# Patient Record
Sex: Female | Born: 1960 | Race: Black or African American | Hispanic: No | Marital: Married | Smoking: Never smoker
Health system: Southern US, Community
[De-identification: ages and names within clinical notes are randomized; demographics above are authoritative.]

## PROBLEM LIST (undated history)

## (undated) DIAGNOSIS — F32A Depression, unspecified: Secondary | ICD-10-CM

## (undated) DIAGNOSIS — E079 Disorder of thyroid, unspecified: Secondary | ICD-10-CM

## (undated) DIAGNOSIS — M1990 Unspecified osteoarthritis, unspecified site: Secondary | ICD-10-CM

## (undated) DIAGNOSIS — R519 Headache, unspecified: Secondary | ICD-10-CM

## (undated) DIAGNOSIS — E785 Hyperlipidemia, unspecified: Secondary | ICD-10-CM

## (undated) DIAGNOSIS — N189 Chronic kidney disease, unspecified: Secondary | ICD-10-CM

## (undated) DIAGNOSIS — R0600 Dyspnea, unspecified: Secondary | ICD-10-CM

## (undated) DIAGNOSIS — E039 Hypothyroidism, unspecified: Secondary | ICD-10-CM

## (undated) DIAGNOSIS — I10 Essential (primary) hypertension: Secondary | ICD-10-CM

## (undated) DIAGNOSIS — I82409 Acute embolism and thrombosis of unspecified deep veins of unspecified lower extremity: Secondary | ICD-10-CM

## (undated) DIAGNOSIS — O24419 Gestational diabetes mellitus in pregnancy, unspecified control: Secondary | ICD-10-CM

## (undated) DIAGNOSIS — H35039 Hypertensive retinopathy, unspecified eye: Secondary | ICD-10-CM

## (undated) DIAGNOSIS — D649 Anemia, unspecified: Secondary | ICD-10-CM

## (undated) DIAGNOSIS — D219 Benign neoplasm of connective and other soft tissue, unspecified: Secondary | ICD-10-CM

## (undated) HISTORY — DX: Benign neoplasm of connective and other soft tissue, unspecified: D21.9

## (undated) HISTORY — DX: Essential (primary) hypertension: I10

## (undated) HISTORY — DX: Hypertensive retinopathy, unspecified eye: H35.039

## (undated) HISTORY — DX: Hyperlipidemia, unspecified: E78.5

## (undated) HISTORY — PX: TUBAL LIGATION: SHX77

## (undated) HISTORY — DX: Gestational diabetes mellitus in pregnancy, unspecified control: O24.419

## (undated) HISTORY — DX: Disorder of thyroid, unspecified: E07.9

## (undated) HISTORY — PX: MYOMECTOMY: SHX85

## (undated) HISTORY — DX: Acute embolism and thrombosis of unspecified deep veins of unspecified lower extremity: I82.409

## (undated) HISTORY — DX: Anemia, unspecified: D64.9

## (undated) HISTORY — DX: Unspecified osteoarthritis, unspecified site: M19.90

---

## 1998-07-31 ENCOUNTER — Other Ambulatory Visit: Admission: RE | Admit: 1998-07-31 | Discharge: 1998-07-31 | Payer: Self-pay | Admitting: Obstetrics & Gynecology

## 1999-08-20 ENCOUNTER — Other Ambulatory Visit: Admission: RE | Admit: 1999-08-20 | Discharge: 1999-08-20 | Payer: Self-pay | Admitting: Obstetrics & Gynecology

## 1999-11-16 ENCOUNTER — Emergency Department (HOSPITAL_COMMUNITY): Admission: EM | Admit: 1999-11-16 | Discharge: 1999-11-16 | Payer: Self-pay | Admitting: Emergency Medicine

## 1999-11-24 ENCOUNTER — Encounter: Payer: Self-pay | Admitting: Family Medicine

## 1999-11-24 ENCOUNTER — Ambulatory Visit (HOSPITAL_COMMUNITY): Admission: RE | Admit: 1999-11-24 | Discharge: 1999-11-24 | Payer: Self-pay | Admitting: Family Medicine

## 2000-11-15 ENCOUNTER — Other Ambulatory Visit: Admission: RE | Admit: 2000-11-15 | Discharge: 2000-11-15 | Payer: Self-pay | Admitting: Obstetrics & Gynecology

## 2001-11-22 ENCOUNTER — Other Ambulatory Visit: Admission: RE | Admit: 2001-11-22 | Discharge: 2001-11-22 | Payer: Self-pay | Admitting: Obstetrics & Gynecology

## 2003-07-31 ENCOUNTER — Other Ambulatory Visit: Admission: RE | Admit: 2003-07-31 | Discharge: 2003-07-31 | Payer: Self-pay | Admitting: Obstetrics & Gynecology

## 2004-01-22 ENCOUNTER — Other Ambulatory Visit: Admission: RE | Admit: 2004-01-22 | Discharge: 2004-01-22 | Payer: Self-pay | Admitting: Obstetrics & Gynecology

## 2004-09-05 ENCOUNTER — Other Ambulatory Visit: Admission: RE | Admit: 2004-09-05 | Discharge: 2004-09-05 | Payer: Self-pay | Admitting: Obstetrics & Gynecology

## 2005-05-09 ENCOUNTER — Inpatient Hospital Stay (HOSPITAL_COMMUNITY): Admission: EM | Admit: 2005-05-09 | Discharge: 2005-05-11 | Payer: Self-pay | Admitting: Emergency Medicine

## 2005-06-15 DIAGNOSIS — I82409 Acute embolism and thrombosis of unspecified deep veins of unspecified lower extremity: Secondary | ICD-10-CM

## 2005-06-15 HISTORY — DX: Acute embolism and thrombosis of unspecified deep veins of unspecified lower extremity: I82.409

## 2005-09-01 ENCOUNTER — Encounter: Admission: RE | Admit: 2005-09-01 | Discharge: 2005-09-01 | Payer: Self-pay | Admitting: Internal Medicine

## 2006-06-23 ENCOUNTER — Ambulatory Visit (HOSPITAL_COMMUNITY): Admission: RE | Admit: 2006-06-23 | Discharge: 2006-06-23 | Payer: Self-pay | Admitting: Obstetrics & Gynecology

## 2010-10-06 ENCOUNTER — Other Ambulatory Visit (HOSPITAL_COMMUNITY)
Admission: RE | Admit: 2010-10-06 | Discharge: 2010-10-06 | Disposition: A | Discharge status: Home / Self Care | Payer: Self-pay | Source: Ambulatory Visit | Attending: Family Medicine | Admitting: Family Medicine

## 2010-10-06 ENCOUNTER — Encounter: Payer: Self-pay | Admitting: Family Medicine

## 2010-10-06 ENCOUNTER — Other Ambulatory Visit: Payer: Self-pay | Admitting: Family Medicine

## 2010-10-06 ENCOUNTER — Encounter (INDEPENDENT_AMBULATORY_CARE_PROVIDER_SITE_OTHER): Payer: Self-pay | Admitting: Physician Assistant

## 2010-10-06 ENCOUNTER — Other Ambulatory Visit: Payer: Self-pay | Admitting: Physician Assistant

## 2010-10-06 DIAGNOSIS — R87619 Unspecified abnormal cytological findings in specimens from cervix uteri: Secondary | ICD-10-CM | POA: Insufficient documentation

## 2010-10-06 DIAGNOSIS — N879 Dysplasia of cervix uteri, unspecified: Secondary | ICD-10-CM

## 2010-10-06 LAB — POCT PREGNANCY, URINE: Preg Test, Ur: NEGATIVE

## 2010-10-07 NOTE — Group Therapy Note (Signed)
NAMEWILENE, Heather Bailey                   ACCOUNT NO.:  [id number]  MEDICAL RECORD NO.:  [id number]           PATIENT TYPE:  A  LOCATION:  [facility]                   FACILITY:  [facility]  PHYSICIAN:  Tinnie Gens, MD        DATE OF BIRTH:  08-01-1960  DATE OF SERVICE:                                 CLINIC NOTE  CHIEF COMPLAINT:  Cervical mass.  HISTORY OF PRESENT ILLNESS:  The patient is 50 year old gravida 2, para 2-0-0-2 who was [facility].  At that time, she was found to have a cervical mass that was felt to need a further followup and biopsy.  The patient has a remote history of abnormal Pap smear that may or may not have included removal of some part of her cervix.  Otherwise, she has been without complaints.  She is perimenopausal.  She is having hot flashes and it has been 8 months since her last period.  PAST MEDICAL HISTORY: 1. Arthritis. 2. Anemia. 3. Hypertension. 4. History of DVT/PE. 5. Hypothyroidism.  PAST SURGICAL HISTORY:  The patient has undergone C-section, BTL, Novasure ablation, and another procedure to remove fibroids.  I am unclear if this was done through abdominal incision or if she had a hysteroscopic removal.  MEDICATIONS: 1. Levothyroxine 0.75 mg p.o. daily. 2. Bisoprolol 5 mg 1 p.o. daily. 3. Centrum multivitamin 1 p.o. daily. 4. 5-HTP 100 mg p.o. daily.  ALLERGIES: 1. HOT DOGS. 2. ONIONS. 3. CABBAGE. 4. BACON.  OBSTETRICAL HISTORY:  She is a G2, P2 with one C section with tubal ligation in 1997.  GYNECOLOGICAL HISTORY:  Menarche at age 57.  Cycles are regular, are irregular now, it has been over 8 months since her last period.  She has had severe pain and heavy cycles most of her life that tapered off after her Novasure in 2009.  She does have a history of ovarian cyst and fibroids and some procedure to treat abnormal Pap smear.  SOCIAL HISTORY:  She does not smoke, she does not do drugs, or any other alcohol.  She  lives with her husband and two sons.  She has recently been unemployed.  FAMILY HISTORY:  Significant for heart disease in her father who died of heart attack.  Hypertension in her mother and sister.  Her mother died of ovarian cancer.  She and her sister both have a history of blood clots.  REVIEW OF SYSTEMS:  Fourteen point review of systems was reviewed. Please see GYN history in the chart.  Positive for weight gain, menopausal symptoms, and hot flashes which has been going on for approximately 4-5 years, depression, anxiety, muscle and joint pain, leg cramps, and painful intercourse.  PHYSICAL EXAMINATION:  VITAL SIGNS:  Her vitals are as noted in the chart.  Her weight is 280 pounds, blood pressure 124/93. GENERAL:  She is a well-developed, well-nourished female in no acute distress. HEENT:  Normocephalic and atraumatic.  Sclerae anicteric. NECK:  Supple.  Normal thyroid. LUNGS:  Respirations appear normal. HEART:  Rate is regular. ABDOMEN:  Soft.  PROCEDURE:  The patient was placed in  dorsal lithotomy and speculum was placed inside the vagina.  Cervix was visualized.  There was a very large ectropion noted.  There was a nabothian cyst at approximately 10 o'clock and 8 o'clock.  There are finger-like projections, erythematous, polypoid looking, and structure of this looks different than everything else.  A colposcopy was performed by applying acetic acid to the cervix. There were multiple places of acetowhite changes and some abnormal blood vessels.  Biopsy was done in all 4 quadrants to include the mass.  There was very little bleeding with good tissue sampling done as the cervix was very hard, like it is potentially scarred.  ECC was performed. Monsel was used to obtain hemostasis.  Please note, also that the patient's Pap smear at [facility] was normal.  IMPRESSION:  Cervical mass of unclear etiology.  PLAN:  Biopsies today with cold blade.  She will  follow up in 2 weeks for a planned followup in 2 weeks for path.          ______________________________ Tinnie Gens, MD    TP/MEDQ  D:  10/06/2010  T:  10/07/2010  Job:  [id number]

## 2010-10-20 ENCOUNTER — Other Ambulatory Visit (HOSPITAL_COMMUNITY): Payer: Self-pay | Admitting: Internal Medicine

## 2010-10-20 DIAGNOSIS — Z1231 Encounter for screening mammogram for malignant neoplasm of breast: Secondary | ICD-10-CM

## 2010-10-24 ENCOUNTER — Ambulatory Visit: Payer: Self-pay | Admitting: Family Medicine

## 2010-10-24 ENCOUNTER — Ambulatory Visit (HOSPITAL_COMMUNITY)
Admission: RE | Admit: 2010-10-24 | Discharge: 2010-10-24 | Disposition: A | Discharge status: Home / Self Care | Payer: Self-pay | Source: Ambulatory Visit | Attending: Internal Medicine | Admitting: Internal Medicine

## 2010-10-24 DIAGNOSIS — Z1231 Encounter for screening mammogram for malignant neoplasm of breast: Secondary | ICD-10-CM

## 2011-06-28 ENCOUNTER — Ambulatory Visit: Payer: Self-pay

## 2011-06-28 DIAGNOSIS — M545 Low back pain, unspecified: Secondary | ICD-10-CM

## 2011-08-18 ENCOUNTER — Ambulatory Visit (INDEPENDENT_AMBULATORY_CARE_PROVIDER_SITE_OTHER): Payer: Self-pay | Admitting: *Deleted

## 2011-08-18 VITALS — BP 123/84 | HR 70 | Temp 97.9°F | Ht 68.0 in | Wt 206.1 lb

## 2011-08-18 DIAGNOSIS — Z01419 Encounter for gynecological examination (general) (routine) without abnormal findings: Secondary | ICD-10-CM

## 2011-08-18 NOTE — Patient Instructions (Signed)
Taught patient how to perform BSE and gave educational materials to take home. Patient did not need a mammogram today for has not been 1 year since last mammogram. Informed patient to call me to schedule her yearly mammogram. Told her that her next mammogram is due 10/25/11. Let patient know will follow up with her within the next couple weeks with results by letter or phone. Patient verbalized understanding.

## 2011-08-18 NOTE — Progress Notes (Signed)
No complaints today.  Pap Smear:    Completed Pap smear today. Last Pap smear was 1 year at one of the free Pap smear screenings at the [facility]. Last Pap smear was abnormal requiring a colposcopy and biopsy for follow up. Per patient has had another abnormal Pap smear years ago. No Pap smear results in EPIC.  Physical exam: Breasts Breasts symmetrical. No skin abnormalities bilateral breasts. No nipple retraction bilateral breasts. No nipple discharge bilateral breasts. No lymphadenopathy. No lumps palpated bilateral breasts. No complaints of pain or tenderness on palpation.          Pelvic/Bimanual   Ext Genitalia No lesions, no swelling and no discharge observed on external genitalia.         Vagina Vagina pink and normal texture. No lesions or discharge observed in vagina.          Cervix Cervix is present. Cervix is rough and yellowish appearing. No discharge observed at cervical os.          Uterus Uterus is present and palpable. Uterus in normal position and normal size.       Adnexae Bilateral ovaries present and palpable. No tenderness on palpation.        Rectovaginal No rectal exam completed today since patient had no rectal complaints. No skin abnormalities observed at rectal area.

## 2011-08-26 ENCOUNTER — Encounter: Payer: Self-pay | Admitting: Obstetrics and Gynecology

## 2011-10-20 ENCOUNTER — Telehealth: Payer: Self-pay

## 2011-10-20 NOTE — Telephone Encounter (Signed)
Called pt and left message to return our call to Christine Brannock @ [phone].  

## 2011-10-23 ENCOUNTER — Telehealth: Payer: Self-pay | Admitting: *Deleted

## 2011-10-23 NOTE — Telephone Encounter (Signed)
Telephoned patient's home # and left message.

## 2011-10-28 ENCOUNTER — Telehealth: Payer: Self-pay | Admitting: *Deleted

## 2011-10-28 ENCOUNTER — Encounter: Payer: Self-pay | Admitting: Obstetrics and Gynecology

## 2011-10-28 NOTE — Telephone Encounter (Signed)
Patient returned call and left voicemail. Attempted to call patient back to remind her to schedule her mammogram. No one answered phone. This is third attempt to call patient. Will send certified letter.

## 2011-10-30 ENCOUNTER — Telehealth: Payer: Self-pay | Admitting: *Deleted

## 2011-10-30 ENCOUNTER — Other Ambulatory Visit: Payer: Self-pay | Admitting: Obstetrics and Gynecology

## 2011-10-30 DIAGNOSIS — Z1231 Encounter for screening mammogram for malignant neoplasm of breast: Secondary | ICD-10-CM

## 2011-10-30 NOTE — Telephone Encounter (Signed)
Patient returned phone call and left me voicemail. Called patient back and let her know she needs to schedule a screening mammogram. [facility] mammography and scheduled mammogram for patient per request. Appointment is Thursday, November 26, 2011 at 1600. Called back back and gave her appointment. Let patient know to show her pink [facility] card when comes to appointment and that [facility] covers the mammogram. Patient verbalized understanding.

## 2011-11-26 ENCOUNTER — Ambulatory Visit (HOSPITAL_COMMUNITY)
Admission: RE | Admit: 2011-11-26 | Discharge: 2011-11-26 | Disposition: A | Discharge status: Home / Self Care | Payer: Self-pay | Source: Ambulatory Visit | Attending: Obstetrics and Gynecology | Admitting: Obstetrics and Gynecology

## 2011-11-26 DIAGNOSIS — Z1231 Encounter for screening mammogram for malignant neoplasm of breast: Secondary | ICD-10-CM

## 2012-03-09 ENCOUNTER — Telehealth (HOSPITAL_COMMUNITY): Payer: Self-pay | Admitting: *Deleted

## 2012-03-09 NOTE — Telephone Encounter (Signed)
Patient called me and left voicemail late yesterday afternoon in regards to a [facility] qeustion. Called patient back and no one answered phone. Left voicemail for patient to call me back.

## 2012-11-14 ENCOUNTER — Other Ambulatory Visit: Payer: Self-pay | Admitting: Obstetrics and Gynecology

## 2012-11-14 DIAGNOSIS — Z1231 Encounter for screening mammogram for malignant neoplasm of breast: Secondary | ICD-10-CM

## 2012-11-29 ENCOUNTER — Ambulatory Visit (HOSPITAL_COMMUNITY)
Admission: RE | Admit: 2012-11-29 | Discharge: 2012-11-29 | Disposition: A | Discharge status: Home / Self Care | Payer: Self-pay | Source: Ambulatory Visit | Attending: Obstetrics and Gynecology | Admitting: Obstetrics and Gynecology

## 2012-11-29 ENCOUNTER — Encounter (HOSPITAL_COMMUNITY): Payer: Self-pay

## 2012-11-29 VITALS — BP 110/78 | Temp 98.1°F | Ht 67.0 in | Wt 195.2 lb

## 2012-11-29 DIAGNOSIS — Z01419 Encounter for gynecological examination (general) (routine) without abnormal findings: Secondary | ICD-10-CM

## 2012-11-29 DIAGNOSIS — Z1231 Encounter for screening mammogram for malignant neoplasm of breast: Secondary | ICD-10-CM

## 2012-11-29 NOTE — Patient Instructions (Signed)
Taught Geoffrey Mankin how to perform BSE and gave educational materials to take home. Due to patients history of abnormal Pap smears depending on today's result will determine when next Pap smear is due.  Let patient know will follow up with her within the next couple weeks with results by letter or phone for mammogram and Pap smear. Heather Bailey verbalized understanding. Patient escorted to mammography for a screening mammogram.  Monzerat Handler, Kathaleen Maser, RN 12:14 PM

## 2012-11-29 NOTE — Addendum Note (Signed)
Encounter addended by: Saintclair Halsted, RN on: 11/29/2012 12:43 PM<BR>     Documentation filed: Visit Diagnoses

## 2012-11-29 NOTE — Progress Notes (Signed)
No complaints today.  Pap Smear:    Completed Pap smear today. Last Pap smear was 08/18/2011 at [facility] and normal. Patient had an abnormal Pap smear 10/06/2010 that required a colposcopy for follow up. Per patient has had around 3 other abnormal Pap smears prior to the abnormal Pap smear in 2012. Patient stated she has a history of cryo and coloposcopy prior to last abnormal Pap smear. Last Pap smear result is in EPIC.  Physical exam: Breasts Breasts symmetrical. No skin abnormalities bilateral breasts. No nipple retraction bilateral breasts. No nipple discharge bilateral breasts. No lymphadenopathy. No lumps palpated bilateral breasts. No complaints of pain or tenderness on exam. Patient escorted to mammography for a screening mammogram.          Pelvic/Bimanual   Ext Genitalia No lesions, no swelling and no discharge observed on external genitalia.         Vagina Vagina pink and normal texture. No lesions or discharge observed in vagina.          Cervix Cervix is present. Cervix tilted to the right. Cervix is red, bumpy and irregular around cervical os, ? HPV. No discharge observed on cervix.       Uterus Uterus is present and palpable. Uterus is enlarged and positioned to the right. Patient stated she has a history of uterine fibroids.    Adnexae Bilateral ovaries present and unable to palpate. No tenderness on palpation.        Rectovaginal No rectal exam completed today since patient had no rectal complaints. No skin abnormalities observed on exam.

## 2012-12-05 ENCOUNTER — Telehealth (HOSPITAL_COMMUNITY): Payer: Self-pay | Admitting: *Deleted

## 2012-12-05 NOTE — Telephone Encounter (Signed)
Telephoned patient at home # and discussed results of negative pap smear and next pap due in 3 years. Patient voiced understanding

## 2013-03-10 ENCOUNTER — Emergency Department (HOSPITAL_COMMUNITY)
Admission: EM | Admit: 2013-03-10 | Discharge: 2013-03-10 | Disposition: A | Discharge status: Home / Self Care | Payer: Self-pay | Attending: Emergency Medicine | Admitting: Emergency Medicine

## 2013-03-10 ENCOUNTER — Emergency Department (HOSPITAL_COMMUNITY): Payer: Self-pay

## 2013-03-10 ENCOUNTER — Encounter (HOSPITAL_COMMUNITY): Payer: Self-pay | Admitting: Emergency Medicine

## 2013-03-10 DIAGNOSIS — R079 Chest pain, unspecified: Secondary | ICD-10-CM

## 2013-03-10 DIAGNOSIS — R071 Chest pain on breathing: Secondary | ICD-10-CM | POA: Insufficient documentation

## 2013-03-10 DIAGNOSIS — R51 Headache: Secondary | ICD-10-CM | POA: Insufficient documentation

## 2013-03-10 DIAGNOSIS — Z8742 Personal history of other diseases of the female genital tract: Secondary | ICD-10-CM | POA: Insufficient documentation

## 2013-03-10 DIAGNOSIS — Z862 Personal history of diseases of the blood and blood-forming organs and certain disorders involving the immune mechanism: Secondary | ICD-10-CM | POA: Insufficient documentation

## 2013-03-10 DIAGNOSIS — E785 Hyperlipidemia, unspecified: Secondary | ICD-10-CM | POA: Insufficient documentation

## 2013-03-10 DIAGNOSIS — M436 Torticollis: Secondary | ICD-10-CM | POA: Insufficient documentation

## 2013-03-10 DIAGNOSIS — I10 Essential (primary) hypertension: Secondary | ICD-10-CM | POA: Insufficient documentation

## 2013-03-10 DIAGNOSIS — Z79899 Other long term (current) drug therapy: Secondary | ICD-10-CM | POA: Insufficient documentation

## 2013-03-10 DIAGNOSIS — Z86718 Personal history of other venous thrombosis and embolism: Secondary | ICD-10-CM | POA: Insufficient documentation

## 2013-03-10 DIAGNOSIS — E079 Disorder of thyroid, unspecified: Secondary | ICD-10-CM | POA: Insufficient documentation

## 2013-03-10 DIAGNOSIS — Z8739 Personal history of other diseases of the musculoskeletal system and connective tissue: Secondary | ICD-10-CM | POA: Insufficient documentation

## 2013-03-10 LAB — CBC
HCT: 35.2 % — ABNORMAL LOW (ref 36.0–46.0)
Hemoglobin: 11.7 g/dL — ABNORMAL LOW (ref 12.0–15.0)
MCH: 29.3 pg (ref 26.0–34.0)
MCHC: 33.2 g/dL (ref 30.0–36.0)
MCV: 88.2 fL (ref 78.0–100.0)
Platelets: 248 10*3/uL (ref 150–400)
RBC: 3.99 MIL/uL (ref 3.87–5.11)
RDW: 13.5 % (ref 11.5–15.5)
WBC: 6.6 10*3/uL (ref 4.0–10.5)

## 2013-03-10 LAB — BASIC METABOLIC PANEL
BUN: 14 mg/dL (ref 6–23)
CO2: 27 mEq/L (ref 19–32)
Calcium: 9.5 mg/dL (ref 8.4–10.5)
Chloride: 103 mEq/L (ref 96–112)
Creatinine, Ser: 1.16 mg/dL — ABNORMAL HIGH (ref 0.50–1.10)
GFR calc Af Amer: 62 mL/min — ABNORMAL LOW (ref >90)
GFR calc non Af Amer: 53 mL/min — ABNORMAL LOW (ref >90)
Glucose, Bld: 89 mg/dL (ref 70–99)
Potassium: 4.2 mEq/L (ref 3.5–5.1)
Sodium: 140 mEq/L (ref 135–145)

## 2013-03-10 LAB — POCT I-STAT TROPONIN I: Troponin i, poc: 0 ng/mL (ref 0.00–0.08)

## 2013-03-10 LAB — D-DIMER, QUANTITATIVE: D-Dimer, Quant: 0.44 ug/mL-FEU (ref 0.00–0.48)

## 2013-03-10 MED ORDER — NITROGLYCERIN 0.4 MG SL SUBL: 0.4000 mg | SUBLINGUAL_TABLET | SUBLINGUAL | Status: DC | PRN

## 2013-03-10 MED ORDER — ASPIRIN 325 MG PO TABS
325.0000 mg | ORAL_TABLET | ORAL | Status: AC
  Administered 2013-03-10: 325 mg via ORAL
  Filled 2013-03-10: qty 1

## 2013-03-10 MED ORDER — SODIUM CHLORIDE 0.9 % IV SOLN
Freq: Once | INTRAVENOUS | Status: AC
  Administered 2013-03-10: 21:00:00 via INTRAVENOUS

## 2013-03-10 MED ORDER — KETOROLAC TROMETHAMINE 30 MG/ML IJ SOLN
30.0000 mg | Freq: Once | INTRAMUSCULAR | Status: AC
  Administered 2013-03-10: 30 mg via INTRAVENOUS
  Filled 2013-03-10: qty 1

## 2013-03-10 MED ORDER — ONDANSETRON HCL 4 MG/2ML IJ SOLN
4.0000 mg | Freq: Once | INTRAMUSCULAR | Status: AC
  Administered 2013-03-10: 4 mg via INTRAVENOUS
  Filled 2013-03-10: qty 2

## 2013-03-10 MED ORDER — HYDROCODONE-ACETAMINOPHEN 7.5-325 MG/15ML PO SOLN: 15.0000 mL | Freq: Four times a day (QID) | ORAL | Status: AC | PRN

## 2013-03-10 NOTE — ED Notes (Signed)
X-Ray at bedside.

## 2013-03-10 NOTE — ED Notes (Signed)
MD at bedside. Dr. Lockwood at bedside.  

## 2013-03-10 NOTE — ED Provider Notes (Signed)
CSN: [id number]     Arrival date & time 03/10/13  1905 History   First MD Initiated Contact with Patient 03/10/13 2000     Chief Complaint  Patient presents with  . Chest Pain  . Torticollis  . Headache   (Consider location/radiation/quality/duration/timing/severity/associated sxs/prior Treatment) HPI Patient presents with concern chest pain and headache, nausea, sore throat. Symptoms began yesterday, without clear precipitant.  Initially there was left sided chest pressure.  Since onset the pressure has been persistent.  The aforementioned other symptoms have developed gradually.  There is subjective fever, generalized discomfort, but no vomiting, no diarrhea, no objective fever. Patient denies lower extremity changes, but endorses a history of DVT in the distant past.  She states that this is her primary concern. No relief with OTC medication. Past Medical History  Diagnosis Date  . Thyroid disease   . Hypertension   . Arthritis   . Anemia   . Hyperlipidemia   . DVT (deep venous thrombosis)   . Fibroids    Past Surgical History  Procedure Laterality Date  . Cesarean section    . Myomectomy    . Tubal ligation     Family History  Problem Relation Age of Onset  . Heart disease Father   . Hypertension Mother   . Cancer Mother     colon/cervical   History  Substance Use Topics  . Smoking status: Never Smoker   . Smokeless tobacco: Never Used  . Alcohol Use: No   OB History   Grav Para Term Preterm Abortions TAB SAB Ect Mult Living   2 2 2       2      Review of Systems  Constitutional:       Per HPI, otherwise negative  HENT:       Per HPI, otherwise negative  Respiratory:       Per HPI, otherwise negative  Cardiovascular:       Per HPI, otherwise negative  Gastrointestinal: Negative for vomiting.  Endocrine:       Negative aside from HPI  Genitourinary:       Neg aside from HPI   Musculoskeletal:       Per HPI, otherwise negative  Skin: Negative.    Neurological: Negative for syncope.    Allergies  Zithromax  Home Medications   Current Outpatient Rx  Name  Route  Sig  Dispense  Refill  . bisoprolol (ZEBETA) 5 MG tablet   Oral   Take 5 mg by mouth daily.         Marland Kitchen glucosamine-chondroitin 500-400 MG tablet   Oral   Take 3 tablets by mouth daily.         Marland Kitchen ibuprofen (ADVIL,MOTRIN) 200 MG tablet   Oral   Take 200 mg by mouth every 6 (six) hours as needed for pain.         Marland Kitchen levothyroxine (SYNTHROID, LEVOTHROID) 50 MCG tablet   Oral   Take 50 mcg by mouth daily.         . pseudoephedrine (SUDAFED) 30 MG tablet   Oral   Take 30 mg by mouth every 4 (four) hours as needed for congestion.         . sertraline (ZOLOFT) 50 MG tablet   Oral   Take 50 mg by mouth daily.          BP 132/79  Pulse 76  Temp(Src) 99.8 F (37.7 C) (Oral)  Resp 18  Ht 5\' 7"  (1.702  m)  Wt 185 lb (83.915 kg)  BMI 28.97 kg/m2  SpO2 99% Physical Exam  Nursing note and vitals reviewed. Constitutional: She is oriented to person, place, and time. She appears well-developed and well-nourished. No distress.  HENT:  Head: Normocephalic and atraumatic.  Mouth/Throat: Uvula is midline, oropharynx is clear and moist and mucous membranes are normal.  Eyes: Conjunctivae and EOM are normal.  Cardiovascular: Normal rate and regular rhythm.   Pulmonary/Chest: Effort normal and breath sounds normal. No stridor. No respiratory distress.  Abdominal: She exhibits no distension.  Musculoskeletal: She exhibits no edema.  Lymphadenopathy:       Head (right side): No submental, no submandibular, no preauricular, no posterior auricular and no occipital adenopathy present.       Head (left side): No submental, no submandibular, no preauricular, no posterior auricular and no occipital adenopathy present.       Right cervical: No superficial cervical adenopathy present.      Left cervical: No superficial cervical adenopathy present.  Neurological: She  is alert and oriented to person, place, and time. No cranial nerve deficit.  Skin: Skin is warm and dry.  Psychiatric: She has a normal mood and affect.    ED Course  Procedures (including critical care time) Labs Review Labs Reviewed  CBC - Abnormal; Notable for the following:    Hemoglobin 11.7 (*)    HCT 35.2 (*)    All other components within normal limits  BASIC METABOLIC PANEL  D-DIMER, QUANTITATIVE  POCT I-STAT TROPONIN I   Imaging Review Dg Chest Port 1 View  03/10/2013   CLINICAL DATA:  Patient having chest tightness since yesterday, worsening today, radiating to both arms and to the neck with associated nausea, diaphoresis and lightheadedness.  EXAM: PORTABLE CHEST - 1 VIEW  COMPARISON:  05/09/2005  FINDINGS: The heart size and mediastinal contours are within normal limits. Both lungs are clear. The visualized skeletal structures are unremarkable.  IMPRESSION: No active disease.   Electronically Signed   By: Amie Portland   On: 03/10/2013 19:58   Pulse oximetry 100% room air normal Cardiac 75 sinus rhythm normal EKG has a sinus rhythm, rate 80, unremarkable   10:54 PM On repeat exam the patient appears calm.  I discussed all results with her and her husband.  Given the duration of symptoms, her negative troponin likely reflects the absence of ongoing coronary ischemia. MDM  No diagnosis found. Patient presents with one day of chest discomfort, headache, neck discomfort, cough, subjective fever.  Patient's description of symptoms is consistent with viral etiology, though with her history, labs, x-ray, were performed.  Results were largely reassuring, including a negative d-dimer, unremarkable troponin.  Given these findings, the patient's absence of hypoxia, tachypnea, tachycardia, there is low suspicion for PE, ACS, occult pneumonia. I had a lengthy discussion with the patient and her husband about home care, followup instructions and return precautions.    Gerhard Munch, MD 03/10/13 2256

## 2013-03-10 NOTE — ED Notes (Signed)
Pt states pain to L chest is worse with mvmt and inspiration. Pt states she is not having chest pain now, but her neck hurts. Pt states she had shortness of breath earlier, but it is better now. Pt states nausea from earlier is also improved.

## 2013-03-10 NOTE — ED Notes (Addendum)
Pt states that she began having chest tightness yesterday and it has gotten worse today. Pt c/o left chest pain that radiates to both arms and the neck. Pt also c/o nausea, lightheadedness, diaphoresis, and headache accompanied by severe neck pain and stiffness. Pt states she has a hx of blood clots and had the same s/s the last time.

## 2013-11-02 ENCOUNTER — Other Ambulatory Visit (HOSPITAL_COMMUNITY): Payer: Self-pay | Admitting: Internal Medicine

## 2014-01-18 ENCOUNTER — Other Ambulatory Visit: Payer: Self-pay | Admitting: Obstetrics and Gynecology

## 2014-01-18 DIAGNOSIS — Z1231 Encounter for screening mammogram for malignant neoplasm of breast: Secondary | ICD-10-CM

## 2014-02-13 ENCOUNTER — Ambulatory Visit (HOSPITAL_COMMUNITY)
Admission: RE | Admit: 2014-02-13 | Discharge: 2014-02-13 | Disposition: A | Discharge status: Home / Self Care | Payer: Self-pay | Source: Ambulatory Visit | Attending: Obstetrics and Gynecology | Admitting: Obstetrics and Gynecology

## 2014-02-13 ENCOUNTER — Encounter (HOSPITAL_COMMUNITY): Payer: Self-pay

## 2014-02-13 VITALS — BP 118/84 | Temp 98.4°F | Ht 67.0 in | Wt 213.0 lb

## 2014-02-13 DIAGNOSIS — Z1231 Encounter for screening mammogram for malignant neoplasm of breast: Secondary | ICD-10-CM

## 2014-02-13 DIAGNOSIS — Z01419 Encounter for gynecological examination (general) (routine) without abnormal findings: Secondary | ICD-10-CM

## 2014-02-13 NOTE — Patient Instructions (Signed)
Explained to Heather Bailey how to perform BSE due to patients history of abnormal Pap smears depending on today's result will determine when next Pap smear is due. Let patient know will follow up with her within the next couple weeks with results by letter or phone for mammogram and Pap smear. Heather Bailey verbalized understanding. Patient escorted to mammography for a screening mammogram.   Arham Symmonds, Arvil Chaco, RN

## 2014-02-13 NOTE — Progress Notes (Signed)
No complaints today.   Pap Smear: Completed Pap smear today. Last Pap smear was 11/29/2012 at [facility] and normal. Patients prior Pap smear on 08/18/2011 at [facility] was normal. Patient had an abnormal Pap smear in 2012 that required a colposcopy for follow up 10/06/2010. Per patient has had around 3 other abnormal Pap smears prior to the abnormal Pap smear in 2012. Patient stated she has a history of cryo and coloposcopy prior for last abnormal Pap smear. Last Pap smear result is in EPIC.   Physical exam:  Breasts  Breasts symmetrical. No skin abnormalities bilateral breasts. No nipple retraction bilateral breasts. No nipple discharge bilateral breasts. No lymphadenopathy. No lumps palpated bilateral breasts. No complaints of pain or tenderness on exam. Patient escorted to mammography for a screening mammogram.   Pelvic/Bimanual  Ext Genitalia  No lesions, no swelling and no discharge observed on external genitalia.   Vagina  Vagina pink and normal texture. No lesions or discharge observed in vagina.   Cervix  Cervix is present. Cervix tilted to the right. Cervix is red, bumpy and irregular around cervical os, ? HPV. No discharge observed on cervix.   Uterus  Uterus is present and palpable. Uterus is enlarged and positioned to the right. Patient stated she has a history of uterine fibroids.   Adnexae  Bilateral ovaries present and unable to palpate. No tenderness on palpation.   Rectovaginal  No rectal exam completed today since patient had no rectal complaints. No skin abnormalities observed on exam.

## 2014-02-13 NOTE — Addendum Note (Signed)
Encounter addended by: Loletta Parish, RN on: 02/13/2014 10:49 AM<BR>     Documentation filed: Visit Diagnoses

## 2014-02-14 LAB — CYTOLOGY - PAP

## 2014-02-16 ENCOUNTER — Telehealth (HOSPITAL_COMMUNITY): Payer: Self-pay | Admitting: *Deleted

## 2014-02-16 NOTE — Telephone Encounter (Signed)
Telephoned patient at home # and discussed negative pap smear results. Next pap due in 3 years. Patient voiced understanding.

## 2014-03-02 ENCOUNTER — Ambulatory Visit: Payer: Self-pay

## 2014-03-09 ENCOUNTER — Ambulatory Visit (HOSPITAL_BASED_OUTPATIENT_CLINIC_OR_DEPARTMENT_OTHER): Payer: Self-pay

## 2014-03-09 ENCOUNTER — Other Ambulatory Visit: Payer: Self-pay

## 2014-03-09 VITALS — BP 119/74 | HR 66 | Temp 98.2°F | Resp 16 | Ht 66.0 in | Wt 215.7 lb

## 2014-03-09 DIAGNOSIS — Z0000 Encounter for general adult medical examination without abnormal findings: Secondary | ICD-10-CM

## 2014-03-09 LAB — HEMOGLOBIN A1C
HEMOGLOBIN A1C: 6.5 % — AB (ref <5.7)
MEAN PLASMA GLUCOSE: 140 mg/dL — AB (ref <117)

## 2014-03-09 LAB — LIPID PANEL
CHOL/HDL RATIO: 5.8 ratio
Cholesterol: 222 mg/dL — ABNORMAL HIGH (ref 0–200)
HDL: 38 mg/dL — ABNORMAL LOW (ref >39)
LDL CALC: 155 mg/dL — AB (ref 0–99)
Triglycerides: 146 mg/dL (ref <150)
VLDL: 29 mg/dL (ref 0–40)

## 2014-03-09 LAB — GLUCOSE (CC13): Glucose: 83 mg/dl (ref 70–140)

## 2014-03-09 NOTE — Progress Notes (Signed)
Patient is a new patient to the [facility] program and is currently a [facility] patient effective 02/13/2014.   Clinical Measurements: Patient is 5 ft. 6 inches, weight 215.7 lbs, waist circumference 39 inches, and hip circumference 44.5 inches.   Medical History: Patient has no history of high cholesterol or diabetes.Patient does have a history of hypertension. She is presently on  5 mg bisoprolol everyday.  Per patient no diagnosed history of coronary heart disease, heart attack, heart failure, stroke/TIA, vascular disease or congenital heart defects. Per patient has had chest pain secondary to multiple Pulmonary emboli.  Blood Pressure, Self-measurement: Patient states that does check blood pressure on occasion and does share results with doctor.  Nutrition Assessment: Patient stated that eats 1 fruit per month but states she loves fruit. Patient states she eats one servings of vegetables a day. Per patient eats 3 or more ounces of whole grains daily. Patient doesn't eat two or more servings of fish weekly. Patient states she does drink more than 36 ounces or 450 calories of beverages with added sugars weekly. Patient stated she does watch her salt intake.   Physical Activity Assessment: Patient states that walks up and down stairs and around house for approximately 420 minutes per week and does no vigorous exercise.  Smoking Status: Patient had never smoked and is not around smoke.  Quality of Life Assessment: In assessing patient's quality of life she stated that out of the past 30 days that she has felt her Physical health was not good all 30 days. Patient also stated that in the past 30 days that her mental health is not good including stress, depression and problems with emotions for 10 days. Patient did state that out of the past 30 days she felt her physical or mental health had not kept her from doing her usual activities including self-care, work or recreation.   Plan: Lab work will be done  today including a lipid panel, blood glucose, and Hgb A1C. Will call lab results when they are finished. Will discuss programs when lab results back and get appointment [id number] for Hypertension.

## 2014-03-09 NOTE — Patient Instructions (Signed)
Discussed health assessment with patient.. Will decide on programs she wants when call lab results. Let patient know that will call her on Wednesday with lab results and make appointment at [facility] for hypertension. Informed not to let Health and wellness do any labs or procedures until goes to eligibility and get orange card or patient assistance. Patient verbalized understanding.

## 2014-03-12 ENCOUNTER — Telehealth: Payer: Self-pay

## 2014-03-12 NOTE — Telephone Encounter (Signed)
Called to inform about lab work from 01/26/14. Informed patient: cholesterol- 222, HDL- 38, LDL- 146, triglycerides - 163, Bld Glucose -83 and HBG-A1C - 6.5.  Patient will call back for Health Coaching with [facility]. Informed patient of doctor's appointment at [facility] on Monday, October 5th at 4:15 PM. Address and directions given.

## 2014-03-19 ENCOUNTER — Encounter: Payer: Self-pay | Admitting: Family Medicine

## 2014-03-19 ENCOUNTER — Ambulatory Visit: Payer: Self-pay | Attending: Family Medicine | Admitting: Family Medicine

## 2014-03-19 VITALS — BP 109/75 | HR 71 | Temp 97.8°F | Resp 18 | Ht 66.0 in | Wt 213.0 lb

## 2014-03-19 DIAGNOSIS — I10 Essential (primary) hypertension: Secondary | ICD-10-CM | POA: Insufficient documentation

## 2014-03-19 DIAGNOSIS — R739 Hyperglycemia, unspecified: Secondary | ICD-10-CM

## 2014-03-19 DIAGNOSIS — F329 Major depressive disorder, single episode, unspecified: Secondary | ICD-10-CM

## 2014-03-19 DIAGNOSIS — Z23 Encounter for immunization: Secondary | ICD-10-CM

## 2014-03-19 DIAGNOSIS — F32A Depression, unspecified: Secondary | ICD-10-CM | POA: Insufficient documentation

## 2014-03-19 DIAGNOSIS — E119 Type 2 diabetes mellitus without complications: Secondary | ICD-10-CM | POA: Insufficient documentation

## 2014-03-19 DIAGNOSIS — E785 Hyperlipidemia, unspecified: Secondary | ICD-10-CM | POA: Insufficient documentation

## 2014-03-19 DIAGNOSIS — E039 Hypothyroidism, unspecified: Secondary | ICD-10-CM | POA: Insufficient documentation

## 2014-03-19 LAB — TSH: TSH: 1.798 u[IU]/mL (ref 0.350–4.500)

## 2014-03-19 LAB — COMPLETE METABOLIC PANEL WITH GFR
ALT: 16 U/L (ref 0–35)
AST: 15 U/L (ref 0–37)
Albumin: 4.7 g/dL (ref 3.5–5.2)
Alkaline Phosphatase: 78 U/L (ref 39–117)
BILIRUBIN TOTAL: 0.3 mg/dL (ref 0.2–1.2)
BUN: 16 mg/dL (ref 6–23)
CALCIUM: 10.2 mg/dL (ref 8.4–10.5)
CHLORIDE: 105 meq/L (ref 96–112)
CO2: 28 meq/L (ref 19–32)
CREATININE: 1.06 mg/dL (ref 0.50–1.10)
GFR, EST AFRICAN AMERICAN: 69 mL/min
GFR, EST NON AFRICAN AMERICAN: 60 mL/min
Glucose, Bld: 88 mg/dL (ref 70–99)
Potassium: 4.9 mEq/L (ref 3.5–5.3)
Sodium: 139 mEq/L (ref 135–145)
Total Protein: 7.7 g/dL (ref 6.0–8.3)

## 2014-03-19 LAB — GLUCOSE, POCT (MANUAL RESULT ENTRY): POC GLUCOSE: 139 mg/dL — AB (ref 70–99)

## 2014-03-19 MED ORDER — METFORMIN HCL ER 500 MG PO TB24: 1000.0000 mg | ORAL_TABLET | Freq: Every day | ORAL | Status: DC

## 2014-03-19 MED ORDER — ATORVASTATIN CALCIUM 20 MG PO TABS: 20.0000 mg | ORAL_TABLET | Freq: Every day | ORAL | Status: DC

## 2014-03-19 MED ORDER — LEVOTHYROXINE SODIUM 50 MCG PO TABS: 50.0000 ug | ORAL_TABLET | Freq: Every day | ORAL | Status: DC

## 2014-03-19 MED ORDER — SERTRALINE HCL 50 MG PO TABS: 50.0000 mg | ORAL_TABLET | Freq: Every day | ORAL | Status: DC

## 2014-03-19 MED ORDER — BISOPROLOL FUMARATE 5 MG PO TABS: 5.0000 mg | ORAL_TABLET | Freq: Every day | ORAL | Status: DC

## 2014-03-19 NOTE — Assessment & Plan Note (Signed)
A: BP at goal Meds complaint P: Continue current regimen

## 2014-03-19 NOTE — Assessment & Plan Note (Signed)
Check TSH. Refilled synthroid.

## 2014-03-19 NOTE — Assessment & Plan Note (Addendum)
A: newly dx P: Metformin Exercise  Low carb diet  Weight loss, goal 5 # in 3 months F//u in 3 months  Foot exam done today, urine ACR

## 2014-03-19 NOTE — Progress Notes (Signed)
   Subjective:    Patient ID: Heather Bailey, female    DOB: May 17, 1961, 53 y.o.   MRN: [id number]  HPI 53 yo F presents to establish care and discuss the following:  1. DM2: dx in 02/2014. Husband with DM2. Personal history of gestational DM2. Does not exercise. Skin darkening, especially under eyes.   2. HTN: taking BP. No CP, SOB, LE edema.   3. Hypothyroidism: taking synthroid. No palpitations. No edema.   Soc Hx: non smoker  Review of Systems As per HPI  GAD 7: core of 10, 1-2,5,6, 7. 2-1,3,4.     Objective:   Physical Exam BP 109/75  Pulse 71  Temp(Src) 97.8 F (36.6 C) (Oral)  Resp 18  Ht 5\' 6"  (1.676 m)  Wt 213 lb (96.616 kg)  BMI 34.40 kg/m2  SpO2 98% General appearance: alert, cooperative and no distress Lungs: normal WOB  Extremities: extremities normal, atraumatic, no cyanosis. Trace edema   Lab Results  Component Value Date   HGBA1C 6.5* 03/09/2014         Assessment & Plan:

## 2014-03-19 NOTE — Patient Instructions (Addendum)
Heather Bailey,  Thank you for coming in today. It was a pleasure meeting you. I look forward to being your primary doctor.   1. For hypertension: continue bisoprolol.   2. DM2: metformin 1000 mg with breakfast. Regular exercise. Low carb diet.   3. Depression: refill zoloft.   F/u in 3 months.   Dr. Adrian Blackwater

## 2014-03-19 NOTE — Progress Notes (Signed)
Establish Care F/u wise  Woman plan

## 2014-03-19 NOTE — Assessment & Plan Note (Signed)
Start statin lipitor 20

## 2014-03-20 ENCOUNTER — Telehealth: Payer: Self-pay | Admitting: *Deleted

## 2014-03-20 LAB — MICROALBUMIN / CREATININE URINE RATIO
Creatinine, Urine: 219 mg/dL
MICROALB UR: 0.9 mg/dL (ref <2.0)
Microalb Creat Ratio: 4.1 mg/g (ref 0.0–30.0)

## 2014-03-20 NOTE — Telephone Encounter (Signed)
Left message with lab results, if any question return call

## 2014-03-20 NOTE — Telephone Encounter (Signed)
Message copied by Betti Cruz on Tue Mar 20, 2014  3:32 PM ------      Message from: Boykin Nearing      Created: Tue Mar 20, 2014 12:06 PM       Normal TSH, continue current synthroid dose.      Normal CMP and urine albumin creatinine ratio ------

## 2014-04-12 ENCOUNTER — Ambulatory Visit: Payer: Self-pay

## 2014-04-12 DIAGNOSIS — Z789 Other specified health status: Secondary | ICD-10-CM

## 2014-04-12 NOTE — Progress Notes (Signed)
Patient returns today for Health Coaching regarding Nutrition for her diabetic range A1C, Lipid Panel  and activity.Marland Kitchen   NUTRITION: Patient had appointment [facility] and [facility] on October 5. [facility] started patient on Lipitor 20 mg every day and Metformin 500 mg every day. Patient stated that they did not go over anything,did not give Glucose meter and did give some information. Patient has not started cutting back on carbohydrates and sugary food products. Discussed watching carbohydrates, how to count them, serving sizes and number of carbs per day allowance. Patient received and reviewed the following handouts: A1C, Counting Carbohydrates, and Carb counting Menu, 1600 calorie meal plan and my plate example for diabetics. Gave patient measuring cup to measure serving sizes and demonstrated the serving sizes. We discussed all handouts.Patient would like to use [employer] Plan.Patient and I went over assessments and how to set goals in [employer]. Patient wants to weigh in monthly. Today's weight 208.7 pounds.Patient and I went over cookbook that patient received.  .ACTIVITY: Discussed walking and different exercises. Patient received and reviewed Act[employer] and  Exercise by [employer].  Miscellaneous: Discussed eligibility appointment that was changed to next week . Marland Kitchen  PLAN:  Pursue eligibility at [place] To monitor the amount of walking she does per day.Decrease carbohydrates and sugars in diet.

## 2014-04-13 ENCOUNTER — Ambulatory Visit: Payer: Self-pay

## 2014-04-13 NOTE — Patient Instructions (Signed)
Patient will follow 1600 calorie diet plan. Will review all handouts and exercise/activity book. Will increase fiber in diet. Will decrease Carbohydrates to less than 200 per day. Will increase exercise. Will measure portion sizes. Will call if has any questions. Will call patient in three weeks. Patient verbalized understanding.

## 2014-04-16 ENCOUNTER — Encounter: Payer: Self-pay | Admitting: Family Medicine

## 2014-05-02 ENCOUNTER — Ambulatory Visit: Payer: Self-pay

## 2014-05-07 ENCOUNTER — Telehealth (HOSPITAL_COMMUNITY): Payer: Self-pay | Admitting: *Deleted

## 2014-05-07 NOTE — Telephone Encounter (Signed)
Attempted to call patient to discuss the bills she left for me for her [place] visit. No one answered phone. Left voicemail for patient to call me back.

## 2014-05-08 ENCOUNTER — Telehealth: Payer: Self-pay | Admitting: *Deleted

## 2014-05-08 NOTE — Telephone Encounter (Signed)
Patient called me back. Explained to patient that [place] will cover the physician bill sent except it doesn't cover the flu vaccine. Encouraged patient to apply for the orange card. Patient verbalized understanding.

## 2014-05-22 ENCOUNTER — Telehealth: Payer: Self-pay

## 2014-05-22 NOTE — Telephone Encounter (Signed)
Called to see how doing with [employer] Program. Patient stated that had had some problems with Thanksgiving. We discussed some desserts that could make that were low to no sugar and lower carbohydrates for holidays. Discussed program for about 20 minutes. Will call if has any problems. Discussed medications and programs that were available at no cost. Discussed looking and doing chair exercises in [employer]. Patient stated was using treadmill at home and when son home for Christmas plan on going to track to walk.

## 2014-06-01 ENCOUNTER — Ambulatory Visit: Payer: Self-pay

## 2014-06-01 ENCOUNTER — Ambulatory Visit: Payer: Self-pay | Attending: Family Medicine

## 2014-06-11 ENCOUNTER — Other Ambulatory Visit: Payer: Self-pay | Admitting: *Deleted

## 2014-06-11 DIAGNOSIS — E039 Hypothyroidism, unspecified: Secondary | ICD-10-CM

## 2014-06-11 MED ORDER — LEVOTHYROXINE SODIUM 50 MCG PO TABS: 50.0000 ug | ORAL_TABLET | Freq: Every day | ORAL | Status: DC

## 2014-07-13 ENCOUNTER — Telehealth: Payer: Self-pay

## 2014-07-13 NOTE — Telephone Encounter (Signed)
Called patient to follow up for second [place]. Patient stated that had just started getting back on track by exercising and eating better. Patient stated that needed to come in and get weighed but would wait. Discussed that patient was still getting a bill for $198 from [facility] for Tx room cost. Asked patient to bring bill to [employer].  PLAN: Patient will return for weight check.

## 2014-07-17 ENCOUNTER — Telehealth: Payer: Self-pay

## 2014-07-17 NOTE — Telephone Encounter (Signed)
Patient stated that doing good with [employer]. Patient stated that was turned down for patient assistance. Is concerned about medication and her new diagnosed diabetes.  Her eyes are bothering her. Discussed that I would check in to medication assistance and would call back [facility] to see if they can give any guidance.

## 2014-08-28 ENCOUNTER — Ambulatory Visit: Payer: Self-pay | Attending: Family Medicine | Admitting: Family Medicine

## 2014-08-28 ENCOUNTER — Encounter: Payer: Self-pay | Admitting: Family Medicine

## 2014-08-28 VITALS — BP 99/67 | HR 89 | Temp 98.5°F | Resp 18 | Ht 66.0 in | Wt 208.0 lb

## 2014-08-28 DIAGNOSIS — Z114 Encounter for screening for human immunodeficiency virus [HIV]: Secondary | ICD-10-CM

## 2014-08-28 DIAGNOSIS — Z0000 Encounter for general adult medical examination without abnormal findings: Secondary | ICD-10-CM

## 2014-08-28 DIAGNOSIS — H04123 Dry eye syndrome of bilateral lacrimal glands: Secondary | ICD-10-CM | POA: Insufficient documentation

## 2014-08-28 DIAGNOSIS — E119 Type 2 diabetes mellitus without complications: Secondary | ICD-10-CM | POA: Insufficient documentation

## 2014-08-28 LAB — POCT GLYCOSYLATED HEMOGLOBIN (HGB A1C): HEMOGLOBIN A1C: 6.2

## 2014-08-28 LAB — GLUCOSE, POCT (MANUAL RESULT ENTRY): POC GLUCOSE: 179 mg/dL — AB (ref 70–99)

## 2014-08-28 MED ORDER — CYCLOSPORINE 0.05 % OP EMUL: 1.0000 [drp] | Freq: Two times a day (BID) | OPHTHALMIC | Status: DC

## 2014-08-28 MED ORDER — GLUCOSE BLOOD VI STRP: 1.0000 | ORAL_STRIP | Freq: Three times a day (TID) | Status: DC

## 2014-08-28 MED ORDER — TRUERESULT BLOOD GLUCOSE W/DEVICE KIT: 1.0000 | PACK | Freq: Three times a day (TID) | Status: DC

## 2014-08-28 MED ORDER — TRUEPLUS LANCETS 28G MISC: 1.0000 | Freq: Three times a day (TID) | Status: DC

## 2014-08-28 NOTE — Progress Notes (Signed)
   Subjective:    Patient ID: Heather Bailey, female    DOB: 1960/08/25, 54 y.o.   MRN: [id number] CC: DM2 f/u  HPI CHRONIC DIABETES  Disease Monitoring  Blood Sugar Ranges: not checking   Polyuria: no   Visual problems: yes, chronic dry eye and blurred vision at times   Medication Compliance: yes  Medication Side Effects  Hypoglycemia: no   Preventitive Health Care  Eye Exam: due, uninsured   Foot Exam: done   Diet pattern: regular   Exercise: moderate   Review of Systems     Objective:   Physical Exam BP 99/67 mmHg  Pulse 89  Temp(Src) 98.5 F (36.9 C)  Resp 18  Ht 5\' 6"  (1.676 m)  Wt 208 lb (94.348 kg)  BMI 33.59 kg/m2  SpO2 98% General appearance: alert, cooperative, no distress and morbidly obese Lungs: clear to auscultation bilaterally Heart: regular rate and rhythm, S1, S2 normal, no murmur, click, rub or gallop Extremities: extremities normal, atraumatic, no cyanosis or edema Lab Results  Component Value Date   HGBA1C 6.5* 03/09/2014  CBG 179     Assessment & Plan:

## 2014-08-28 NOTE — Patient Instructions (Addendum)
Heather Bailey,  Thank you for coming in today.  1. DM2:  Goal A1c < 7 Your A1c is the same as last time. Diabetes  Check blood sugar day fasting and 2 hrs after meals 2-3 times per day.  Goal fasting 100  Goal after eating < 160 Beware of hypoglycemia (low blood sugar) which is blood sugar < 70 with or without symptoms  Due for diabetic eye exam Low out cost optometrist (about $65.00 for office visit)  1. Dr. Thurston Hole Phone # [phone] [address]  [place], [place] [address]   2. [facility]  Phone # [phone] [address], [place] [address]    2. Chronic dry eyes: restasis paperwork will be faxed today.   F/u in 6 months for DM2 evaluation  Dr. Adrian Blackwater

## 2014-08-28 NOTE — Progress Notes (Signed)
F/U DM  Been out of medication x3 day

## 2014-08-28 NOTE — Assessment & Plan Note (Signed)
Screening HIV today  

## 2014-08-28 NOTE — Assessment & Plan Note (Addendum)
Due for screening colonoscopy. GI referral placed.

## 2014-08-28 NOTE — Assessment & Plan Note (Signed)
Chronic dry eyes: restasis paperwork will be faxed today.

## 2014-08-28 NOTE — Assessment & Plan Note (Signed)
1. DM2:  Goal A1c < 7 Your A1c is the same as last time. Diabetes  Check blood sugar day fasting and 2 hrs after meals 2-3 times per day.  Goal fasting 100  Goal after eating < 160 Beware of hypoglycemia (low blood sugar) which is blood sugar < 70 with or without symptoms  Due for diabetic eye exam Low out cost optometrist (about $65.00 for office visit)  1. Dr. Thurston Hole Phone # [phone] [address]  [place], [place] [address]   2. [facility]  Phone # [phone] [address] [place], [place] [address]

## 2014-08-29 LAB — HIV ANTIBODY (ROUTINE TESTING W REFLEX): HIV 1&2 Ab, 4th Generation: NONREACTIVE

## 2014-08-30 ENCOUNTER — Telehealth: Payer: Self-pay | Admitting: *Deleted

## 2014-08-30 NOTE — Telephone Encounter (Signed)
-----   Message from Boykin Nearing, MD sent at 08/29/2014  9:53 AM EDT ----- Screening HIV negative

## 2014-08-30 NOTE — Telephone Encounter (Signed)
Pt aware of lab results 

## 2014-09-10 ENCOUNTER — Telehealth: Payer: Self-pay | Admitting: Family Medicine

## 2014-09-10 DIAGNOSIS — H04123 Dry eye syndrome of bilateral lacrimal glands: Secondary | ICD-10-CM

## 2014-09-10 MED ORDER — CYCLOSPORINE 0.05 % OP EMUL: 1.0000 [drp] | Freq: Two times a day (BID) | OPHTHALMIC | Status: DC

## 2014-09-10 NOTE — Telephone Encounter (Signed)
Med assist rejected application for restasis. Sent in Rx for restasis. Patient will need to make an appt for PASS.

## 2014-09-12 ENCOUNTER — Encounter: Payer: Self-pay | Admitting: Family Medicine

## 2014-09-14 NOTE — Telephone Encounter (Signed)
Mrs. Bandel,  Unfortunately, I do not have the form. The form was faxed the day of your visit. After faxing, forms are usually securely shredded to protect patient information, and  not routinely saved and scanned unless specifically requested. I did not request it to be scanned. Scanning forms is most often done for medical records. I am sorry that it was not saved as you are in need of it now. I did receive a fax backed denial, so the form was faxed to the correct location. I did inform the pharmacy of the denial when I received it, and the the medication assistance (PASS) coordinator,  will work through the [employer] program to get restasis or a similar product for you.   Thank you,   Dr. Adrian Blackwater

## 2014-09-25 ENCOUNTER — Other Ambulatory Visit: Payer: Self-pay | Admitting: Family Medicine

## 2014-09-26 ENCOUNTER — Other Ambulatory Visit: Payer: Self-pay | Admitting: *Deleted

## 2014-09-26 DIAGNOSIS — E119 Type 2 diabetes mellitus without complications: Secondary | ICD-10-CM

## 2014-09-26 MED ORDER — ATORVASTATIN CALCIUM 20 MG PO TABS: 20.0000 mg | ORAL_TABLET | Freq: Every day | ORAL | Status: DC

## 2014-10-01 ENCOUNTER — Encounter: Payer: Self-pay | Admitting: Family Medicine

## 2014-10-01 ENCOUNTER — Other Ambulatory Visit: Payer: Self-pay | Admitting: Family Medicine

## 2014-10-02 ENCOUNTER — Other Ambulatory Visit: Payer: Self-pay | Admitting: Family Medicine

## 2014-10-02 DIAGNOSIS — E119 Type 2 diabetes mellitus without complications: Secondary | ICD-10-CM

## 2014-10-02 MED ORDER — METFORMIN HCL ER 500 MG PO TB24: 1000.0000 mg | ORAL_TABLET | Freq: Every day | ORAL | Status: DC

## 2014-10-15 ENCOUNTER — Encounter: Payer: Self-pay | Admitting: Internal Medicine

## 2014-10-28 IMAGING — CR DG CHEST 1V PORT
1 series · 1 of 1 positions shown · non-contrast
Comparison: 05/09/2005

CLINICAL DATA: Patient having chest tightness since yesterday,
worsening today, radiating to both arms and to the neck with
associated nausea, diaphoresis and lightheadedness.

EXAM:
PORTABLE CHEST - 1 VIEW

[AP]
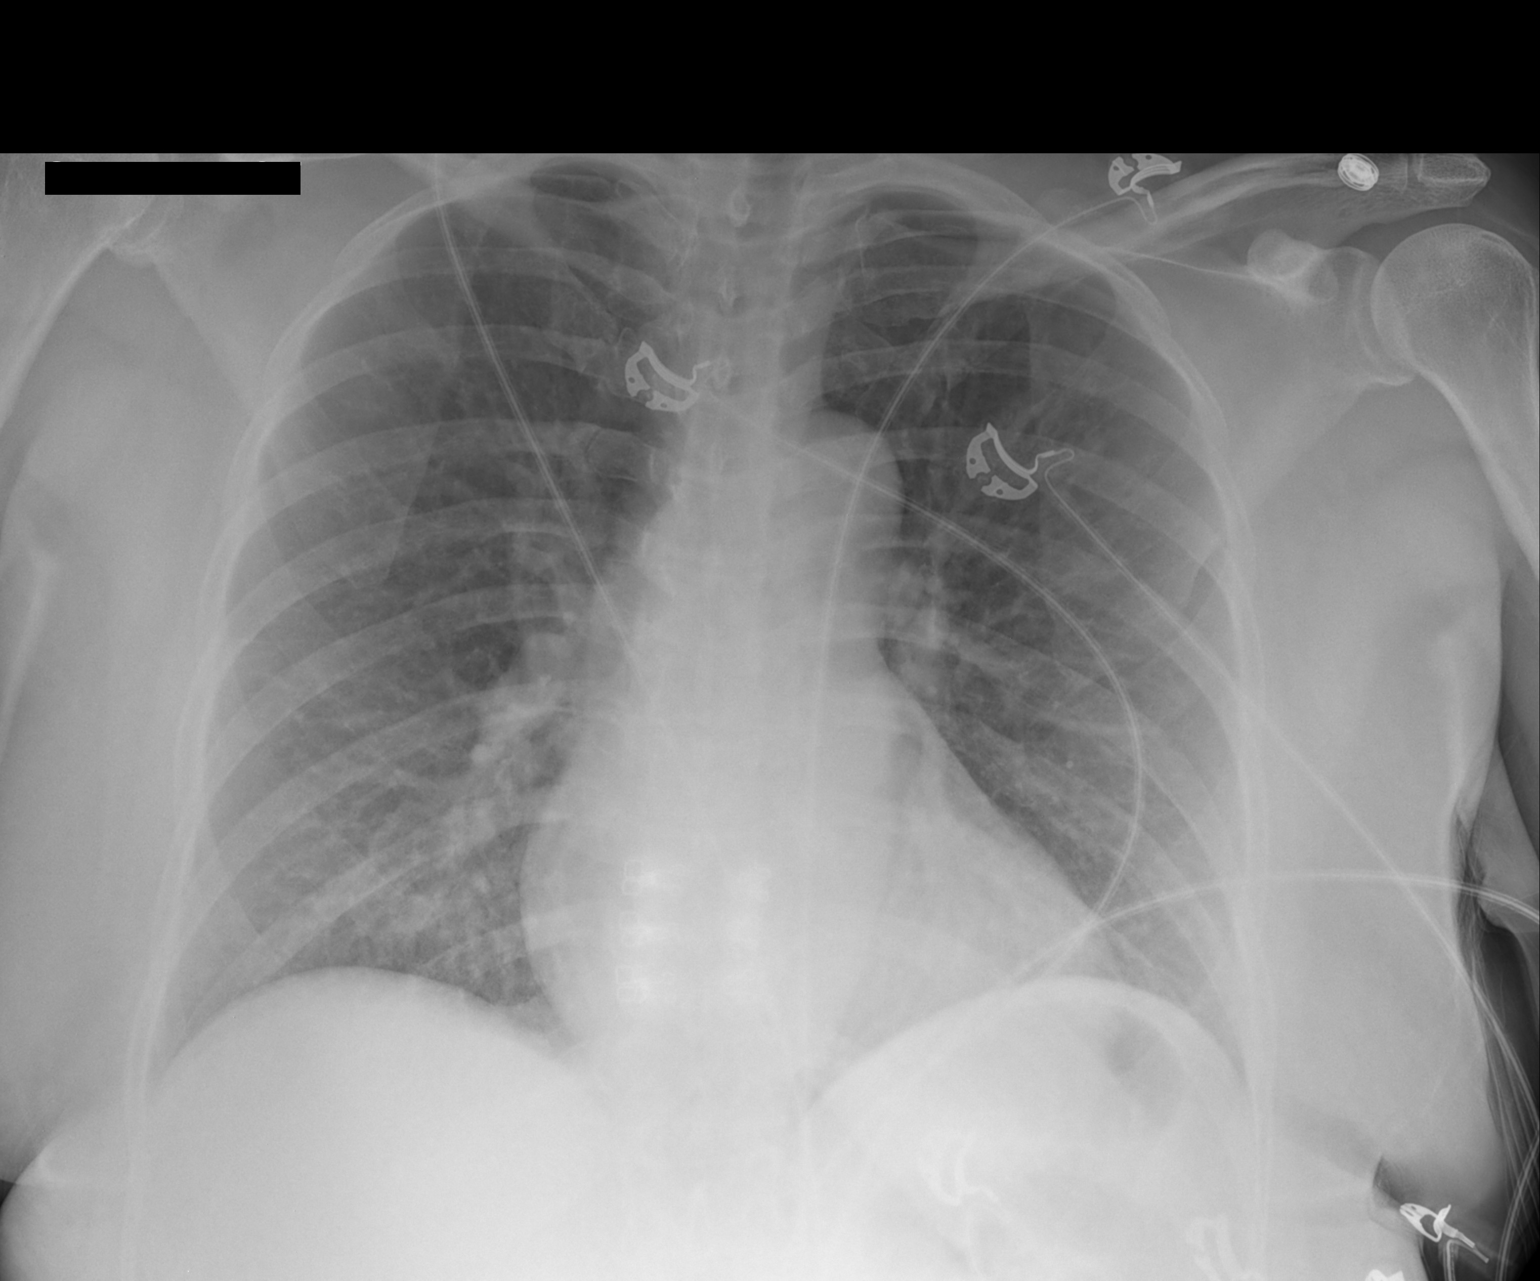

[1 of 1 positions shown; findings below may reference images not displayed]

FINDINGS: The heart size and mediastinal contours are within normal limits.
Both lungs are clear. The visualized skeletal structures are
unremarkable.
IMPRESSION: No active disease.

## 2015-03-06 ENCOUNTER — Encounter: Payer: Self-pay | Admitting: Family Medicine

## 2015-03-11 ENCOUNTER — Other Ambulatory Visit: Payer: Self-pay | Admitting: Family Medicine

## 2015-03-11 DIAGNOSIS — I10 Essential (primary) hypertension: Secondary | ICD-10-CM

## 2015-03-13 NOTE — Telephone Encounter (Signed)
Patient called to request a med refill for her blood pressure medication. Please f/u with pt. °

## 2015-03-14 NOTE — Telephone Encounter (Signed)
Nurse called patient, patient verified date of birth. Patient needs Zebeta filled. Patient has sent an email for prescription and explains pharmacy reports sending 6 or more requests for medication. Nurse sent prescription to pharmacy and apologized to patient for any inconveniences. Patient voices understanding and has no further questions at this time.

## 2015-03-14 NOTE — Telephone Encounter (Signed)
Patient called to request a med refill for her blood pressure medication, please f/u with pt.

## 2015-04-09 ENCOUNTER — Other Ambulatory Visit: Payer: Self-pay | Admitting: Family Medicine

## 2015-04-10 ENCOUNTER — Other Ambulatory Visit: Payer: Self-pay

## 2015-04-10 DIAGNOSIS — F32A Depression, unspecified: Secondary | ICD-10-CM

## 2015-04-10 DIAGNOSIS — E785 Hyperlipidemia, unspecified: Secondary | ICD-10-CM

## 2015-04-10 DIAGNOSIS — F329 Major depressive disorder, single episode, unspecified: Secondary | ICD-10-CM

## 2015-04-10 MED ORDER — ATORVASTATIN CALCIUM 20 MG PO TABS: 20.0000 mg | ORAL_TABLET | Freq: Every day | ORAL | Status: DC

## 2015-04-10 NOTE — Telephone Encounter (Signed)
Nurse called patient at home and mobile numbers, reached voicemail. Left message, on both voice mails,  for patient to call Nira Conn with [facility], at [phone]. Nurse called patient to make patient aware of 1 month supply of atorvastatin sent to pharmacy. Patient needs appointment before additional refills.  Refill request for sertraline sent to provider for approval.

## 2015-04-10 NOTE — Telephone Encounter (Signed)
Patient returned call to nurse.  Patient aware of atorvastatin, 1 month supply, to pharmacy. Patient aware of need for appointment before getting additional refills.  Nurse transferred patient to front office to schedule appointment tomorrow at 5pm. Patient aware of sertraline request sent to provider for approval.

## 2015-04-11 ENCOUNTER — Ambulatory Visit: Payer: Self-pay | Admitting: Family Medicine

## 2015-04-12 ENCOUNTER — Other Ambulatory Visit: Payer: Self-pay | Admitting: *Deleted

## 2015-04-12 ENCOUNTER — Ambulatory Visit: Payer: Self-pay | Attending: Family Medicine | Admitting: Family Medicine

## 2015-04-12 ENCOUNTER — Encounter: Payer: Self-pay | Admitting: Family Medicine

## 2015-04-12 VITALS — BP 111/75 | HR 67 | Temp 98.0°F | Resp 18 | Ht 67.0 in | Wt 204.0 lb

## 2015-04-12 DIAGNOSIS — M545 Low back pain, unspecified: Secondary | ICD-10-CM | POA: Insufficient documentation

## 2015-04-12 DIAGNOSIS — E039 Hypothyroidism, unspecified: Secondary | ICD-10-CM | POA: Insufficient documentation

## 2015-04-12 DIAGNOSIS — G8929 Other chronic pain: Secondary | ICD-10-CM | POA: Insufficient documentation

## 2015-04-12 DIAGNOSIS — E119 Type 2 diabetes mellitus without complications: Secondary | ICD-10-CM

## 2015-04-12 DIAGNOSIS — L853 Xerosis cutis: Secondary | ICD-10-CM | POA: Insufficient documentation

## 2015-04-12 DIAGNOSIS — F329 Major depressive disorder, single episode, unspecified: Secondary | ICD-10-CM | POA: Insufficient documentation

## 2015-04-12 DIAGNOSIS — F32A Depression, unspecified: Secondary | ICD-10-CM

## 2015-04-12 LAB — POCT GLYCOSYLATED HEMOGLOBIN (HGB A1C): HEMOGLOBIN A1C: 6

## 2015-04-12 LAB — TSH: TSH: 4.38 u[IU]/mL (ref 0.350–4.500)

## 2015-04-12 LAB — GLUCOSE, POCT (MANUAL RESULT ENTRY): POC GLUCOSE: 101 mg/dL — AB (ref 70–99)

## 2015-04-12 MED ORDER — SERTRALINE HCL 50 MG PO TABS: 50.0000 mg | ORAL_TABLET | Freq: Every day | ORAL | Status: DC

## 2015-04-12 MED ORDER — GABAPENTIN 300 MG PO CAPS: 300.0000 mg | ORAL_CAPSULE | Freq: Every day | ORAL | Status: DC

## 2015-04-12 NOTE — Progress Notes (Signed)
Patient here for 6 month F/U  Patient complains of back pain, present for a while due to an overdue surgery for buldging disk.

## 2015-04-12 NOTE — Patient Instructions (Addendum)
Mrs. Heather, Bailey job with weight loss and diabetes control.   Heather Bailey was seen today for diabetes and back pain.  Diagnoses and all orders for this visit:  Type 2 diabetes mellitus without complication, without long-term current use of insulin (HCC) -     Glucose (CBG) -     POCT A1C -     Microalbumin/Creatinine Ratio, Urine  Hypothyroidism, unspecified hypothyroidism type -     TSH  Chronic low back pain -     gabapentin (NEURONTIN) 300 MG capsule; Take 1 capsule (300 mg total) by mouth at bedtime.  Depression -     sertraline (ZOLOFT) 50 MG tablet; Take 1 tablet (50 mg total) by mouth daily.   Adding gabapentin for low back after one week increase to 1 pill twice a day if you notice improvement in pain and it is not making you too sleepy.   For dry skin try the aqua glycolic line of skin products I recommend the toner and face cream   Please apply for [place] discount and orange card, you can also inquire if any of your medications are on the PASS (medications assistance) list.   F/u in 2 months for chronic low back pain   Dr. Adrian Blackwater

## 2015-04-12 NOTE — Assessment & Plan Note (Signed)
For dry skin try the aqua glycolic line of skin products I recommend the toner and face cream

## 2015-04-12 NOTE — Progress Notes (Signed)
Patient ID: Heather Bailey, female   DOB: [birth year], 54 y.o.   MRN: [id number]   Subjective:  Patient ID: Heather Bailey, female    DOB: Sep 18, 1960  Age: 54 y.o. MRN: [id number]  CC: Diabetes and Back Pain   HPI Heather Bailey presents for   1. CHRONIC DIABETES  Disease Monitoring  Blood Sugar Ranges: normal   Polyuria: no   Visual problems: no   Medication Compliance: yes  Medication Side Effects  Hypoglycemia: no   Preventitive Health Care  Eye Exam: due   Foot Exam: done today   Diet pattern:   Exercise: minimal to moderate   2. Back pain: x 10 years. Always severe. Aleve makes it better. Acupuncture helped in the past. Bilateral low back. Gets down to hips. No weakness in legs. No fecal or urinary incontinence.   3. Depression: she is out of zoloft. Requesting refills. Denies SI. Is anxious at times about not being able to find work.   4. Dry skin: dry skin on face. Also with dark spots under eyes. She questions if dryness is from diabetes. She uses oil of olay face cream. She does not sleep well due to back and hip pain.   Social History  Substance Use Topics  . Smoking status: Never Smoker   . Smokeless tobacco: Never Used  . Alcohol Use: No   Outpatient Prescriptions Prior to Visit  Medication Sig Dispense Refill  . atorvastatin (LIPITOR) 20 MG tablet Take 1 tablet (20 mg total) by mouth daily. 30 tablet 0  . bisoprolol (ZEBETA) 5 MG tablet TAKE 1 TABLET BY MOUTH DAILY. 30 tablet 5  . Blood Glucose Monitoring Suppl (TRUERESULT BLOOD GLUCOSE) W/DEVICE KIT 1 each by Does not apply route 3 (three) times daily before meals. 1 each 0  . cycloSPORINE (RESTASIS) 0.05 % ophthalmic emulsion Place 1 drop into both eyes 2 (two) times daily. 0.4 mL 11  . glucose blood (TRUETEST TEST) test strip 1 each by Other route 3 (three) times daily. Use as instructed 100 each 12  . levothyroxine (SYNTHROID, LEVOTHROID) 50 MCG tablet Take 1 tablet (50 mcg total) by mouth daily. 90 tablet 3    . metFORMIN (GLUCOPHAGE XR) 500 MG 24 hr tablet Take 2 tablets (1,000 mg total) by mouth daily with breakfast. 60 tablet 11  . sertraline (ZOLOFT) 50 MG tablet Take 1 tablet (50 mg total) by mouth daily. 30 tablet 5  . TRUEPLUS LANCETS 28G MISC 1 each by Does not apply route 3 (three) times daily. 100 each 12   No facility-administered medications prior to visit.    ROS Review of Systems  Constitutional: Negative for fever and chills.  Eyes: Negative for visual disturbance.  Respiratory: Negative for shortness of breath.   Cardiovascular: Negative for chest pain.  Gastrointestinal: Negative for abdominal pain and blood in stool.  Musculoskeletal: Positive for back pain. Negative for arthralgias.  Skin: Negative for rash.  Allergic/Immunologic: Negative for immunocompromised state.  Hematological: Negative for adenopathy. Does not bruise/bleed easily.  Psychiatric/Behavioral: Negative for suicidal ideas and dysphoric mood. The patient is nervous/anxious.   GAD-7: 6. 1-2,3,4,6. 2-1.   Objective:  BP 111/75 mmHg  Pulse 67  Temp(Src) 98 F (36.7 C) (Oral)  Resp 18  Ht $R'5\' 7"'VI$  (1.702 m)  Wt 204 lb (92.534 kg)  BMI 31.94 kg/m2  SpO2 97%  BP/Weight 04/12/2015 08/28/2014 01/0/2725  Systolic BP 366 99 440  Diastolic BP 75 67 75  Wt. (Lbs) 204 208 213  BMI 31.94 33.59 34.4    Physical Exam  Constitutional: She is oriented to person, place, and time. She appears well-developed and well-nourished. No distress.  HENT:  Head: Normocephalic and atraumatic.  Cardiovascular: Normal rate, regular rhythm, normal heart sounds and intact distal pulses.   Pulmonary/Chest: Effort normal and breath sounds normal.  Musculoskeletal: She exhibits no edema.  Neurological: She is alert and oriented to person, place, and time.  Skin: Skin is warm and dry. No rash noted.  Psychiatric: She has a normal mood and affect.    Lab Results  Component Value Date   HGBA1C 6.0 04/12/2015   CBG 101   Assessment & Plan:   Problem List Items Addressed This Visit    Chronic low back pain   Relevant Medications   gabapentin (NEURONTIN) 300 MG capsule   Depression   Relevant Medications   sertraline (ZOLOFT) 50 MG tablet   DM2 (diabetes mellitus, type 2) (HCC) - Primary (Chronic)   Relevant Orders   Glucose (CBG) (Completed)   POCT A1C (Completed)   Microalbumin/Creatinine Ratio, Urine   Hypothyroidism (Chronic)   Relevant Orders   TSH      No orders of the defined types were placed in this encounter.    Follow-up: No Follow-up on file.   Boykin Nearing MD

## 2015-04-15 ENCOUNTER — Other Ambulatory Visit: Payer: Self-pay

## 2015-05-14 ENCOUNTER — Telehealth: Payer: Self-pay | Admitting: Family Medicine

## 2015-05-14 NOTE — Telephone Encounter (Signed)
Pt needs a refill on atorvastatin (LIPITOR) 20 MG tablet, pt states that she is currently out. Thank you, Fonda Kinder, ASA

## 2015-05-15 ENCOUNTER — Other Ambulatory Visit: Payer: Self-pay | Admitting: Family Medicine

## 2015-05-16 ENCOUNTER — Other Ambulatory Visit: Payer: Self-pay | Admitting: Family Medicine

## 2015-05-16 ENCOUNTER — Other Ambulatory Visit: Payer: Self-pay | Admitting: *Deleted

## 2015-05-16 DIAGNOSIS — E785 Hyperlipidemia, unspecified: Secondary | ICD-10-CM

## 2015-05-16 MED ORDER — ATORVASTATIN CALCIUM 20 MG PO TABS: 20.0000 mg | ORAL_TABLET | Freq: Every day | ORAL | Status: DC

## 2015-05-16 NOTE — Telephone Encounter (Signed)
Rx refill send to [facility]  Pt aware

## 2015-05-17 ENCOUNTER — Other Ambulatory Visit: Payer: Self-pay | Admitting: *Deleted

## 2015-05-17 NOTE — Telephone Encounter (Signed)
error 

## 2015-05-17 NOTE — Telephone Encounter (Signed)
Atorvastatin refill was completed.

## 2015-05-28 NOTE — Progress Notes (Signed)
Called patient because has not had mammogram in over a year,No [facility], to she if wanted to be re screened for [facility]. Left patient a message on both phone lines to return my call.  PLAN: If does not return call or go to [facility] in next two months, will place on inactive list or send certified letter.

## 2015-05-29 ENCOUNTER — Other Ambulatory Visit: Payer: Self-pay | Admitting: Family Medicine

## 2015-05-29 DIAGNOSIS — Z1231 Encounter for screening mammogram for malignant neoplasm of breast: Secondary | ICD-10-CM

## 2015-05-30 ENCOUNTER — Ambulatory Visit
Admission: RE | Admit: 2015-05-30 | Discharge: 2015-05-30 | Disposition: A | Discharge status: Home / Self Care | Payer: No Typology Code available for payment source | Source: Ambulatory Visit | Attending: Family Medicine | Admitting: Family Medicine

## 2015-05-30 DIAGNOSIS — Z1231 Encounter for screening mammogram for malignant neoplasm of breast: Secondary | ICD-10-CM

## 2015-06-11 ENCOUNTER — Other Ambulatory Visit: Payer: Self-pay | Admitting: Family Medicine

## 2015-06-12 ENCOUNTER — Other Ambulatory Visit: Payer: Self-pay | Admitting: *Deleted

## 2015-06-13 ENCOUNTER — Telehealth: Payer: Self-pay | Admitting: Family Medicine

## 2015-06-13 DIAGNOSIS — I10 Essential (primary) hypertension: Secondary | ICD-10-CM

## 2015-06-13 MED ORDER — METOPROLOL SUCCINATE ER 25 MG PO TB24: 25.0000 mg | ORAL_TABLET | Freq: Every day | ORAL | Status: DC

## 2015-06-13 NOTE — Telephone Encounter (Signed)
Please inform patient bisoprolol 5 mg changed to toprol 25 mg as bisoprolol is on national backorder and not available.

## 2015-07-11 MED FILL — ?LEVOTHYROXINE 50 MCG TABLE: 50 | 30 days supply | Qty: 30 | Fill #1

## 2015-07-11 MED FILL — METFORMIN HCL ER 500 MG TAB: 500 | 30 days supply | Qty: 60 | Fill #9

## 2015-07-11 MED FILL — METOPROLOL SUCC ER 25 MG TA: 25 | 30 days supply | Qty: 30 | Fill #1

## 2015-07-11 MED FILL — ?ATORVASTATIN 20 MG TABLET: 20 | 30 days supply | Qty: 30 | Fill #2

## 2015-07-11 MED FILL — ?SERTRALINE HCL 50 MG TABLE: 50 | 25 days supply | Qty: 25 | Fill #3

## 2015-08-15 MED FILL — METOPROLOL SUCC ER 25 MG TA: 25 | 30 days supply | Qty: 30 | Fill #2

## 2015-08-15 MED FILL — ?SERTRALINE HCL 50 MG TABLE: 50 | 25 days supply | Qty: 25 | Fill #4

## 2015-08-15 MED FILL — LEVOTHYROXINE 50 MCG TABLET: 50 | 30 days supply | Qty: 30 | Fill #2

## 2015-08-15 MED FILL — ?ATORVASTATIN 20 MG TABLET: 20 | 30 days supply | Qty: 30 | Fill #3

## 2015-08-15 MED FILL — METFORMIN HCL ER 500 MG TAB: 500 | 30 days supply | Qty: 60 | Fill #10

## 2015-08-16 MED FILL — $RESTASIS 0.05% EYE EMULSIO: 0.05% | 30 days supply | Qty: 60 | Fill #1

## 2015-09-13 ENCOUNTER — Other Ambulatory Visit: Payer: Self-pay | Admitting: Family Medicine

## 2015-09-13 MED FILL — METFORMIN HCL ER 500 MG TAB: 500 | 30 days supply | Qty: 60 | Fill #11

## 2015-09-13 MED FILL — METOPROLOL SUCC ER 25 MG TA: 25 | 30 days supply | Qty: 30 | Fill #3

## 2015-09-13 MED FILL — ?SERTRALINE HCL 50 MG TABLE: 50 | 25 days supply | Qty: 25 | Fill #5

## 2015-09-13 MED FILL — ATORVASTATIN 20 MG TABLET: 20 | 30 days supply | Qty: 30 | Fill #0

## 2015-09-13 MED FILL — LEVOTHYROXINE 50 MCG TABLET: 50 | 30 days supply | Qty: 30 | Fill #3

## 2015-10-15 ENCOUNTER — Other Ambulatory Visit: Payer: Self-pay | Admitting: Family Medicine

## 2015-10-15 MED FILL — ATORVASTATIN 20 MG TABLET: 20 | 30 days supply | Qty: 30 | Fill #1

## 2015-10-15 MED FILL — SERTRALINE HCL 50 MG TABLET: 50 | 15 days supply | Qty: 15 | Fill #6

## 2015-10-15 MED FILL — LEVOTHYROXINE 50 MCG TABLET: 50 | 30 days supply | Qty: 30 | Fill #0

## 2015-10-15 MED FILL — METOPROLOL SUCC ER 25 MG TA: 25 | 30 days supply | Qty: 30 | Fill #4

## 2015-10-15 MED FILL — METFORMIN HCL ER 500 MG TAB: 500 | 30 days supply | Qty: 60 | Fill #0

## 2015-10-30 ENCOUNTER — Other Ambulatory Visit: Payer: Self-pay | Admitting: Family Medicine

## 2015-10-30 MED FILL — ?SERTRALINE HCL 50 MG TABLE: 50 | 30 days supply | Qty: 30 | Fill #0

## 2015-11-20 ENCOUNTER — Telehealth: Payer: Self-pay | Admitting: Family Medicine

## 2015-11-20 MED FILL — ?LEVOTHYROXINE 50 MCG TABLE: 50 | 30 days supply | Qty: 30 | Fill #1

## 2015-11-20 MED FILL — ?ATORVASTATIN 20 MG TABLET: 20 | 30 days supply | Qty: 30 | Fill #2

## 2015-11-20 MED FILL — METOPROLOL SUCC ER 25 MG TA: 25 | 30 days supply | Qty: 30 | Fill #5

## 2015-11-20 NOTE — Telephone Encounter (Signed)
Medication Refill: metFORMIN (GLUCOPHAGE-XR) 500 MG 24 hr tablet  Pt is all out of medication.  Scheduled her an appointment on the 22nd for a medication refill  Pt would like to know if she could get one refill before her appointment  Please assist. Thank you

## 2015-11-21 MED ORDER — METFORMIN HCL ER 500 MG PO TB24: 1000.0000 mg | ORAL_TABLET | Freq: Every day | ORAL | Status: DC

## 2015-11-21 MED FILL — METFORMIN HCL ER 500 MG TAB: 500 | 30 days supply | Qty: 60 | Fill #0

## 2015-11-21 NOTE — Telephone Encounter (Signed)
LVM Rx refilled Refilled send to [facility]

## 2015-12-05 ENCOUNTER — Ambulatory Visit: Payer: No Typology Code available for payment source | Admitting: Family Medicine

## 2015-12-10 MED FILL — SERTRALINE HCL 50 MG TABLET: 50 | 30 days supply | Qty: 30 | Fill #1

## 2015-12-19 ENCOUNTER — Encounter: Payer: Self-pay | Admitting: Family Medicine

## 2015-12-19 ENCOUNTER — Ambulatory Visit: Payer: Self-pay | Attending: Family Medicine | Admitting: Family Medicine

## 2015-12-19 VITALS — BP 114/80 | HR 81 | Temp 99.1°F | Resp 14 | Ht 67.0 in | Wt 206.0 lb

## 2015-12-19 DIAGNOSIS — E785 Hyperlipidemia, unspecified: Secondary | ICD-10-CM | POA: Insufficient documentation

## 2015-12-19 DIAGNOSIS — I10 Essential (primary) hypertension: Secondary | ICD-10-CM | POA: Insufficient documentation

## 2015-12-19 DIAGNOSIS — F32A Depression, unspecified: Secondary | ICD-10-CM

## 2015-12-19 DIAGNOSIS — Z79899 Other long term (current) drug therapy: Secondary | ICD-10-CM | POA: Insufficient documentation

## 2015-12-19 DIAGNOSIS — G8929 Other chronic pain: Secondary | ICD-10-CM | POA: Insufficient documentation

## 2015-12-19 DIAGNOSIS — Z1159 Encounter for screening for other viral diseases: Secondary | ICD-10-CM

## 2015-12-19 DIAGNOSIS — E039 Hypothyroidism, unspecified: Secondary | ICD-10-CM | POA: Insufficient documentation

## 2015-12-19 DIAGNOSIS — F329 Major depressive disorder, single episode, unspecified: Secondary | ICD-10-CM | POA: Insufficient documentation

## 2015-12-19 DIAGNOSIS — K0530 Chronic periodontitis, unspecified: Secondary | ICD-10-CM | POA: Insufficient documentation

## 2015-12-19 DIAGNOSIS — M545 Low back pain, unspecified: Secondary | ICD-10-CM

## 2015-12-19 DIAGNOSIS — H04123 Dry eye syndrome of bilateral lacrimal glands: Secondary | ICD-10-CM

## 2015-12-19 DIAGNOSIS — K0889 Other specified disorders of teeth and supporting structures: Secondary | ICD-10-CM | POA: Insufficient documentation

## 2015-12-19 DIAGNOSIS — Z7984 Long term (current) use of oral hypoglycemic drugs: Secondary | ICD-10-CM | POA: Insufficient documentation

## 2015-12-19 DIAGNOSIS — E119 Type 2 diabetes mellitus without complications: Secondary | ICD-10-CM | POA: Insufficient documentation

## 2015-12-19 LAB — GLUCOSE, POCT (MANUAL RESULT ENTRY): POC GLUCOSE: 95 mg/dL (ref 70–99)

## 2015-12-19 LAB — POCT GLYCOSYLATED HEMOGLOBIN (HGB A1C): HEMOGLOBIN A1C: 6.1

## 2015-12-19 MED ORDER — SERTRALINE HCL 50 MG PO TABS: 50.0000 mg | ORAL_TABLET | Freq: Every day | ORAL | Status: DC

## 2015-12-19 MED ORDER — GLUCOSE BLOOD VI STRP: 1.0000 | ORAL_STRIP | Freq: Three times a day (TID) | Status: DC

## 2015-12-19 MED ORDER — TRUEPLUS LANCETS 28G MISC: 1.0000 | Freq: Three times a day (TID) | Status: DC

## 2015-12-19 MED ORDER — METFORMIN HCL ER 500 MG PO TB24
1000.0000 mg | ORAL_TABLET | Freq: Every day | ORAL | Status: DC
Start: 2015-12-19 — End: 2016-08-21

## 2015-12-19 MED ORDER — AMOXICILLIN 500 MG PO CAPS: 500.0000 mg | ORAL_CAPSULE | Freq: Three times a day (TID) | ORAL | Status: DC

## 2015-12-19 MED ORDER — LEVOTHYROXINE SODIUM 50 MCG PO TABS: 50.0000 ug | ORAL_TABLET | Freq: Every day | ORAL | Status: DC

## 2015-12-19 MED ORDER — ATORVASTATIN CALCIUM 20 MG PO TABS: 20.0000 mg | ORAL_TABLET | Freq: Every day | ORAL | Status: DC

## 2015-12-19 MED ORDER — CYCLOSPORINE 0.05 % OP EMUL: 1.0000 [drp] | Freq: Two times a day (BID) | OPHTHALMIC | Status: DC

## 2015-12-19 MED ORDER — METOPROLOL SUCCINATE ER 25 MG PO TB24: 25.0000 mg | ORAL_TABLET | Freq: Every day | ORAL | Status: DC

## 2015-12-19 MED ORDER — GABAPENTIN 300 MG PO CAPS: 300.0000 mg | ORAL_CAPSULE | Freq: Every day | ORAL | Status: DC

## 2015-12-19 MED ORDER — TRUERESULT BLOOD GLUCOSE W/DEVICE KIT: 1.0000 | PACK | Freq: Three times a day (TID) | Status: DC

## 2015-12-19 MED FILL — METFORMIN HCL ER 500 MG TAB: 500 | 30 days supply | Qty: 60 | Fill #0

## 2015-12-19 MED FILL — !TRUE METRIX BLOOD GLUCOSE: 1 days supply | Qty: 1 | Fill #0

## 2015-12-19 MED FILL — TRUEplus LANCETS 28G MISC: 30 days supply | Qty: 100 | Fill #0

## 2015-12-19 MED FILL — TRUE METRIX TEST STRIP: 30 days supply | Qty: 100 | Fill #0

## 2015-12-19 MED FILL — METOPROLOL SUCC ER 25 MG TA: 25 | 30 days supply | Qty: 30 | Fill #0

## 2015-12-19 MED FILL — ?ATORVASTATIN 20 MG TABLET: 20 | 30 days supply | Qty: 30 | Fill #0

## 2015-12-19 MED FILL — ?LEVOTHYROXINE 50 MCG TABLE: 50 | 30 days supply | Qty: 30 | Fill #2

## 2015-12-19 MED FILL — AMOXICILLIN 500 MG CAPSULE: 500 | 10 days supply | Qty: 30 | Fill #0

## 2015-12-19 MED FILL — GABAPENTIN 300 MG CAPSULE: 300 | 30 days supply | Qty: 30 | Fill #0

## 2015-12-19 MED FILL — !RESTASIS 0.05% EYE EMULSIO: 0.05 | 30 days supply | Qty: 60 | Fill #0

## 2015-12-19 NOTE — Patient Instructions (Addendum)
Caasi was seen today for follow-up.  Diagnoses and all orders for this visit:  Hypothyroidism, unspecified hypothyroidism type -     levothyroxine (SYNTHROID, LEVOTHROID) 50 MCG tablet; Take 1 tablet (50 mcg total) by mouth daily.  Type 2 diabetes mellitus without complication, without long-term current use of insulin (HCC) -     HgB A1c -     Glucose (CBG) -     atorvastatin (LIPITOR) 20 MG tablet; Take 1 tablet (20 mg total) by mouth daily. -     Blood Glucose Monitoring Suppl (TRUERESULT BLOOD GLUCOSE) w/Device KIT; 1 each by Does not apply route 3 (three) times daily before meals. -     glucose blood (TRUETEST TEST) test strip; 1 each by Other route 3 (three) times daily. Use as instructed -     metFORMIN (GLUCOPHAGE-XR) 500 MG 24 hr tablet; Take 2 tablets (1,000 mg total) by mouth daily with breakfast. -     TRUEPLUS LANCETS 28G MISC; 1 each by Does not apply route 3 (three) times daily.  Need for hepatitis C screening test -     Hepatitis C antibody, reflex  Chronic dryness of both eyes -     cycloSPORINE (RESTASIS) 0.05 % ophthalmic emulsion; Place 1 drop into both eyes 2 (two) times daily.  Chronic low back pain -     gabapentin (NEURONTIN) 300 MG capsule; Take 1 capsule (300 mg total) by mouth at bedtime.  Essential hypertension -     metoprolol succinate (TOPROL-XL) 25 MG 24 hr tablet; Take 1 tablet (25 mg total) by mouth daily.  Depression -     sertraline (ZOLOFT) 50 MG tablet; Take 1 tablet (50 mg total) by mouth daily.  Hyperlipidemia -     atorvastatin (LIPITOR) 20 MG tablet; Take 1 tablet (20 mg total) by mouth daily.   Be sure to apply for the [place] discount and orange card- ask about the applications at the front desk.   F/u in 3 months for diabetes and hypothyroidism    Dr. Adrian Blackwater

## 2015-12-19 NOTE — Progress Notes (Signed)
Pt here for F/U. Pt CBG is 95 and A1C is 6.1. Pt needs a refill on all medications except levothyroxine. Pt has taken her medications this morning.

## 2015-12-19 NOTE — Progress Notes (Signed)
Subjective:  Patient ID: Heather Bailey, female    DOB: Dec 19, 1960  Age: 55 y.o. MRN: [id number]  CC: Hypertension; Diabetes; and Dental Pain   HPI Jisel Fleet presents for    1. HTN: compliant with regimen. Has HA. No CP, SOB or lege swelling.   2. DM2: taking metformin. No GI upset. No vision changes, polyuria, polydipsia, or tingling or numbness in extremities.   3. Tooth pain: painful and loose upper incisor. No swelling or fever. She does not have a dental home.    Social History  Substance Use Topics  . Smoking status: Never Smoker   . Smokeless tobacco: Never Used  . Alcohol Use: No   Outpatient Prescriptions Prior to Visit  Medication Sig Dispense Refill  . atorvastatin (LIPITOR) 20 MG tablet TAKE 1 TABLET BY MOUTH DAILY 30 tablet 2  . Blood Glucose Monitoring Suppl (TRUERESULT BLOOD GLUCOSE) W/DEVICE KIT 1 each by Does not apply route 3 (three) times daily before meals. 1 each 0  . cycloSPORINE (RESTASIS) 0.05 % ophthalmic emulsion Place 1 drop into both eyes 2 (two) times daily. 0.4 mL 11  . gabapentin (NEURONTIN) 300 MG capsule Take 1 capsule (300 mg total) by mouth at bedtime. 30 capsule 3  . glucose blood (TRUETEST TEST) test strip 1 each by Other route 3 (three) times daily. Use as instructed 100 each 12  . levothyroxine (SYNTHROID, LEVOTHROID) 50 MCG tablet TAKE 1 TABLET BY MOUTH DAILY. 30 tablet 2  . metFORMIN (GLUCOPHAGE-XR) 500 MG 24 hr tablet Take 2 tablets (1,000 mg total) by mouth daily with breakfast. Must have office visit for refills 60 tablet 0  . metoprolol succinate (TOPROL-XL) 25 MG 24 hr tablet Take 1 tablet (25 mg total) by mouth daily. 30 tablet 5  . sertraline (ZOLOFT) 50 MG tablet Take 1 tablet (50 mg total) by mouth daily. 30 tablet 2  . TRUEPLUS LANCETS 28G MISC 1 each by Does not apply route 3 (three) times daily. 100 each 12   No facility-administered medications prior to visit.    ROS Review of Systems  Constitutional: Negative for  fever and chills.  HENT: Positive for dental problem.   Eyes: Negative for visual disturbance.  Respiratory: Negative for shortness of breath.   Cardiovascular: Negative for chest pain.  Gastrointestinal: Negative for abdominal pain and blood in stool.  Musculoskeletal: Negative for back pain and arthralgias.  Skin: Negative for rash.  Allergic/Immunologic: Negative for immunocompromised state.  Neurological: Positive for headaches.  Hematological: Negative for adenopathy. Does not bruise/bleed easily.  Psychiatric/Behavioral: Negative for suicidal ideas and dysphoric mood.    Objective:  BP 114/80 mmHg  Pulse 81  Temp(Src) 99.1 F (37.3 C) (Oral)  Resp 14  Ht '5\' 7"'$  (1.702 m)  Wt 206 lb (93.441 kg)  BMI 32.26 kg/m2  SpO2 96%  BP/Weight 12/19/2015 04/12/2015 9/56/2130  Systolic BP 865 784 99  Diastolic BP 80 75 67  Wt. (Lbs) 206 204 208  BMI 32.26 31.94 33.59    Physical Exam  Constitutional: She is oriented to person, place, and time. She appears well-developed and well-nourished. No distress.  HENT:  Head: Normocephalic and atraumatic.  Mouth/Throat: Abnormal dentition.    Neck: No thyromegaly present.  Cardiovascular: Normal rate, regular rhythm, normal heart sounds and intact distal pulses.   Pulmonary/Chest: Effort normal and breath sounds normal.  Musculoskeletal: She exhibits no edema.  Neurological: She is alert and oriented to person, place, and time.  Skin: Skin is warm  and dry. No rash noted.  Psychiatric: She has a normal mood and affect.    Lab Results  Component Value Date   HGBA1C 6.0 04/12/2015   A1c 6.1  CBG 95  Assessment & Plan:   There are no diagnoses linked to this encounter.  Coralee was seen today for follow-up.  Diagnoses and all orders for this visit:  Hypothyroidism, unspecified hypothyroidism type -     levothyroxine (SYNTHROID, LEVOTHROID) 50 MCG tablet; Take 1 tablet (50 mcg total) by mouth daily.  Type 2 diabetes mellitus  without complication, without long-term current use of insulin (HCC) -     HgB A1c -     Glucose (CBG) -     atorvastatin (LIPITOR) 20 MG tablet; Take 1 tablet (20 mg total) by mouth daily. -     Blood Glucose Monitoring Suppl (TRUERESULT BLOOD GLUCOSE) w/Device KIT; 1 each by Does not apply route 3 (three) times daily before meals. -     glucose blood (TRUETEST TEST) test strip; 1 each by Other route 3 (three) times daily. Use as instructed -     metFORMIN (GLUCOPHAGE-XR) 500 MG 24 hr tablet; Take 2 tablets (1,000 mg total) by mouth daily with breakfast. -     TRUEPLUS LANCETS 28G MISC; 1 each by Does not apply route 3 (three) times daily. -     COMPLETE METABOLIC PANEL WITH GFR  Need for hepatitis C screening test -     Hepatitis C antibody, reflex  Chronic dryness of both eyes -     cycloSPORINE (RESTASIS) 0.05 % ophthalmic emulsion; Place 1 drop into both eyes 2 (two) times daily.  Chronic low back pain -     gabapentin (NEURONTIN) 300 MG capsule; Take 1 capsule (300 mg total) by mouth at bedtime.  Essential hypertension -     metoprolol succinate (TOPROL-XL) 25 MG 24 hr tablet; Take 1 tablet (25 mg total) by mouth daily. -     COMPLETE METABOLIC PANEL WITH GFR  Depression -     sertraline (ZOLOFT) 50 MG tablet; Take 1 tablet (50 mg total) by mouth daily.  Hyperlipidemia -     atorvastatin (LIPITOR) 20 MG tablet; Take 1 tablet (20 mg total) by mouth daily. -     Lipid Panel  Periodontitis -     amoxicillin (AMOXIL) 500 MG capsule; Take 1 capsule (500 mg total) by mouth 3 (three) times daily. -     Ambulatory referral to Dentistry  Other orders -     Hepatitis C antibody   Meds ordered this encounter  Medications  . atorvastatin (LIPITOR) 20 MG tablet    Sig: Take 1 tablet (20 mg total) by mouth daily.    Dispense:  90 tablet    Refill:  2  . Blood Glucose Monitoring Suppl (TRUERESULT BLOOD GLUCOSE) w/Device KIT    Sig: 1 each by Does not apply route 3 (three) times  daily before meals.    Dispense:  1 each    Refill:  0  . cycloSPORINE (RESTASIS) 0.05 % ophthalmic emulsion    Sig: Place 1 drop into both eyes 2 (two) times daily.    Dispense:  0.4 mL    Refill:  11  . gabapentin (NEURONTIN) 300 MG capsule    Sig: Take 1 capsule (300 mg total) by mouth at bedtime.    Dispense:  90 capsule    Refill:  2  . glucose blood (TRUETEST TEST) test strip    Sig:  1 each by Other route 3 (three) times daily. Use as instructed    Dispense:  100 each    Refill:  12  . levothyroxine (SYNTHROID, LEVOTHROID) 50 MCG tablet    Sig: Take 1 tablet (50 mcg total) by mouth daily.    Dispense:  90 tablet    Refill:  2  . metFORMIN (GLUCOPHAGE-XR) 500 MG 24 hr tablet    Sig: Take 2 tablets (1,000 mg total) by mouth daily with breakfast.    Dispense:  180 tablet    Refill:  2  . metoprolol succinate (TOPROL-XL) 25 MG 24 hr tablet    Sig: Take 1 tablet (25 mg total) by mouth daily.    Dispense:  90 tablet    Refill:  2  . sertraline (ZOLOFT) 50 MG tablet    Sig: Take 1 tablet (50 mg total) by mouth daily.    Dispense:  90 tablet    Refill:  2  . TRUEPLUS LANCETS 28G MISC    Sig: 1 each by Does not apply route 3 (three) times daily.    Dispense:  100 each    Refill:  12  . amoxicillin (AMOXIL) 500 MG capsule    Sig: Take 1 capsule (500 mg total) by mouth 3 (three) times daily.    Dispense:  30 capsule    Refill:  0    Follow-up: Return in about 3 months (around 03/20/2016) for HTN and DM2.   Boykin Nearing MD

## 2015-12-20 LAB — COMPLETE METABOLIC PANEL WITH GFR
ALBUMIN: 4.4 g/dL (ref 3.6–5.1)
ALT: 13 U/L (ref 6–29)
AST: 11 U/L (ref 10–35)
Alkaline Phosphatase: 75 U/L (ref 33–130)
BILIRUBIN TOTAL: 0.3 mg/dL (ref 0.2–1.2)
BUN: 12 mg/dL (ref 7–25)
CO2: 26 mmol/L (ref 20–31)
CREATININE: 1.1 mg/dL — AB (ref 0.50–1.05)
Calcium: 10.1 mg/dL (ref 8.6–10.4)
Chloride: 105 mmol/L (ref 98–110)
GFR, EST AFRICAN AMERICAN: 65 mL/min (ref >=60)
GFR, Est Non African American: 57 mL/min — ABNORMAL LOW (ref >=60)
Glucose, Bld: 89 mg/dL (ref 65–99)
Potassium: 4.9 mmol/L (ref 3.5–5.3)
Sodium: 142 mmol/L (ref 135–146)
TOTAL PROTEIN: 7.6 g/dL (ref 6.1–8.1)

## 2015-12-20 LAB — HEPATITIS C ANTIBODY: HCV Ab: NEGATIVE

## 2015-12-20 LAB — LIPID PANEL
CHOLESTEROL: 170 mg/dL (ref 125–200)
HDL: 40 mg/dL — ABNORMAL LOW (ref >=46)
LDL CALC: 110 mg/dL (ref <130)
TRIGLYCERIDES: 101 mg/dL (ref <150)
Total CHOL/HDL Ratio: 4.3 Ratio (ref <=5.0)
VLDL: 20 mg/dL (ref <30)

## 2015-12-22 DIAGNOSIS — K0530 Chronic periodontitis, unspecified: Secondary | ICD-10-CM | POA: Insufficient documentation

## 2015-12-22 NOTE — Assessment & Plan Note (Signed)
Loose tooth with pain periodontitis  Plabn: amox Dental referral

## 2015-12-22 NOTE — Assessment & Plan Note (Signed)
Well controlled. Continue metformin.

## 2015-12-22 NOTE — Assessment & Plan Note (Signed)
BP well controlled Med: compliant Continue current regimen

## 2016-01-09 MED FILL — SERTRALINE HCL 50 MG TABLET: 50 | 30 days supply | Qty: 30 | Fill #2

## 2016-01-23 MED FILL — ?LEVOTHYROXINE 50 MCG TABLE: 50 | 30 days supply | Qty: 30 | Fill #3

## 2016-01-23 MED FILL — METOPROLOL SUCC ER 25 MG TA: 25 | 30 days supply | Qty: 30 | Fill #1

## 2016-01-23 MED FILL — ?ATORVASTATIN 20 MG TABLET: 20 | 30 days supply | Qty: 30 | Fill #1

## 2016-01-23 MED FILL — METFORMIN HCL ER 500 MG TAB: 500 | 30 days supply | Qty: 60 | Fill #1

## 2016-02-18 MED FILL — SERTRALINE HCL 50 MG TABLET: 50 | 30 days supply | Qty: 30 | Fill #0

## 2016-02-25 ENCOUNTER — Other Ambulatory Visit: Payer: Self-pay | Admitting: Family Medicine

## 2016-02-25 MED FILL — METOPROLOL SUCC ER 25 MG TA: 25 | 30 days supply | Qty: 30 | Fill #2

## 2016-02-25 MED FILL — ATORVASTATIN 20 MG TABLET: 20 | 30 days supply | Qty: 30 | Fill #2

## 2016-02-25 MED FILL — METFORMIN HCL ER 500 MG TAB: 500 | 30 days supply | Qty: 60 | Fill #2

## 2016-02-28 MED FILL — LEVOTHYROXINE 50 MCG TABLET: 50 | 30 days supply | Qty: 30 | Fill #0

## 2016-03-04 ENCOUNTER — Encounter: Payer: Self-pay | Admitting: Family Medicine

## 2016-03-19 MED FILL — ?SERTRALINE HCL 50 MG TABLE: 50 | 30 days supply | Qty: 30 | Fill #1

## 2016-03-30 MED FILL — METFORMIN HCL ER 500 MG TAB: 500 | 30 days supply | Qty: 60 | Fill #3

## 2016-03-30 MED FILL — ATORVASTATIN 20 MG TABLET: 20 | 30 days supply | Qty: 30 | Fill #3

## 2016-03-30 MED FILL — METOPROLOL SUCC ER 25 MG TA: 25 | 30 days supply | Qty: 30 | Fill #3

## 2016-03-30 MED FILL — ?LEVOTHYROXINE 50 MCG TABLE: 50 | 30 days supply | Qty: 30 | Fill #1

## 2016-04-23 MED FILL — ?SERTRALINE HCL 50 MG TABLE: 50 | 30 days supply | Qty: 30 | Fill #2

## 2016-05-01 ENCOUNTER — Other Ambulatory Visit: Payer: Self-pay | Admitting: Obstetrics and Gynecology

## 2016-05-01 DIAGNOSIS — Z1231 Encounter for screening mammogram for malignant neoplasm of breast: Secondary | ICD-10-CM

## 2016-05-01 MED FILL — ATORVASTATIN 20 MG TABLET: 20 | 30 days supply | Qty: 30 | Fill #4

## 2016-05-01 MED FILL — METOPROLOL SUCC ER 25 MG TA: 25 | 30 days supply | Qty: 30 | Fill #4

## 2016-05-01 MED FILL — METFORMIN HCL ER 500 MG TAB: 500 | 30 days supply | Qty: 60 | Fill #4

## 2016-05-01 MED FILL — LEVOTHYROXINE 50 MCG TABLET: 50 | 30 days supply | Qty: 30 | Fill #2

## 2016-05-26 MED FILL — ?SERTRALINE HCL 50 MG TABLE: 50 | 30 days supply | Qty: 30 | Fill #3

## 2016-06-04 ENCOUNTER — Ambulatory Visit (HOSPITAL_COMMUNITY)
Admission: RE | Admit: 2016-06-04 | Discharge: 2016-06-04 | Disposition: A | Discharge status: Home / Self Care | Payer: Self-pay | Source: Ambulatory Visit | Attending: Obstetrics and Gynecology | Admitting: Obstetrics and Gynecology

## 2016-06-04 ENCOUNTER — Encounter (HOSPITAL_COMMUNITY): Payer: Self-pay

## 2016-06-04 ENCOUNTER — Ambulatory Visit
Admission: RE | Admit: 2016-06-04 | Discharge: 2016-06-04 | Disposition: A | Discharge status: Home / Self Care | Payer: No Typology Code available for payment source | Source: Ambulatory Visit | Attending: Obstetrics and Gynecology | Admitting: Obstetrics and Gynecology

## 2016-06-04 VITALS — BP 130/82 | Temp 98.2°F | Ht 67.0 in | Wt 205.4 lb

## 2016-06-04 DIAGNOSIS — Z1231 Encounter for screening mammogram for malignant neoplasm of breast: Secondary | ICD-10-CM

## 2016-06-04 DIAGNOSIS — Z1239 Encounter for other screening for malignant neoplasm of breast: Secondary | ICD-10-CM

## 2016-06-04 MED FILL — METOPROLOL SUCC ER 25 MG TA: 25 | 30 days supply | Qty: 30 | Fill #5

## 2016-06-04 MED FILL — LEVOTHYROXINE 50 MCG TABLET: 50 | 30 days supply | Qty: 30 | Fill #3

## 2016-06-04 MED FILL — METFORMIN HCL ER 500 MG TAB: 500 | 30 days supply | Qty: 60 | Fill #5

## 2016-06-04 MED FILL — ?ATORVASTATIN 20 MG TABLET: 20 | 30 days supply | Qty: 30 | Fill #5

## 2016-06-04 NOTE — Progress Notes (Signed)
No complaints today.   Pap Smear:  Pap smear not completed today. Last Pap smear was 02/13/2014 at [facility] and normal with negative HPV. Patients prior Pap smears on 11/19/2012 and 08/18/2011 at [facility] clinic were normal. Per patient had an abnormal Pap smear in 2012 that required a colposcopy for follow up 10/06/2010. Per patients previous  has had around 3 other abnormal Pap smears prior to the abnormal Pap smear in 2012. Patient stated she has a history of cryo and coloposcopy prior for last abnormal Pap smear. Last Pap smear result is in EPIC.   Physical exam: Breasts Breasts symmetrical. No skin abnormalities bilateral breasts. No nipple retraction bilateral breasts. No nipple discharge bilateral breasts. No lymphadenopathy. No lumps palpated bilateral breasts. No complaints of pain or tenderness on exam. Referred patient to the [facility] for a screening mammogram. Appointment scheduled for Thursday, June 04, 2016 at 0850.        Pelvic/Bimanual No Pap smear completed today since last Pap smear and HPV typing was 02/13/2014. Pap smear not indicated per BCCCP guidelines.   Smoking History: Patient has never smoked.  Patient Navigation: Patient education provided. Access to services provided for patient through [facility] program.   Colorectal Cancer Screening: Per patient has never had a colonoscopy completed. No complaints today.

## 2016-06-04 NOTE — Patient Instructions (Signed)
Explained breast self awareness to Heather Bailey. Patient did not need a Pap smear today due to last Pap smear and HPV typing was 02/13/2014. Let her know BCCCP will cover Pap smears and HPV typing every 5 years unless has a history of abnormal Pap smears. Referred patient to the [facility] for a screening mammogram. Appointment scheduled for Thursday, June 04, 2016 at 0850. Let [facility] know [facility] will follow up with her within the next couple weeks with results of mammogram by letter or phone. Heather Bailey verbalized understanding.  Adele Milson, Arvil Chaco, RN 8:49 AM

## 2016-06-10 ENCOUNTER — Encounter (HOSPITAL_COMMUNITY): Payer: Self-pay | Admitting: *Deleted

## 2016-06-30 MED FILL — ?SERTRALINE HCL 50 MG TABLE: 50 | 30 days supply | Qty: 30 | Fill #4

## 2016-07-07 MED FILL — METFORMIN HCL ER 500 MG TAB: 500 | 30 days supply | Qty: 60 | Fill #6

## 2016-07-07 MED FILL — ATORVASTATIN 20 MG TABLET: 20 | 30 days supply | Qty: 30 | Fill #6

## 2016-07-07 MED FILL — METOPROLOL SUCC ER 25 MG TA: 25 | 30 days supply | Qty: 30 | Fill #6

## 2016-07-07 MED FILL — LEVOTHYROXINE 50 MCG TABLET: 50 | 30 days supply | Qty: 30 | Fill #4

## 2016-08-03 MED FILL — ?SERTRALINE HCL 50 MG TABLE: 50 | 30 days supply | Qty: 30 | Fill #5

## 2016-08-10 MED FILL — ATORVASTATIN 20 MG TABLET: 20 | 30 days supply | Qty: 30 | Fill #7

## 2016-08-10 MED FILL — METFORMIN HCL ER 500 MG TAB: 500 | 30 days supply | Qty: 60 | Fill #7

## 2016-08-10 MED FILL — LEVOTHYROXINE 50 MCG TABLET: 50 | 30 days supply | Qty: 30 | Fill #5

## 2016-08-10 MED FILL — METOPROLOL SUCC ER 25 MG TA: 25 | 30 days supply | Qty: 30 | Fill #7

## 2016-08-21 ENCOUNTER — Encounter: Payer: Self-pay | Admitting: Family Medicine

## 2016-08-21 ENCOUNTER — Ambulatory Visit: Payer: Self-pay | Attending: Family Medicine | Admitting: Family Medicine

## 2016-08-21 VITALS — BP 126/86 | HR 78 | Temp 98.2°F | Ht 67.0 in | Wt 209.0 lb

## 2016-08-21 DIAGNOSIS — F329 Major depressive disorder, single episode, unspecified: Secondary | ICD-10-CM | POA: Insufficient documentation

## 2016-08-21 DIAGNOSIS — F32A Depression, unspecified: Secondary | ICD-10-CM

## 2016-08-21 DIAGNOSIS — H04123 Dry eye syndrome of bilateral lacrimal glands: Secondary | ICD-10-CM

## 2016-08-21 DIAGNOSIS — M545 Low back pain: Secondary | ICD-10-CM | POA: Insufficient documentation

## 2016-08-21 DIAGNOSIS — I10 Essential (primary) hypertension: Secondary | ICD-10-CM | POA: Insufficient documentation

## 2016-08-21 DIAGNOSIS — Z7984 Long term (current) use of oral hypoglycemic drugs: Secondary | ICD-10-CM | POA: Insufficient documentation

## 2016-08-21 DIAGNOSIS — E119 Type 2 diabetes mellitus without complications: Secondary | ICD-10-CM | POA: Insufficient documentation

## 2016-08-21 DIAGNOSIS — R51 Headache: Secondary | ICD-10-CM | POA: Insufficient documentation

## 2016-08-21 DIAGNOSIS — G8929 Other chronic pain: Secondary | ICD-10-CM | POA: Insufficient documentation

## 2016-08-21 DIAGNOSIS — E039 Hypothyroidism, unspecified: Secondary | ICD-10-CM | POA: Insufficient documentation

## 2016-08-21 DIAGNOSIS — E7800 Pure hypercholesterolemia, unspecified: Secondary | ICD-10-CM | POA: Insufficient documentation

## 2016-08-21 DIAGNOSIS — Z0000 Encounter for general adult medical examination without abnormal findings: Secondary | ICD-10-CM

## 2016-08-21 LAB — POCT GLYCOSYLATED HEMOGLOBIN (HGB A1C): HEMOGLOBIN A1C: 6.1

## 2016-08-21 LAB — GLUCOSE, POCT (MANUAL RESULT ENTRY): POC Glucose: 91 mg/dl (ref 70–99)

## 2016-08-21 LAB — TSH: TSH: 2.14 m[IU]/L

## 2016-08-21 MED ORDER — SERTRALINE HCL 50 MG PO TABS: 50.0000 mg | ORAL_TABLET | Freq: Every day | ORAL | 2 refills | Status: DC

## 2016-08-21 MED ORDER — CYCLOSPORINE 0.05 % OP EMUL: 1.0000 [drp] | Freq: Two times a day (BID) | OPHTHALMIC | 4 refills | Status: DC

## 2016-08-21 MED ORDER — CYCLOSPORINE 0.05 % OP EMUL: 1.0000 [drp] | Freq: Two times a day (BID) | OPHTHALMIC | 11 refills | Status: DC

## 2016-08-21 MED ORDER — ATORVASTATIN CALCIUM 20 MG PO TABS: 20.0000 mg | ORAL_TABLET | Freq: Every day | ORAL | 2 refills | Status: DC

## 2016-08-21 MED ORDER — GABAPENTIN 300 MG PO CAPS: 300.0000 mg | ORAL_CAPSULE | Freq: Every day | ORAL | 2 refills | Status: DC

## 2016-08-21 MED ORDER — METOPROLOL SUCCINATE ER 25 MG PO TB24: 25.0000 mg | ORAL_TABLET | Freq: Every day | ORAL | 2 refills | Status: DC

## 2016-08-21 MED ORDER — METFORMIN HCL ER 500 MG PO TB24: 1000.0000 mg | ORAL_TABLET | Freq: Every day | ORAL | 2 refills | Status: DC

## 2016-08-21 NOTE — Patient Instructions (Addendum)
Heather Bailey was seen today for diabetes and hypertension.  Diagnoses and all orders for this visit:  Type 2 diabetes mellitus without complication, without long-term current use of insulin (HCC) -     POCT glucose (manual entry) -     POCT glycosylated hemoglobin (Hb A1C) -     Microalbumin/Creatinine Ratio, Urine -     atorvastatin (LIPITOR) 20 MG tablet; Take 1 tablet (20 mg total) by mouth daily. -     metFORMIN (GLUCOPHAGE-XR) 500 MG 24 hr tablet; Take 2 tablets (1,000 mg total) by mouth daily with breakfast.  Hypothyroidism, unspecified type -     TSH  Chronic dryness of both eyes -     Discontinue: cycloSPORINE (RESTASIS) 0.05 % ophthalmic emulsion; Place 1 drop into both eyes 2 (two) times daily. For PASS -     cycloSPORINE (RESTASIS) 0.05 % ophthalmic emulsion; Place 1 drop into both eyes 2 (two) times daily. For PASS -     cycloSPORINE (RESTASIS) 0.05 % ophthalmic emulsion; Place 1 drop into both eyes 2 (two) times daily.  Periodontitis  Pure hypercholesterolemia -     atorvastatin (LIPITOR) 20 MG tablet; Take 1 tablet (20 mg total) by mouth daily.  Chronic low back pain, unspecified back pain laterality, with sciatica presence unspecified -     gabapentin (NEURONTIN) 300 MG capsule; Take 1 capsule (300 mg total) by mouth at bedtime.  Essential hypertension -     metoprolol succinate (TOPROL-XL) 25 MG 24 hr tablet; Take 1 tablet (25 mg total) by mouth daily.  Depression, unspecified depression type -     sertraline (ZOLOFT) 50 MG tablet; Take 1 tablet (50 mg total) by mouth daily.  Healthcare maintenance -     Ambulatory referral to Gastroenterology   Take aleve for next 2-3 days for headache  Decrease caffeine intake Re-apply for the orange card  F/u in 3 months for HTN and diabetes   Dr. Adrian Blackwater

## 2016-08-21 NOTE — Progress Notes (Signed)
Subjective:  Patient ID: Heather Bailey, female    DOB: Dec 26, 1960  Age: 56 y.o. MRN: [id number]  CC: Diabetes and Hypertension   HPI Heather Bailey presents for    1. HTN: compliant with regimen. Has HA. Reports headaches were terrible. Worsened about 2 weeks ago. She takes nightly benadryl which does help.  Had elevated BP at home 140/97-130/95. No CP, SOB or lege swelling.   2. DM2: taking metformin. No GI upset. No vision changes, polyuria, polydipsia, or tingling or numbness in extremities.   Social History  Substance Use Topics  . Smoking status: Never Smoker  . Smokeless tobacco: Never Used  . Alcohol use No   Outpatient Medications Prior to Visit  Medication Sig Dispense Refill  . atorvastatin (LIPITOR) 20 MG tablet Take 1 tablet (20 mg total) by mouth daily. 90 tablet 2  . Blood Glucose Monitoring Suppl (TRUERESULT BLOOD GLUCOSE) w/Device KIT 1 each by Does not apply route 3 (three) times daily before meals. 1 each 0  . cycloSPORINE (RESTASIS) 0.05 % ophthalmic emulsion Place 1 drop into both eyes 2 (two) times daily. 0.4 mL 11  . gabapentin (NEURONTIN) 300 MG capsule Take 1 capsule (300 mg total) by mouth at bedtime. 90 capsule 2  . glucose blood (TRUETEST TEST) test strip 1 each by Other route 3 (three) times daily. Use as instructed 100 each 12  . levothyroxine (SYNTHROID, LEVOTHROID) 50 MCG tablet Take 1 tablet (50 mcg total) by mouth daily. 90 tablet 2  . metFORMIN (GLUCOPHAGE-XR) 500 MG 24 hr tablet Take 2 tablets (1,000 mg total) by mouth daily with breakfast. 180 tablet 2  . metoprolol succinate (TOPROL-XL) 25 MG 24 hr tablet Take 1 tablet (25 mg total) by mouth daily. 90 tablet 2  . sertraline (ZOLOFT) 50 MG tablet Take 1 tablet (50 mg total) by mouth daily. 90 tablet 2  . TRUEPLUS LANCETS 28G MISC 1 each by Does not apply route 3 (three) times daily. 100 each 12  . amoxicillin (AMOXIL) 500 MG capsule Take 1 capsule (500 mg total) by mouth 3 (three) times daily.  (Patient not taking: Reported on 06/04/2016) 30 capsule 0   No facility-administered medications prior to visit.     ROS Review of Systems  Constitutional: Negative for chills and fever.  Eyes: Negative for visual disturbance.  Respiratory: Negative for shortness of breath.   Cardiovascular: Negative for chest pain.  Gastrointestinal: Negative for abdominal pain and blood in stool.  Musculoskeletal: Negative for arthralgias and back pain.  Skin: Negative for rash.  Allergic/Immunologic: Negative for immunocompromised state.  Neurological: Positive for headaches.  Hematological: Negative for adenopathy. Does not bruise/bleed easily.  Psychiatric/Behavioral: Negative for dysphoric mood and suicidal ideas.    Objective:  BP 126/86 (BP Location: Left Arm, Patient Position: Sitting, Cuff Size: Small)   Pulse 78   Temp 98.2 F (36.8 C) (Oral)   Ht _0  (1.702 m)   Wt 209 lb (94.8 kg)   SpO2 99%   BMI 32.73 kg/m   BP/Weight 08/21/2016 70/35/0093 01/13/8298  Systolic BP 371 696 789  Diastolic BP 86 82 80  Wt. (Lbs) 209 205.4 206  BMI 32.73 32.17 32.26    Physical Exam  Constitutional: She is oriented to person, place, and time. She appears well-developed and well-nourished. No distress.  HENT:  Head: Normocephalic and atraumatic.  Neck: No thyromegaly present.  Cardiovascular: Normal rate, regular rhythm, normal heart sounds and intact distal pulses.   Pulmonary/Chest: Effort normal  and breath sounds normal.  Musculoskeletal: She exhibits no edema.  Neurological: She is alert and oriented to person, place, and time. No cranial nerve deficit. She exhibits normal muscle tone. Coordination normal.  Skin: Skin is warm and dry. No rash noted.  Psychiatric: She has a normal mood and affect.    Lab Results  Component Value Date   HGBA1C 6.1 08/21/2016   CBG 91  Assessment & Plan:   There are no diagnoses linked to this encounter.  Heather Bailey was seen today for diabetes and  hypertension.  Diagnoses and all orders for this visit:  Type 2 diabetes mellitus without complication, without long-term current use of insulin (HCC) -     POCT glucose (manual entry) -     POCT glycosylated hemoglobin (Hb A1C) -     Microalbumin/Creatinine Ratio, Urine -     atorvastatin (LIPITOR) 20 MG tablet; Take 1 tablet (20 mg total) by mouth daily. -     metFORMIN (GLUCOPHAGE-XR) 500 MG 24 hr tablet; Take 2 tablets (1,000 mg total) by mouth daily with breakfast.  Hypothyroidism, unspecified type -     TSH  Chronic dryness of both eyes -     Discontinue: cycloSPORINE (RESTASIS) 0.05 % ophthalmic emulsion; Place 1 drop into both eyes 2 (two) times daily. For PASS -     cycloSPORINE (RESTASIS) 0.05 % ophthalmic emulsion; Place 1 drop into both eyes 2 (two) times daily. For PASS -     cycloSPORINE (RESTASIS) 0.05 % ophthalmic emulsion; Place 1 drop into both eyes 2 (two) times daily.  Pure hypercholesterolemia -     atorvastatin (LIPITOR) 20 MG tablet; Take 1 tablet (20 mg total) by mouth daily.  Chronic low back pain, unspecified back pain laterality, with sciatica presence unspecified -     gabapentin (NEURONTIN) 300 MG capsule; Take 1 capsule (300 mg total) by mouth at bedtime.  Essential hypertension -     metoprolol succinate (TOPROL-XL) 25 MG 24 hr tablet; Take 1 tablet (25 mg total) by mouth daily.  Depression, unspecified depression type -     sertraline (ZOLOFT) 50 MG tablet; Take 1 tablet (50 mg total) by mouth daily.  Healthcare maintenance -     Ambulatory referral to Gastroenterology  Other orders -     levothyroxine (SYNTHROID, LEVOTHROID) 50 MCG tablet; Take 1 tablet (50 mcg total) by mouth daily.   No orders of the defined types were placed in this encounter.   Follow-up: Return in about 3 months (around 11/21/2016) for HTN and diabetes .   Boykin Nearing MD

## 2016-08-22 LAB — MICROALBUMIN / CREATININE URINE RATIO
Creatinine, Urine: 101 mg/dL (ref 20–320)
MICROALB UR: 0.2 mg/dL
Microalb Creat Ratio: 2 mcg/mg creat (ref <30)

## 2016-08-25 MED ORDER — LEVOTHYROXINE SODIUM 50 MCG PO TABS: 50.0000 ug | ORAL_TABLET | Freq: Every day | ORAL | 2 refills | Status: DC

## 2016-08-25 NOTE — Assessment & Plan Note (Signed)
Normal TSH Refilled synthroid

## 2016-08-25 NOTE — Assessment & Plan Note (Signed)
Normal BP in office Continue toprol XL 25 mg daily

## 2016-08-25 NOTE — Assessment & Plan Note (Signed)
Well controlled Continue metformin and Lipitor

## 2016-09-03 MED FILL — ?SERTRALINE HCL 50 MG TABLE: 50 | 30 days supply | Qty: 30 | Fill #6

## 2016-09-03 MED FILL — LEVOTHYROXINE 50 MCG TABLET: 50 | 30 days supply | Qty: 30 | Fill #6

## 2016-09-03 MED FILL — METFORMIN HCL ER 500 MG TAB: 500 | 30 days supply | Qty: 60 | Fill #8

## 2016-09-03 MED FILL — ATORVASTATIN 20 MG TABLET: 20 | 30 days supply | Qty: 30 | Fill #8

## 2016-09-03 MED FILL — METOPROLOL SUCC ER 25 MG TA: 25 | 30 days supply | Qty: 30 | Fill #8

## 2016-10-07 ENCOUNTER — Other Ambulatory Visit: Payer: Self-pay | Admitting: Family Medicine

## 2016-10-07 DIAGNOSIS — E119 Type 2 diabetes mellitus without complications: Secondary | ICD-10-CM

## 2016-10-07 DIAGNOSIS — E785 Hyperlipidemia, unspecified: Secondary | ICD-10-CM

## 2016-10-07 DIAGNOSIS — I10 Essential (primary) hypertension: Secondary | ICD-10-CM

## 2016-10-07 MED FILL — LEVOTHYROXINE 50 MCG TABLET: 50 | 30 days supply | Qty: 30 | Fill #7

## 2016-10-07 MED FILL — ?SERTRALINE HCL 50 MG TABLE: 50 | 30 days supply | Qty: 30 | Fill #7

## 2016-10-09 MED FILL — ATORVASTATIN 20 MG TABLET: 20 | 30 days supply | Qty: 30 | Fill #0

## 2016-10-14 MED FILL — METOPROLOL SUCC ER 25 MG TA: 25 | 30 days supply | Qty: 30 | Fill #0

## 2016-10-14 MED FILL — METFORMIN HCL ER 500 MG TAB: 500 | 30 days supply | Qty: 60 | Fill #0

## 2016-10-28 ENCOUNTER — Encounter: Payer: Self-pay | Admitting: Family Medicine

## 2016-11-12 ENCOUNTER — Other Ambulatory Visit: Payer: Self-pay | Admitting: Family Medicine

## 2016-11-12 DIAGNOSIS — F329 Major depressive disorder, single episode, unspecified: Secondary | ICD-10-CM

## 2016-11-12 DIAGNOSIS — E039 Hypothyroidism, unspecified: Secondary | ICD-10-CM

## 2016-11-12 DIAGNOSIS — F32A Depression, unspecified: Secondary | ICD-10-CM

## 2016-11-12 MED FILL — METFORMIN HCL ER 500 MG TAB: 500 | 30 days supply | Qty: 60 | Fill #1

## 2016-11-12 MED FILL — LEVOTHYROXINE 50 MCG TABLET: 50 | 30 days supply | Qty: 30 | Fill #8

## 2016-11-12 MED FILL — ?ATORVASTATIN 20 MG TABLET: 20 | 30 days supply | Qty: 30 | Fill #1

## 2016-11-12 MED FILL — METOPROLOL SUCC ER 25 MG TA: 25 | 30 days supply | Qty: 30 | Fill #1

## 2016-11-12 MED FILL — ?SERTRALINE HCL 50 MG TABLE: 50 | 30 days supply | Qty: 30 | Fill #8

## 2016-11-20 ENCOUNTER — Ambulatory Visit: Payer: No Typology Code available for payment source | Admitting: Family Medicine

## 2016-11-23 ENCOUNTER — Encounter: Payer: Self-pay | Admitting: Family Medicine

## 2016-11-23 ENCOUNTER — Ambulatory Visit: Payer: Self-pay | Attending: Family Medicine | Admitting: Family Medicine

## 2016-11-23 VITALS — BP 122/80 | HR 76 | Temp 98.3°F | Wt 211.6 lb

## 2016-11-23 DIAGNOSIS — I10 Essential (primary) hypertension: Secondary | ICD-10-CM | POA: Insufficient documentation

## 2016-11-23 DIAGNOSIS — Z7984 Long term (current) use of oral hypoglycemic drugs: Secondary | ICD-10-CM | POA: Insufficient documentation

## 2016-11-23 DIAGNOSIS — E785 Hyperlipidemia, unspecified: Secondary | ICD-10-CM | POA: Insufficient documentation

## 2016-11-23 DIAGNOSIS — E119 Type 2 diabetes mellitus without complications: Secondary | ICD-10-CM | POA: Insufficient documentation

## 2016-11-23 DIAGNOSIS — Z23 Encounter for immunization: Secondary | ICD-10-CM | POA: Insufficient documentation

## 2016-11-23 LAB — GLUCOSE, POCT (MANUAL RESULT ENTRY): POC GLUCOSE: 89 mg/dL (ref 70–99)

## 2016-11-23 LAB — POCT GLYCOSYLATED HEMOGLOBIN (HGB A1C): HEMOGLOBIN A1C: 6.3

## 2016-11-23 NOTE — Patient Instructions (Addendum)
Heather Bailey was seen today for hypertension and diabetes.  Diagnoses and all orders for this visit:  Type 2 diabetes mellitus without complication, without long-term current use of insulin (HCC) -     POCT glucose (manual entry) -     CMP14+EGFR -     HgB A1c  Hyperlipidemia, unspecified hyperlipidemia type -     Lipid Panel   Check out this website for more information on keto diet [email or web address]  Please apply for [place] discount and orange card, you can also inquire if any of your medications are on the PASS (medications assistance) list.   F/u in 3 months for HTN and diabetes  Dr. Adrian Blackwater

## 2016-11-23 NOTE — Assessment & Plan Note (Signed)
Well controlled BP Med: compliant  Continue metoprolol 25 mg daily

## 2016-11-23 NOTE — Progress Notes (Signed)
Subjective:  Patient ID: Heather Bailey, female    DOB: 1961-02-05  Age: 56 y.o. MRN: [id number]  CC: Hypertension and Diabetes   HPI Heather Bailey presents for    1. HTN: compliant with regimen. Denies pounding headache, HA, CP, SOB.     2. DM2: taking metformin. No GI upset. No vision changes, polyuria, polydipsia, or tingling or numbness in extremities.   Social History  Substance Use Topics  . Smoking status: Never Smoker  . Smokeless tobacco: Never Used  . Alcohol use No   Outpatient Medications Prior to Visit  Medication Sig Dispense Refill  . atorvastatin (LIPITOR) 20 MG tablet Take 1 tablet (20 mg total) by mouth daily. 90 tablet 2  . Blood Glucose Monitoring Suppl (TRUERESULT BLOOD GLUCOSE) w/Device KIT 1 each by Does not apply route 3 (three) times daily before meals. 1 each 0  . cycloSPORINE (RESTASIS) 0.05 % ophthalmic emulsion Place 1 drop into both eyes 2 (two) times daily. For PASS 3 each 4  . cycloSPORINE (RESTASIS) 0.05 % ophthalmic emulsion Place 1 drop into both eyes 2 (two) times daily. 0.4 mL 11  . gabapentin (NEURONTIN) 300 MG capsule Take 1 capsule (300 mg total) by mouth at bedtime. 90 capsule 2  . glucose blood (TRUETEST TEST) test strip 1 each by Other route 3 (three) times daily. Use as instructed 100 each 12  . levothyroxine (SYNTHROID, LEVOTHROID) 50 MCG tablet Take 1 tablet (50 mcg total) by mouth daily. 90 tablet 2  . metFORMIN (GLUCOPHAGE-XR) 500 MG 24 hr tablet Take 2 tablets (1,000 mg total) by mouth daily with breakfast. 180 tablet 2  . metoprolol succinate (TOPROL-XL) 25 MG 24 hr tablet Take 1 tablet (25 mg total) by mouth daily. 90 tablet 2  . sertraline (ZOLOFT) 50 MG tablet Take 1 tablet (50 mg total) by mouth daily. 90 tablet 2  . TRUEPLUS LANCETS 28G MISC 1 each by Does not apply route 3 (three) times daily. 100 each 12   No facility-administered medications prior to visit.     ROS Review of Systems  Constitutional: Negative for chills  and fever.  Eyes: Negative for visual disturbance.  Respiratory: Negative for shortness of breath.   Cardiovascular: Negative for chest pain.  Gastrointestinal: Positive for abdominal pain. Negative for blood in stool.  Musculoskeletal: Positive for back pain. Negative for arthralgias.  Skin: Negative for rash.  Allergic/Immunologic: Negative for immunocompromised state.  Neurological: Negative for headaches.  Hematological: Negative for adenopathy. Does not bruise/bleed easily.  Psychiatric/Behavioral: Negative for dysphoric mood and suicidal ideas.    Objective:  BP 122/80   Pulse 76   Temp 98.3 F (36.8 C) (Oral)   Wt 211 lb 9.6 oz (96 kg)   SpO2 97%   BMI 33.14 kg/m   BP/Weight 11/23/2016 08/21/2016 16/94/5038  Systolic BP 882 800 349  Diastolic BP 80 86 82  Wt. (Lbs) 211.6 209 205.4  BMI 33.14 32.73 32.17    Physical Exam  Constitutional: She is oriented to person, place, and time. She appears well-developed and well-nourished. No distress.  HENT:  Head: Normocephalic and atraumatic.  Neck: No thyromegaly present.  Cardiovascular: Normal rate, regular rhythm, normal heart sounds and intact distal pulses.   Pulmonary/Chest: Effort normal and breath sounds normal.  Musculoskeletal: She exhibits no edema.  Neurological: She is alert and oriented to person, place, and time. No cranial nerve deficit. She exhibits normal muscle tone. Coordination normal.  Skin: Skin is warm and dry. No  rash noted.  Psychiatric: She has a normal mood and affect.   Lab Results  Component Value Date   TSH 2.14 08/21/2016    Lab Results  Component Value Date   HGBA1C 6.1 08/21/2016   Lab Results  Component Value Date   HGBA1C 6.3 11/23/2016    CBG 89 Assessment & Plan:  Heather Bailey was seen today for hypertension and diabetes.  Diagnoses and all orders for this visit:  Type 2 diabetes mellitus without complication, without long-term current use of insulin (HCC) -     POCT glucose  (manual entry) -     CMP14+EGFR -     HgB A1c  Hyperlipidemia, unspecified hyperlipidemia type -     Lipid Panel  Essential hypertension  Other orders -     Pneumococcal polysaccharide vaccine 23-valent greater than or equal to 2yo subcutaneous/IM -     Tdap vaccine greater than or equal to 7yo IM   There are no diagnoses linked to this encounter.  Heather Bailey was seen today for hypertension and diabetes.  Diagnoses and all orders for this visit:  Type 2 diabetes mellitus without complication, without long-term current use of insulin (HCC) -     POCT glucose (manual entry)   No orders of the defined types were placed in this encounter.   Follow-up: Return in about 3 months (around 02/23/2017) for HTN and diabetes.   Boykin Nearing MD

## 2016-11-23 NOTE — Assessment & Plan Note (Signed)
A: well controlled Med: compliant P: Continue metformin

## 2016-11-23 NOTE — Assessment & Plan Note (Signed)
associated with well controlled diabetes Continue aspirin and statin

## 2016-11-24 LAB — CMP14+EGFR
A/G RATIO: 1.8 (ref 1.2–2.2)
ALT: 17 IU/L (ref 0–32)
AST: 17 IU/L (ref 0–40)
Albumin: 4.8 g/dL (ref 3.5–5.5)
Alkaline Phosphatase: 92 IU/L (ref 39–117)
BUN/Creatinine Ratio: 15 (ref 9–23)
BUN: 15 mg/dL (ref 6–24)
Bilirubin Total: <0.2 mg/dL (ref 0.0–1.2)
CALCIUM: 10 mg/dL (ref 8.7–10.2)
CO2: 22 mmol/L (ref 20–29)
CREATININE: 0.98 mg/dL (ref 0.57–1.00)
Chloride: 104 mmol/L (ref 96–106)
GFR calc Af Amer: 75 mL/min/{1.73_m2} (ref >59)
GFR, EST NON AFRICAN AMERICAN: 65 mL/min/{1.73_m2} (ref >59)
Globulin, Total: 2.6 g/dL (ref 1.5–4.5)
Glucose: 87 mg/dL (ref 65–99)
POTASSIUM: 4.4 mmol/L (ref 3.5–5.2)
Sodium: 142 mmol/L (ref 134–144)
Total Protein: 7.4 g/dL (ref 6.0–8.5)

## 2016-11-24 LAB — LIPID PANEL
CHOL/HDL RATIO: 4 ratio (ref 0.0–4.4)
Cholesterol, Total: 158 mg/dL (ref 100–199)
HDL: 40 mg/dL (ref >39)
LDL Calculated: 91 mg/dL (ref 0–99)
Triglycerides: 135 mg/dL (ref 0–149)
VLDL Cholesterol Cal: 27 mg/dL (ref 5–40)

## 2016-12-03 ENCOUNTER — Telehealth: Payer: Self-pay

## 2016-12-03 NOTE — Telephone Encounter (Signed)
Pt was called and informed of lab results. 

## 2016-12-21 ENCOUNTER — Other Ambulatory Visit: Payer: Self-pay | Admitting: Family Medicine

## 2016-12-21 DIAGNOSIS — F329 Major depressive disorder, single episode, unspecified: Secondary | ICD-10-CM

## 2016-12-21 DIAGNOSIS — F32A Depression, unspecified: Secondary | ICD-10-CM

## 2016-12-21 DIAGNOSIS — E039 Hypothyroidism, unspecified: Secondary | ICD-10-CM

## 2016-12-21 MED FILL — ?ATORVASTATIN 20 MG TABLET: 20 | 30 days supply | Qty: 30 | Fill #2

## 2016-12-21 MED FILL — METFORMIN HCL ER 500 MG TAB: 500 | 30 days supply | Qty: 60 | Fill #2

## 2016-12-21 MED FILL — METOPROLOL SUCC ER 25 MG TA: 25 | 30 days supply | Qty: 30 | Fill #2

## 2016-12-22 ENCOUNTER — Other Ambulatory Visit: Payer: Self-pay | Admitting: Family Medicine

## 2016-12-22 DIAGNOSIS — E039 Hypothyroidism, unspecified: Secondary | ICD-10-CM

## 2016-12-22 DIAGNOSIS — F32A Depression, unspecified: Secondary | ICD-10-CM

## 2016-12-22 DIAGNOSIS — F329 Major depressive disorder, single episode, unspecified: Secondary | ICD-10-CM

## 2016-12-23 ENCOUNTER — Other Ambulatory Visit: Payer: Self-pay | Admitting: Family Medicine

## 2016-12-23 DIAGNOSIS — F32A Depression, unspecified: Secondary | ICD-10-CM

## 2016-12-23 DIAGNOSIS — E039 Hypothyroidism, unspecified: Secondary | ICD-10-CM

## 2016-12-23 DIAGNOSIS — F329 Major depressive disorder, single episode, unspecified: Secondary | ICD-10-CM

## 2016-12-24 MED FILL — ?SERTRALINE HCL 50 MG TAB: 50 | 30 days supply | Qty: 30 | Fill #0

## 2016-12-24 MED FILL — LEVOTHYROXINE 50 MCG TABLET: 50 | 30 days supply | Qty: 30 | Fill #0

## 2017-01-20 MED FILL — ATORVASTATIN 20 MG TABLET: 20 | 30 days supply | Qty: 30 | Fill #3

## 2017-01-20 MED FILL — LEVOTHYROXINE 50 MCG TABLET: 50 | 30 days supply | Qty: 30 | Fill #1

## 2017-01-20 MED FILL — METOPROLOL SUCC ER 25 MG TA: 25 | 30 days supply | Qty: 30 | Fill #3

## 2017-01-20 MED FILL — METFORMIN HCL ER 500 MG TAB: 500 | 30 days supply | Qty: 60 | Fill #3

## 2017-01-20 MED FILL — ?SERTRALINE HCL 50 MG TAB: 50 | 30 days supply | Qty: 30 | Fill #1

## 2017-02-23 ENCOUNTER — Ambulatory Visit: Payer: Self-pay | Attending: Internal Medicine | Admitting: Internal Medicine

## 2017-02-23 ENCOUNTER — Encounter: Payer: Self-pay | Admitting: Internal Medicine

## 2017-02-23 VITALS — BP 124/83 | HR 66 | Temp 98.3°F | Resp 18 | Ht 66.0 in | Wt 204.2 lb

## 2017-02-23 DIAGNOSIS — Z2821 Immunization not carried out because of patient refusal: Secondary | ICD-10-CM | POA: Insufficient documentation

## 2017-02-23 DIAGNOSIS — Z881 Allergy status to other antibiotic agents status: Secondary | ICD-10-CM | POA: Insufficient documentation

## 2017-02-23 DIAGNOSIS — E119 Type 2 diabetes mellitus without complications: Secondary | ICD-10-CM | POA: Insufficient documentation

## 2017-02-23 DIAGNOSIS — F329 Major depressive disorder, single episode, unspecified: Secondary | ICD-10-CM | POA: Insufficient documentation

## 2017-02-23 DIAGNOSIS — N951 Menopausal and female climacteric states: Secondary | ICD-10-CM | POA: Insufficient documentation

## 2017-02-23 DIAGNOSIS — F32A Depression, unspecified: Secondary | ICD-10-CM

## 2017-02-23 DIAGNOSIS — Z9851 Tubal ligation status: Secondary | ICD-10-CM | POA: Insufficient documentation

## 2017-02-23 DIAGNOSIS — Z9889 Other specified postprocedural states: Secondary | ICD-10-CM | POA: Insufficient documentation

## 2017-02-23 DIAGNOSIS — G8929 Other chronic pain: Secondary | ICD-10-CM | POA: Insufficient documentation

## 2017-02-23 DIAGNOSIS — I10 Essential (primary) hypertension: Secondary | ICD-10-CM | POA: Insufficient documentation

## 2017-02-23 DIAGNOSIS — M545 Low back pain: Secondary | ICD-10-CM | POA: Insufficient documentation

## 2017-02-23 DIAGNOSIS — R232 Flushing: Secondary | ICD-10-CM | POA: Insufficient documentation

## 2017-02-23 DIAGNOSIS — Z7984 Long term (current) use of oral hypoglycemic drugs: Secondary | ICD-10-CM | POA: Insufficient documentation

## 2017-02-23 DIAGNOSIS — Z1211 Encounter for screening for malignant neoplasm of colon: Secondary | ICD-10-CM | POA: Insufficient documentation

## 2017-02-23 DIAGNOSIS — E039 Hypothyroidism, unspecified: Secondary | ICD-10-CM | POA: Insufficient documentation

## 2017-02-23 DIAGNOSIS — Z800 Family history of malignant neoplasm of digestive organs: Secondary | ICD-10-CM | POA: Insufficient documentation

## 2017-02-23 DIAGNOSIS — Z8249 Family history of ischemic heart disease and other diseases of the circulatory system: Secondary | ICD-10-CM | POA: Insufficient documentation

## 2017-02-23 DIAGNOSIS — E785 Hyperlipidemia, unspecified: Secondary | ICD-10-CM | POA: Insufficient documentation

## 2017-02-23 LAB — GLUCOSE, POCT (MANUAL RESULT ENTRY): POC GLUCOSE: 96 mg/dL (ref 70–99)

## 2017-02-23 MED ORDER — METFORMIN HCL ER 500 MG PO TB24: 1000.0000 mg | ORAL_TABLET | Freq: Every day | ORAL | 2 refills | Status: DC

## 2017-02-23 MED ORDER — SERTRALINE HCL 50 MG PO TABS: 50.0000 mg | ORAL_TABLET | Freq: Every day | ORAL | 5 refills | Status: DC

## 2017-02-23 MED ORDER — LISINOPRIL 5 MG PO TABS: 5.0000 mg | ORAL_TABLET | Freq: Every day | ORAL | 3 refills | Status: DC

## 2017-02-23 MED ORDER — LEVOTHYROXINE SODIUM 50 MCG PO TABS: 50.0000 ug | ORAL_TABLET | Freq: Every day | ORAL | 5 refills | Status: DC

## 2017-02-23 MED ORDER — GABAPENTIN 300 MG PO CAPS: 300.0000 mg | ORAL_CAPSULE | Freq: Every day | ORAL | 2 refills | Status: DC

## 2017-02-23 MED ORDER — ATORVASTATIN CALCIUM 20 MG PO TABS: 20.0000 mg | ORAL_TABLET | Freq: Every day | ORAL | 2 refills | Status: DC

## 2017-02-23 MED FILL — ?SERTRALINE HCL 50 MG TAB: 50 | 30 days supply | Qty: 30 | Fill #0

## 2017-02-23 MED FILL — METFORMIN HCL ER 500 MG TAB: 500 | 30 days supply | Qty: 60 | Fill #0

## 2017-02-23 MED FILL — LISINOPRIL 5 MG TAB: 5 | 30 days supply | Qty: 30 | Fill #0

## 2017-02-23 MED FILL — LEVOTHYROXINE 50 MCG TABLET: 50 | 30 days supply | Qty: 30 | Fill #0

## 2017-02-23 MED FILL — ATORVASTATIN 20 MG TABLET: 20 | 30 days supply | Qty: 30 | Fill #0

## 2017-02-23 MED FILL — GABAPENTIN 300 MG CAPSULE: 300 | 30 days supply | Qty: 30 | Fill #0

## 2017-02-23 NOTE — Progress Notes (Signed)
Patient ID: Heather Bailey, female    DOB: 1961/03/23  MRN: [id number]  CC: No chief complaint on file.   Subjective: Heather Bailey is a 56 y.o. female who presents for chronic ds management. Last saw Dr. Adrian Blackwater 11/2016 Her concerns today include:  Pt with hx of HTN, DM, hypothyroidism, HL, depression, chronic LBP (on Gabapentin), dry skin.  1. C/o hot flushes getting worse "Use to hit me and go up my body but now it is  like my back is against a furance."  On Zoloft for depression and hot flushes -Zoloft helped but hot flashes started again within the past 6 mths. Worse in evenings and all throughout the night  2. DM: -compliant with Metformin -not checking BS regularly -"I'm eating too much carbs. I love potatoes and pasta." -Exercise: not as much as she would like.  Started doing planks with her son.  Walks up and down the stairs in her house several times a day for exercise. Has a stationary bike that she rides occasionally. Loss 7 lbs since last visit. She is presently surprised about this  3. HTN: -compliant with Metoprolol.  -limits salt in foods.  -no CP/SOB/HA/diziness  4. HL: tolerating Lipitor  HM: gets pap through [facility] through [facility]. she is due Due for colonoscopy but did not qualify for OC in past. Wants flu shot only if it is free  Patient Active Problem List   Diagnosis Date Noted  . Chronic low back pain 04/12/2015  . Dry skin 04/12/2015  . Chronic dryness of both eyes 08/28/2014  . DM2 (diabetes mellitus, type 2) ([place]) 03/19/2014  . Hypothyroidism 03/19/2014  . Hypertension 03/19/2014  . Hyperlipidemia 03/19/2014  . Depression 03/19/2014     Current Outpatient Prescriptions on File Prior to Visit  Medication Sig Dispense Refill  . atorvastatin (LIPITOR) 20 MG tablet Take 1 tablet (20 mg total) by mouth daily. 90 tablet 2  . Blood Glucose Monitoring Suppl (TRUERESULT BLOOD GLUCOSE) w/Device KIT 1 each by Does not apply route 3 (three) times  daily before meals. 1 each 0  . cycloSPORINE (RESTASIS) 0.05 % ophthalmic emulsion Place 1 drop into both eyes 2 (two) times daily. For PASS 3 each 4  . cycloSPORINE (RESTASIS) 0.05 % ophthalmic emulsion Place 1 drop into both eyes 2 (two) times daily. 0.4 mL 11  . gabapentin (NEURONTIN) 300 MG capsule Take 1 capsule (300 mg total) by mouth at bedtime. 90 capsule 2  . glucose blood (TRUETEST TEST) test strip 1 each by Other route 3 (three) times daily. Use as instructed 100 each 12  . levothyroxine (SYNTHROID, LEVOTHROID) 50 MCG tablet TAKE 1 TABLET BY MOUTH DAILY. 30 tablet 2  . metFORMIN (GLUCOPHAGE-XR) 500 MG 24 hr tablet Take 2 tablets (1,000 mg total) by mouth daily with breakfast. 180 tablet 2  . metoprolol succinate (TOPROL-XL) 25 MG 24 hr tablet Take 1 tablet (25 mg total) by mouth daily. 90 tablet 2  . sertraline (ZOLOFT) 50 MG tablet TAKE 1 TABLET BY MOUTH DAILY. 30 tablet 2  . TRUEPLUS LANCETS 28G MISC 1 each by Does not apply route 3 (three) times daily. 100 each 12   No current facility-administered medications on file prior to visit.     Allergies  Allergen Reactions  . Zithromax [Azithromycin Dihydrate]     Social History   Social History  . Marital status: Married    Spouse name: N/A  . Number of children: N/A  . Years  of education: N/A   Occupational History  . Not on file.   Social History Main Topics  . Smoking status: Never Smoker  . Smokeless tobacco: Never Used  . Alcohol use No  . Drug use: No  . Sexual activity: Yes    Birth control/ protection: Surgical   Other Topics Concern  . Not on file   Social History Narrative   Lives with husband who has diabetes.     Family History  Problem Relation Age of Onset  . Heart disease Father   . Hypertension Mother   . Cancer Mother        colon/cervical    Past Surgical History:  Procedure Laterality Date  . CESAREAN SECTION    . MYOMECTOMY    . TUBAL LIGATION      ROS: Review of  Systems Negative except as stated above PHYSICAL EXAM: BP 124/83 (BP Location: Left Arm, Patient Position: Sitting, Cuff Size: Large)   Pulse 66   Temp 98.3 F (36.8 C) (Oral)   Resp 18   Ht _0  (1.676 m)   Wt 204 lb 3.2 oz (92.6 kg)   SpO2 96%   BMI 32.96 kg/m   Wt Readings from Last 3 Encounters:  02/23/17 204 lb 3.2 oz (92.6 kg)  11/23/16 211 lb 9.6 oz (96 kg)  08/21/16 209 lb (94.8 kg)    Physical Exam  General appearance - alert, well appearing, middle-age African-American female and in no distress Mental status - alert, oriented to person, place, and time, normal mood, behavior, speech, dress, motor activity, and thought processes Neck - supple, no significant adenopathy Chest - clear to auscultation, no wheezes, rales or rhonchi, symmetric air entry Heart - normal rate, regular rhythm, normal S1, S2, no murmurs, rubs, clicks or gallops Extremities - peripheral pulses normal, no pedal edema, no clubbing or cyanosis   Depression screen [facility] 2/9 02/23/2017 11/23/2016 08/21/2016 12/19/2015 04/12/2015  Decreased Interest 0 1 0 0 1  Down, Depressed, Hopeless 0 0 0 0 1  PHQ - 2 Score 0 1 0 0 2  Altered sleeping 3 1 0 - 1  Tired, decreased energy _1 - 1  Change in appetite _2 - 2  Feeling bad or failure about yourself  0 0 0 - 1  Trouble concentrating 0 0 0 - 1  Moving slowly or fidgety/restless 0 0 0 - 1  Suicidal thoughts 0 0 0 - 0  PHQ-9 Score _3 - 9   Results for orders placed or performed in visit on 02/23/17  POCT glucose (manual entry)  Result Value Ref Range   POC Glucose 96 70 - 99 mg/dl   Lab Results  Component Value Date   HGBA1C 6.3 11/23/2016     ASSESSMENT AND PLAN: 1. Type 2 diabetes mellitus without complication, without long-term current use of insulin (HCC) Discussed the importance of healthy eating habits, regular aerobic exercise (at least 150 minutes a week as tolerated) and medication compliance to achieve or maintain control of  diabetes. -cont Metformin - POCT glucose (manual entry) - Hemoglobin A1c - metFORMIN (GLUCOPHAGE-XR) 500 MG 24 hr tablet; Take 2 tablets (1,000 mg total) by mouth daily with breakfast.  Dispense: 180 tablet; Refill: 2 - atorvastatin (LIPITOR) 20 MG tablet; Take 1 tablet (20 mg total) by mouth daily.  Dispense: 90 tablet; Refill: 2  2. Essential hypertension At goal. She is agreeable to stopping the metoprolol and changing to lisinopril -Advised patient  to check blood pressure daily over the next 2 weeks. Goal is to keep blood pressure 140/90 or lower - lisinopril (PRINIVIL,ZESTRIL) 5 MG tablet; Take 1 tablet (5 mg total) by mouth daily.  Dispense: 90 tablet; Refill: 3  3. Hyperlipidemia, unspecified hyperlipidemia type Continue Lipitor.  4. Hot flashes Check TSH to make sure thyroid level is still within targeted range. Estill Bamberg is okay we discussed increasing Zoloft from 50 mg daily to 75 mg daily. I will let her know once I get the results of the TSH - TSH  5. Colon cancer screening - Fecal occult blood, imunochemical  6. Influenza vaccination declined -I found out that she would be charged for the flu vaccine is given today. Patient declined.  7. Hypothyroidism, unspecified type - TSH - levothyroxine (SYNTHROID, LEVOTHROID) 50 MCG tablet; Take 1 tablet (50 mcg total) by mouth daily.  Dispense: 30 tablet; Refill: 5  8. Depression, unspecified depression type - sertraline (ZOLOFT) 50 MG tablet; Take 1 tablet (50 mg total) by mouth daily.  Dispense: 30 tablet; Refill: 5  9. Chronic low back pain, unspecified back pain laterality, with sciatica presence unspecified - gabapentin (NEURONTIN) 300 MG capsule; Take 1 capsule (300 mg total) by mouth at bedtime.  Dispense: 90 capsule; Refill: 2  Patient with limited finances. She requested that appointments be spread out beyound 4 mths.  F/u in 5 mths.  Patient was given the opportunity to ask questions.  Patient verbalized understanding  of the plan and was able to repeat key elements of the plan.   Orders Placed This Encounter  Procedures  . POCT glucose (manual entry)     Requested Prescriptions    No prescriptions requested or ordered in this encounter    No Follow-up on file.  Karle Plumber, MD, FACP

## 2017-02-23 NOTE — Patient Instructions (Signed)
Blood pressure:  Stop Metoprolol.  Start Lisinopril 5 mg daily instead.  Check blood pressure daily for next two weeks.  Goal is blood pressure of 140/90 or lower.   We will check your thyroid level. If it is okay, then we will increase Zoloft from 50 mg to 75 mg to help with hot flashes.    Follow a Healthy Eating Plan - You can do it! Limit sugary drinks.  Avoid sodas, sweet tea, sport or energy drinks, or fruit drinks.  Drink water, lo-fat milk, or diet drinks. Limit snack foods.   Cut back on candy, cake, cookies, chips, ice cream.  These are a special treat, only in small amounts. Eat plenty of vegetables.  Especially dark green, red, and orange vegetables. Aim for at least 3 servings a day. More is better! Include fruit in your daily diet.  Whole fruit is much healthier than fruit juice! Limit "white" bread, "white" pasta, "white" rice.   Choose "100% whole grain" products, brown or wild rice. Avoid fatty meats. Try "Meatless Monday" and choose eggs or beans one day a week.  When eating meat, choose lean meats like chicken, [place], and fish.  Grill, broil, or bake meats instead of frying, and eat poultry without the skin. Eat less salt.  Avoid frozen pizzas, frozen dinners and salty foods.  Use seasonings other than salt in cooking.  This can help blood pressure and keep you from swelling Beer, wine and liquor have calories.  If you can safely drink alcohol, limit to 1 drink per day for women, 2 drinks for men

## 2017-02-24 ENCOUNTER — Other Ambulatory Visit: Payer: Self-pay | Admitting: Internal Medicine

## 2017-02-24 DIAGNOSIS — F32A Depression, unspecified: Secondary | ICD-10-CM

## 2017-02-24 DIAGNOSIS — F329 Major depressive disorder, single episode, unspecified: Secondary | ICD-10-CM

## 2017-02-24 LAB — HEMOGLOBIN A1C
Est. average glucose Bld gHb Est-mCnc: 131 mg/dL
HEMOGLOBIN A1C: 6.2 % — AB (ref 4.8–5.6)

## 2017-02-24 LAB — TSH: TSH: 4.44 u[IU]/mL (ref 0.450–4.500)

## 2017-02-24 MED ORDER — SERTRALINE HCL 50 MG PO TABS: 75.0000 mg | ORAL_TABLET | Freq: Every day | ORAL | 5 refills | Status: DC

## 2017-03-03 ENCOUNTER — Telehealth: Payer: Self-pay

## 2017-03-03 NOTE — Telephone Encounter (Signed)
Pt contacted the office and stated the new bp medicine is making her bp drop very low and she is having a lot of headaches. I informed pt to stop taking the medication. Pt is requesting her old bp medication. Patient would like rx sent to our pharmacy. Please f/u

## 2017-03-04 NOTE — Telephone Encounter (Signed)
Spoke to patient:  Pt states she started medication on 9/14. She states she tried to take it consistently to build up a tolerance but would experience headache and chest discomfort.  Last time she took medication was yesterday morning. And her blood pressure  was 118/78. The two blood pressure reading she provided while on medication was: 99/69  99/74     She took her blood pressure while on the phone with writer: BP:114/78  P: 90

## 2017-03-05 MED ORDER — METOPROLOL SUCCINATE ER 25 MG PO TB24: 25.0000 mg | ORAL_TABLET | Freq: Every day | ORAL | 3 refills | Status: DC

## 2017-03-08 MED ORDER — METOPROLOL SUCCINATE ER 25 MG PO TB24: 25.0000 mg | ORAL_TABLET | Freq: Every day | ORAL | 3 refills | Status: DC

## 2017-03-08 MED FILL — METOPROLOL SUCC ER 25 MG TA: 25 | 30 days supply | Qty: 30 | Fill #0

## 2017-03-08 NOTE — Addendum Note (Signed)
Addended by: Carilyn Goodpasture on: 03/08/2017 09:57 AM   Modules accepted: Orders

## 2017-03-08 NOTE — Telephone Encounter (Signed)
Pt aware of medication change, prefer that medication is sent to [facility] pharmacy: Metoprolol.

## 2017-03-26 MED FILL — METFORMIN HCL ER 500 MG TAB: 500 | 30 days supply | Qty: 60 | Fill #1

## 2017-03-26 MED FILL — ?ATORVASTATIN 20 MG TABLET: 20 | 30 days supply | Qty: 30 | Fill #1

## 2017-03-26 MED FILL — SERTRALINE HCL 50 MG TABLET: 50 | 30 days supply | Qty: 30 | Fill #1

## 2017-03-26 MED FILL — LEVOTHYROXINE 50 MCG TABLET: 50 | 30 days supply | Qty: 30 | Fill #1

## 2017-04-07 MED FILL — METOPROLOL SUCC ER 25 MG TA: 25 | 30 days supply | Qty: 30 | Fill #1

## 2017-04-23 ENCOUNTER — Encounter (HOSPITAL_COMMUNITY): Payer: Self-pay

## 2017-04-30 MED FILL — LEVOTHYROXINE 50 MCG TABLET: 50 | 30 days supply | Qty: 30 | Fill #2

## 2017-04-30 MED FILL — SERTRALINE HCL 50 MG TABLET: 50 | 30 days supply | Qty: 30 | Fill #2

## 2017-04-30 MED FILL — ?ATORVASTATIN 20 MG TABLET: 20 | 30 days supply | Qty: 30 | Fill #2

## 2017-04-30 MED FILL — METFORMIN HCL ER 500 MG TAB: 500 | 30 days supply | Qty: 60 | Fill #2

## 2017-05-11 MED FILL — METOPROLOL SUCC ER 25 MG TA: 25 | 30 days supply | Qty: 30 | Fill #2

## 2017-06-01 MED FILL — LEVOTHYROXINE 50 MCG TABLET: 50 | 30 days supply | Qty: 30 | Fill #3

## 2017-06-01 MED FILL — ?ATORVASTATIN 20MG TABLET: 20 | 30 days supply | Qty: 30 | Fill #3

## 2017-06-01 MED FILL — METFORMIN HCL ER 500 MG TAB: 500 | 30 days supply | Qty: 60 | Fill #3

## 2017-06-01 MED FILL — SERTRALINE HCL 50 MG TABLET: 50 | 30 days supply | Qty: 30 | Fill #3

## 2017-06-21 MED FILL — METOPROLOL SUCC ER 25 MG TA: 25 | 30 days supply | Qty: 30 | Fill #3

## 2017-06-29 ENCOUNTER — Encounter (HOSPITAL_COMMUNITY): Payer: Self-pay

## 2017-07-05 MED FILL — LEVOTHYROXINE 50 MCG TABLET: 50 | 30 days supply | Qty: 30 | Fill #4

## 2017-07-05 MED FILL — SERTRALINE HCL 50 MG TABLET: 50 | 30 days supply | Qty: 30 | Fill #4

## 2017-07-05 MED FILL — METFORMIN HCL ER 500 MG TAB: 500 | 30 days supply | Qty: 60 | Fill #4

## 2017-07-05 MED FILL — ?ATORVASTATIN 20MG TABLET: 20 | 30 days supply | Qty: 30 | Fill #4

## 2017-07-07 ENCOUNTER — Other Ambulatory Visit: Payer: Self-pay | Admitting: Obstetrics and Gynecology

## 2017-07-07 DIAGNOSIS — Z1231 Encounter for screening mammogram for malignant neoplasm of breast: Secondary | ICD-10-CM

## 2017-07-26 MED FILL — METOPROLOL SUCCINATE ER 25: 25 | 30 days supply | Qty: 30 | Fill #4

## 2017-07-27 ENCOUNTER — Encounter: Payer: Self-pay | Admitting: Internal Medicine

## 2017-07-27 ENCOUNTER — Ambulatory Visit: Payer: Self-pay | Attending: Internal Medicine | Admitting: Internal Medicine

## 2017-07-27 VITALS — BP 128/86 | HR 72 | Temp 98.3°F | Resp 16 | Wt 201.0 lb

## 2017-07-27 DIAGNOSIS — N951 Menopausal and female climacteric states: Secondary | ICD-10-CM | POA: Insufficient documentation

## 2017-07-27 DIAGNOSIS — H04129 Dry eye syndrome of unspecified lacrimal gland: Secondary | ICD-10-CM | POA: Insufficient documentation

## 2017-07-27 DIAGNOSIS — F32A Depression, unspecified: Secondary | ICD-10-CM

## 2017-07-27 DIAGNOSIS — I10 Essential (primary) hypertension: Secondary | ICD-10-CM | POA: Insufficient documentation

## 2017-07-27 DIAGNOSIS — Z86718 Personal history of other venous thrombosis and embolism: Secondary | ICD-10-CM | POA: Insufficient documentation

## 2017-07-27 DIAGNOSIS — F329 Major depressive disorder, single episode, unspecified: Secondary | ICD-10-CM | POA: Insufficient documentation

## 2017-07-27 DIAGNOSIS — Z7984 Long term (current) use of oral hypoglycemic drugs: Secondary | ICD-10-CM | POA: Insufficient documentation

## 2017-07-27 DIAGNOSIS — G8929 Other chronic pain: Secondary | ICD-10-CM | POA: Insufficient documentation

## 2017-07-27 DIAGNOSIS — Z79899 Other long term (current) drug therapy: Secondary | ICD-10-CM | POA: Insufficient documentation

## 2017-07-27 DIAGNOSIS — E785 Hyperlipidemia, unspecified: Secondary | ICD-10-CM | POA: Insufficient documentation

## 2017-07-27 DIAGNOSIS — Z881 Allergy status to other antibiotic agents status: Secondary | ICD-10-CM | POA: Insufficient documentation

## 2017-07-27 DIAGNOSIS — E119 Type 2 diabetes mellitus without complications: Secondary | ICD-10-CM | POA: Insufficient documentation

## 2017-07-27 DIAGNOSIS — M545 Low back pain: Secondary | ICD-10-CM | POA: Insufficient documentation

## 2017-07-27 DIAGNOSIS — E039 Hypothyroidism, unspecified: Secondary | ICD-10-CM | POA: Insufficient documentation

## 2017-07-27 DIAGNOSIS — R232 Flushing: Secondary | ICD-10-CM

## 2017-07-27 LAB — GLUCOSE, POCT (MANUAL RESULT ENTRY): POC Glucose: 78 mg/dl (ref 70–99)

## 2017-07-27 MED ORDER — LEVOTHYROXINE SODIUM 50 MCG PO TABS: 50.0000 ug | ORAL_TABLET | Freq: Every day | ORAL | 5 refills | Status: DC

## 2017-07-27 MED ORDER — METFORMIN HCL ER 500 MG PO TB24: 1000.0000 mg | ORAL_TABLET | Freq: Every day | ORAL | 2 refills | Status: DC

## 2017-07-27 MED ORDER — METOPROLOL SUCCINATE ER 25 MG PO TB24: 25.0000 mg | ORAL_TABLET | Freq: Every day | ORAL | 3 refills | Status: DC

## 2017-07-27 MED ORDER — GABAPENTIN 300 MG PO CAPS: 300.0000 mg | ORAL_CAPSULE | Freq: Every day | ORAL | 2 refills | Status: DC

## 2017-07-27 MED ORDER — SERTRALINE HCL 50 MG PO TABS: 75.0000 mg | ORAL_TABLET | Freq: Every day | ORAL | 5 refills | Status: DC

## 2017-07-27 MED ORDER — ATORVASTATIN CALCIUM 20 MG PO TABS: 20.0000 mg | ORAL_TABLET | Freq: Every day | ORAL | 2 refills | Status: DC

## 2017-07-27 NOTE — Patient Instructions (Signed)
Check blood sugars several times a week.  Let us know if blood sugars run low.

## 2017-07-27 NOTE — Progress Notes (Signed)
Subjective:  Patient ID: Heather Bailey, female    DOB: 1960/11/28  Age: 57 y.o. MRN: [id number]  CC: Diabetes and Hypertension   HPI Meriah Shands is a 57 y/o female with medical hx of HTN, HLD, type 2 DM, Hypothyroidism, Depression, Dry eye, and chronic back pain. She presents today for f/u on chronic conditions and hot flashes.  Vasomotor Instability: Patient reports continued uncontrolled hot flashes. Denies palpations, dizziness, or near syncope. She was recently seen 02/23/17 for the same. Instructed to increase her Zoloft to 75 mg per day. She has not increased her amount.   HTN: Patient compliant with medication regimen and low salt diet. Reports daily physical activity with walking and taking the stairs while at work. Denies chest pain, sob, dyspnea on exertion, peripheral edema, or recurrent headaches. Reports changes in vision with increased blurred and spotty vision; with corrective lenses. Last eye exam was last year with no reports abnormalities, per patient.   DM: Patient compliant with medication regimen. She does not check her glucose level regularly. Denies symptoms of hypoglycemia or hyperglycemia. Reports intermittently skipping meals when she is too busy at work or tired upon arrival home. Denies paresthesia or reduced sensation in extremities. She is not followed by podiatry.   Hypothyroidism: Patient compliant with medication regimen. Denies generalized fatigue or weakness. Denies change in skin or hair characteristics. Last TSH was 4.44 on 02/23/17.   Depression: Patient compliant with medication regimen. She reports having depression due to family stressor with her son. She has noted improvement since she started taking Zoloft. Denies lack of energy, loss of interest, and seclusion from family/friends. Denies SI or HI.   Past Medical History:  Diagnosis Date  . Anemia Dx 2003  . Arthritis Dx 1999  . DVT (deep venous thrombosis) ([place])   . Fibroids   . Gestational  diabetes   . Hyperlipidemia Dx 2000  . Hypertension Dx 2000  . Thyroid disease Dx 2005   Past Surgical History:  Procedure Laterality Date  . CESAREAN SECTION    . MYOMECTOMY    . TUBAL LIGATION     Patient Active Problem List   Diagnosis Date Noted  . Hot flashes 02/23/2017  . Chronic low back pain 04/12/2015  . Dry skin 04/12/2015  . Chronic dryness of both eyes 08/28/2014  . DM2 (diabetes mellitus, type 2) ([place]) 03/19/2014  . Hypothyroidism 03/19/2014  . Hypertension 03/19/2014  . Hyperlipidemia 03/19/2014  . Depression 03/19/2014   Social History   Socioeconomic History  . Marital status: Married    Spouse name: Not on file  . Number of children: Not on file  . Years of education: Not on file  . Highest education level: Not on file  Social Needs  . Financial resource strain: Not on file  . Food insecurity - worry: Not on file  . Food insecurity - inability: Not on file  . Transportation needs - medical: Not on file  . Transportation needs - non-medical: Not on file  Occupational History  . Not on file  Tobacco Use  . Smoking status: Never Smoker  . Smokeless tobacco: Never Used  Substance and Sexual Activity  . Alcohol use: No  . Drug use: No  . Sexual activity: Yes    Birth control/protection: Surgical  Other Topics Concern  . Not on file  Social History Narrative   Lives with husband who has diabetes.    Outpatient Medications Prior to Visit  Medication Sig Dispense Refill  .  Blood Glucose Monitoring Suppl (TRUERESULT BLOOD GLUCOSE) w/Device KIT 1 each by Does not apply route 3 (three) times daily before meals. 1 each 0  . cycloSPORINE (RESTASIS) 0.05 % ophthalmic emulsion Place 1 drop into both eyes 2 (two) times daily. For PASS 3 each 4  . cycloSPORINE (RESTASIS) 0.05 % ophthalmic emulsion Place 1 drop into both eyes 2 (two) times daily. 0.4 mL 11  . glucose blood (TRUETEST TEST) test strip 1 each by Other route 3 (three) times daily. Use as instructed  100 each 12  . TRUEPLUS LANCETS 28G MISC 1 each by Does not apply route 3 (three) times daily. 100 each 12  . atorvastatin (LIPITOR) 20 MG tablet Take 1 tablet (20 mg total) by mouth daily. 90 tablet 2  . gabapentin (NEURONTIN) 300 MG capsule Take 1 capsule (300 mg total) by mouth at bedtime. 90 capsule 2  . levothyroxine (SYNTHROID, LEVOTHROID) 50 MCG tablet Take 1 tablet (50 mcg total) by mouth daily. 30 tablet 5  . metFORMIN (GLUCOPHAGE-XR) 500 MG 24 hr tablet Take 2 tablets (1,000 mg total) by mouth daily with breakfast. 180 tablet 2  . metoprolol succinate (TOPROL-XL) 25 MG 24 hr tablet Take 1 tablet (25 mg total) by mouth daily. 90 tablet 3  . sertraline (ZOLOFT) 50 MG tablet Take 1.5 tablets (75 mg total) by mouth daily. 45 tablet 5   No facility-administered medications prior to visit.    Allergies  Allergen Reactions  . Zithromax [Azithromycin Dihydrate]    ROS Review of Systems  Constitutional: Negative for activity change, appetite change, chills, fatigue, fever and unexpected weight change.  Eyes: Positive for visual disturbance.  Respiratory: Negative for cough, chest tightness and shortness of breath.   Cardiovascular: Negative for chest pain, palpitations and leg swelling.  Gastrointestinal: Negative for abdominal pain, constipation, diarrhea, nausea and vomiting.  Endocrine: Positive for heat intolerance (consistent with hot flashes). Negative for cold intolerance, polydipsia, polyphagia and polyuria.  Genitourinary: Negative for difficulty urinating, dysuria, frequency and urgency.  Musculoskeletal: Positive for back pain (chronic back pain, controlled with Gabapentin).  Skin: Negative for color change, rash and wound.  Neurological: Negative for dizziness, tremors, syncope, weakness, light-headedness, numbness and headaches.  Psychiatric/Behavioral: Negative for behavioral problems, decreased concentration, dysphoric mood, sleep disturbance and suicidal ideas.    Objective:  BP 128/86   Pulse 72   Temp 98.3 F (36.8 C) (Oral)   Resp 16   Wt 201 lb (91.2 kg)   SpO2 97%   BMI 32.44 kg/m   BP/Weight 07/27/2017 02/23/2017 3/64/6803  Systolic BP 212 248 250  Diastolic BP 86 83 80  Wt. (Lbs) 201 204.2 211.6  BMI 32.44 32.96 33.14   Lab Results  Component Value Date   HGBA1C 6.2 (H) 02/23/2017   Lab Results  Component Value Date   POCGLU 78 07/27/2017   POCGLU 96 02/23/2017   POCGLU 89 11/23/2016   Physical Exam  Constitutional: She is oriented to person, place, and time. She appears well-developed and well-nourished. No distress.  HENT:  Head: Normocephalic and atraumatic.  Eyes: Conjunctivae and EOM are normal.  Neck: Normal range of motion. Carotid bruit is not present. No thyromegaly present.  Cardiovascular: Normal heart sounds. Exam reveals no gallop and no friction rub.  No murmur heard. Pulses:      Radial pulses are 2+ on the right side, and 2+ on the left side.       Dorsalis pedis pulses are 2+ on the right side, and  2+ on the left side.  Negative for peripheral edema  Pulmonary/Chest: Effort normal and breath sounds normal. No respiratory distress. She exhibits no tenderness.  Abdominal: Soft. Bowel sounds are normal. She exhibits no distension. There is no tenderness.  Musculoskeletal: Normal range of motion. She exhibits no tenderness.  Neurological: She is alert and oriented to person, place, and time. She has normal strength. No sensory deficit.  Skin: Skin is warm and dry. No rash noted. No erythema. No pallor.  Psychiatric: She has a normal mood and affect. Her behavior is normal. Judgment and thought content normal.  Nursing note and vitals reviewed.  Assessment & Plan:   1. Type 2 diabetes mellitus without complication, without long-term current use of insulin ([place]), uncontrolled - Patient is 78 mg/dL in clinic today; asymptomatic. She was provided juice and glucose rechecked prior to leaving clinic.  -  Instructed to maintain a consistent eating regimen (advised to not skip meals) to keep her glucose level steady.  - Instructed to keep a daily log of glucose levels at home at least BID checks.  - Instructed to maintain a low carb, low sugar, low salt diet.  - Instructed to maintain 150 min/week exercise regimen.  - F/u in 5 months.  - POCT glucose (manual entry) - Microalbumin / creatinine urine ratio - Hemoglobin A1c - atorvastatin (LIPITOR) 20 MG tablet; Take 1 tablet (20 mg total) by mouth daily.  Dispense: 90 tablet; Refill: 2 - metFORMIN (GLUCOPHAGE-XR) 500 MG 24 hr tablet; Take 2 tablets (1,000 mg total) by mouth daily with breakfast.  Dispense: 180 tablet; Refill: 2  2. Essential hypertension, controlled - In clinic BP 128/86. Patient is well controlled.  - Continue medication regimen.  - Instructed to maintain a low carb, low sugar, low salt diet.  - Instructed to maintain 150 min/week exercise regimen.  - F/u in 5 months.  - metoprolol succinate (TOPROL-XL) 25 MG 24 hr tablet; Take 1 tablet (25 mg total) by mouth daily.  Dispense: 90 tablet; Refill: 3  3. Hypothyroidism, unspecified type, controlled - Last TSH 4.44. Patient is well controlled.  - Continue medication regimen. - F/u in 5 months.  - levothyroxine (SYNTHROID, LEVOTHROID) 50 MCG tablet; Take 1 tablet (50 mcg total) by mouth daily.  Dispense: 90 tablet; Refill: 5  4. Depression, unspecified depression type, controlled - Continue medication regimen.  - sertraline (ZOLOFT) 50 MG tablet; Take 1.5 tablets (75 mg total) by mouth daily.  Dispense: 135 tablet; Refill: 5  5. Hot flashes, uncontrolled - Patient is symptomatic. Instructed to increase her Zoloft to 75 mg daily (1.5 tablet/day). - F/u if symptoms do not improve.  - sertraline (ZOLOFT) 50 MG tablet; Take 1.5 tablets (75 mg total) by mouth daily.  Dispense: 135 tablet; Refill: 5  6. Chronic low back pain, unspecified back pain laterality, with sciatica  presence unspecified, controlled - gabapentin (NEURONTIN) 300 MG capsule; Take 1 capsule (300 mg total) by mouth at bedtime.  Dispense: 90 capsule; Refill: 2  Meds ordered this encounter  Medications  . sertraline (ZOLOFT) 50 MG tablet    Sig: Take 1.5 tablets (75 mg total) by mouth daily.    Dispense:  135 tablet    Refill:  5  . levothyroxine (SYNTHROID, LEVOTHROID) 50 MCG tablet    Sig: Take 1 tablet (50 mcg total) by mouth daily.    Dispense:  90 tablet    Refill:  5  . gabapentin (NEURONTIN) 300 MG capsule    Sig: Take  1 capsule (300 mg total) by mouth at bedtime.    Dispense:  90 capsule    Refill:  2  . metoprolol succinate (TOPROL-XL) 25 MG 24 hr tablet    Sig: Take 1 tablet (25 mg total) by mouth daily.    Dispense:  90 tablet    Refill:  3  . atorvastatin (LIPITOR) 20 MG tablet    Sig: Take 1 tablet (20 mg total) by mouth daily.    Dispense:  90 tablet    Refill:  2  . metFORMIN (GLUCOPHAGE-XR) 500 MG 24 hr tablet    Sig: Take 2 tablets (1,000 mg total) by mouth daily with breakfast.    Dispense:  180 tablet    Refill:  2    Follow-up: Return in about 5 months (around 12/24/2017).   Coreon Simkins H. Hulan Fray, AGDNP-student

## 2017-07-28 LAB — MICROALBUMIN / CREATININE URINE RATIO
CREATININE, UR: 238.3 mg/dL
MICROALB/CREAT RATIO: 12.6 mg/g{creat} (ref 0.0–30.0)
MICROALBUM., U, RANDOM: 30.1 ug/mL

## 2017-07-28 LAB — HEMOGLOBIN A1C
ESTIMATED AVERAGE GLUCOSE: 126 mg/dL
HEMOGLOBIN A1C: 6 % — AB (ref 4.8–5.6)

## 2017-07-28 MED FILL — SERTRALINE HCL 50 MG TABLET: 50 | 30 days supply | Qty: 45 | Fill #0

## 2017-08-02 MED FILL — ATORVASTATIN 20 MG TABLET: 20 | 30 days supply | Qty: 30 | Fill #5

## 2017-08-02 MED FILL — METFORMIN HCL ER 500 MG TAB: 500 | 30 days supply | Qty: 60 | Fill #5

## 2017-08-02 MED FILL — LEVOTHYROXINE 50 MCG TABLET: 50 | 30 days supply | Qty: 30 | Fill #5

## 2017-08-03 ENCOUNTER — Ambulatory Visit (HOSPITAL_COMMUNITY)
Admission: RE | Admit: 2017-08-03 | Discharge: 2017-08-03 | Disposition: A | Discharge status: Home / Self Care | Payer: Self-pay | Source: Ambulatory Visit | Attending: Obstetrics and Gynecology | Admitting: Obstetrics and Gynecology

## 2017-08-03 ENCOUNTER — Encounter (HOSPITAL_COMMUNITY): Payer: Self-pay

## 2017-08-03 ENCOUNTER — Ambulatory Visit
Admission: RE | Admit: 2017-08-03 | Discharge: 2017-08-03 | Disposition: A | Discharge status: Home / Self Care | Payer: No Typology Code available for payment source | Source: Ambulatory Visit | Attending: Obstetrics and Gynecology | Admitting: Obstetrics and Gynecology

## 2017-08-03 VITALS — BP 122/80 | Ht 66.0 in | Wt 197.0 lb

## 2017-08-03 DIAGNOSIS — Z1239 Encounter for other screening for malignant neoplasm of breast: Secondary | ICD-10-CM

## 2017-08-03 DIAGNOSIS — Z1231 Encounter for screening mammogram for malignant neoplasm of breast: Secondary | ICD-10-CM

## 2017-08-03 NOTE — Patient Instructions (Addendum)
Explained breast self awareness to Heather Bailey. Patient did not need a Pap smear today due to last Pap smear and HPV typing was 02/13/2014. Let her know [facility] will cover Pap smears and HPV typing every 5 years unless has a history of abnormal Pap smears. Referred patient to the [facility] for a screening mammogram. Appointment scheduled for Tuesday, August 03, 2017 at 1430. Patient aware of appointment and will be there. Let patient know the [facility] will follow up with her within the next couple weeks with results of mammogram by letter or phone. Heather Bailey verbalized understanding.  Jenniger Figiel, Arvil Chaco, RN 2:52 PM

## 2017-08-03 NOTE — Progress Notes (Signed)
No complaints today.   Pap Smear: Pap smear not completed today. Last Pap smear was 02/13/2014 at [facility] and normal with negative HPV. Patients prior Pap smears on 11/19/2012 and 08/18/2011 at [facility] were normal. Per patient had an abnormal Pap smear in 2012 that required a colposcopy for follow up 10/06/2010. Per patient has a history of around 3 other abnormal Pap smears prior to the abnormal Pap smear in 2012. Patient stated she has a history of cryo and coloposcopy prior to last abnormal Pap smear. Last Pap smear result is in EPIC.   Physical exam: Breasts Breasts symmetrical. No skin abnormalities bilateral breasts. No nipple retraction bilateral breasts. No nipple discharge bilateral breasts. No lymphadenopathy. No lumps palpated bilateral breasts. No complaints of pain or tenderness on exam. Referred patient to the [facility] for a screening mammogram. Appointment scheduled for Tuesday, August 03, 2017 at 1430.        Pelvic/Bimanual No Pap smear completed today since last Pap smear and HPV typing was 02/13/2014. Pap smear not indicated per [facility] guidelines.   Smoking History: Patient has never smoked.  Patient Navigation: Patient education provided. Access to services provided for patient through [place] program.   Colorectal Cancer Screening: Per patient has never had a colonoscopy completed. No complaints today. FIT Test given to patient to complete and return to [facility].  Breast and Cervical Cancer Risk Assessment: Patient has no family history of breast cancer, known genetic mutations, or radiation treatment to the chest before age 51. Patient has a history of cervical dysplasia. Patient has no history of being immunocompromised, or DES exposure in-utero.

## 2017-08-23 MED FILL — METOPROLOL SUCCINATE ER 25: 25 | 30 days supply | Qty: 30 | Fill #5

## 2017-09-06 MED FILL — ATORVASTATIN 20 MG TABLET: 20 | 30 days supply | Qty: 30 | Fill #6

## 2017-09-06 MED FILL — LEVOTHYROXINE 50 MCG TABLET: 50 | 30 days supply | Qty: 30 | Fill #0

## 2017-09-06 MED FILL — SERTRALINE HCL 50 MG TABLET: 50 | 30 days supply | Qty: 45 | Fill #1

## 2017-09-06 MED FILL — METFORMIN HCL ER 500 MG TAB: 500 | 30 days supply | Qty: 60 | Fill #6

## 2017-10-05 MED FILL — METOPROLOL SUCCINATE ER 25: 25 | 30 days supply | Qty: 30 | Fill #6

## 2017-10-05 MED FILL — METFORMIN HCL ER 500 MG TAB: 500 | 30 days supply | Qty: 60 | Fill #7

## 2017-10-05 MED FILL — LEVOTHYROXINE 50 MCG TABLET: 50 | 30 days supply | Qty: 30 | Fill #1

## 2017-10-05 MED FILL — ATORVASTATIN 20 MG TABLET: 20 | 30 days supply | Qty: 30 | Fill #7

## 2017-10-05 MED FILL — SERTRALINE HCL 50 MG TABLET: 50 | 30 days supply | Qty: 45 | Fill #2

## 2017-11-10 MED FILL — ATORVASTATIN 20 MG TABLET: 20 | 30 days supply | Qty: 30 | Fill #8

## 2017-11-10 MED FILL — METOPROLOL SUCCINATE ER 25: 25 | 30 days supply | Qty: 30 | Fill #7

## 2017-11-10 MED FILL — SERTRALINE HCL 50 MG TABS: 50 | 30 days supply | Qty: 45 | Fill #3

## 2017-11-10 MED FILL — LEVOTHYROXINE 50 MCG TABLET: 50 | 30 days supply | Qty: 30 | Fill #2

## 2017-11-10 MED FILL — METFORMIN HCL ER 500 MG TAB: 500 | 30 days supply | Qty: 60 | Fill #8

## 2017-12-14 MED FILL — SERTRALINE HCL 50 MG TABS: 50 | 30 days supply | Qty: 45 | Fill #4

## 2017-12-14 MED FILL — METOPROLOL SUCCINATE ER 25: 25 | 30 days supply | Qty: 30 | Fill #8

## 2017-12-14 MED FILL — METFORMIN HCL ER 500 MG TAB: 500 | 30 days supply | Qty: 60 | Fill #0

## 2017-12-14 MED FILL — LEVOTHYROXINE 50 MCG TABLET: 50 | 30 days supply | Qty: 30 | Fill #3

## 2018-01-03 MED FILL — ATORVASTATIN 20 MG TABLET: 20 | 30 days supply | Qty: 30 | Fill #0

## 2018-01-06 ENCOUNTER — Ambulatory Visit: Payer: Self-pay | Attending: Family Medicine

## 2018-01-17 MED FILL — SERTRALINE HCL 50 MG TABS: 50 | 30 days supply | Qty: 45 | Fill #5

## 2018-01-17 MED FILL — LEVOTHYROXINE 50 MCG TABLET: 50 | 30 days supply | Qty: 30 | Fill #4

## 2018-01-17 MED FILL — METOPROLOL SUCCINATE ER 25: 25 | 30 days supply | Qty: 30 | Fill #9

## 2018-01-17 MED FILL — METFORMIN HCL ER 500 MG TAB: 500 | 30 days supply | Qty: 60 | Fill #1

## 2018-01-27 MED FILL — ?ATORVASTATIN 20 MG TABLET: 20 | 30 days supply | Qty: 30 | Fill #1

## 2018-02-15 MED FILL — SERTRALINE HCL 50 MG TABS: 50 | 30 days supply | Qty: 45 | Fill #6

## 2018-02-15 MED FILL — ?METFORMIN HCL ER 500 MG TA: 500 | 30 days supply | Qty: 60 | Fill #2

## 2018-02-15 MED FILL — LEVOTHYROXINE 50 MCG TABLET: 50 | 30 days supply | Qty: 30 | Fill #5

## 2018-02-15 MED FILL — METOPROLOL SUCCINATE ER 25: 25 | 30 days supply | Qty: 30 | Fill #10

## 2018-02-22 ENCOUNTER — Ambulatory Visit: Payer: Self-pay | Attending: Internal Medicine | Admitting: Internal Medicine

## 2018-02-22 ENCOUNTER — Encounter: Payer: Self-pay | Admitting: Internal Medicine

## 2018-02-22 VITALS — BP 136/90 | HR 62 | Temp 97.9°F | Resp 16 | Wt 200.0 lb

## 2018-02-22 DIAGNOSIS — I10 Essential (primary) hypertension: Secondary | ICD-10-CM

## 2018-02-22 DIAGNOSIS — E785 Hyperlipidemia, unspecified: Secondary | ICD-10-CM

## 2018-02-22 DIAGNOSIS — Z881 Allergy status to other antibiotic agents status: Secondary | ICD-10-CM | POA: Insufficient documentation

## 2018-02-22 DIAGNOSIS — Z23 Encounter for immunization: Secondary | ICD-10-CM

## 2018-02-22 DIAGNOSIS — E039 Hypothyroidism, unspecified: Secondary | ICD-10-CM

## 2018-02-22 DIAGNOSIS — E119 Type 2 diabetes mellitus without complications: Secondary | ICD-10-CM

## 2018-02-22 DIAGNOSIS — Z79899 Other long term (current) drug therapy: Secondary | ICD-10-CM | POA: Insufficient documentation

## 2018-02-22 DIAGNOSIS — Z7984 Long term (current) use of oral hypoglycemic drugs: Secondary | ICD-10-CM | POA: Insufficient documentation

## 2018-02-22 DIAGNOSIS — G8929 Other chronic pain: Secondary | ICD-10-CM | POA: Insufficient documentation

## 2018-02-22 DIAGNOSIS — F329 Major depressive disorder, single episode, unspecified: Secondary | ICD-10-CM | POA: Insufficient documentation

## 2018-02-22 DIAGNOSIS — M545 Low back pain: Secondary | ICD-10-CM | POA: Insufficient documentation

## 2018-02-22 LAB — GLUCOSE, POCT (MANUAL RESULT ENTRY)
POC GLUCOSE: 69 mg/dL — AB (ref 70–99)
POC Glucose: 116 mg/dl — AB (ref 70–99)
POC Glucose: 68 mg/dl — AB (ref 70–99)

## 2018-02-22 LAB — POCT GLYCOSYLATED HEMOGLOBIN (HGB A1C): HbA1c POC (<> result, manual entry): 6 % (ref 4.0–5.6)

## 2018-02-22 NOTE — Patient Instructions (Signed)

## 2018-02-22 NOTE — Progress Notes (Signed)
Patient ID: Heather Bailey, female    DOB: 1960/12/26  MRN: [id number]  CC: Diabetes   Subjective: Heather Bailey is a 57 y.o. female who presents for chronic disease management.  Her concerns today include:  Pt with hx of HTN, DM, hypothyroidism, HL, depression, chronic LBP (on Gabapentin), dry skin.  DM:  BS low.  She has not eaten since this morning. Can tell when BS low. Checks BS a few times a wk.  Does not recall the range Eating habits:  Eating too much starchy foods. Does a lot of walking at work.  Works throughout [employer]  HTN:  Compliant with Metoprolol and salt restriction.   No LE edema.  Denies any chest pain, shortness of breath, lower extremity edema, headache or dizziness. Checks BP 2 x a mth.  Reports good range.  HL:  Tolerating Lipitor.  Patient Active Problem List   Diagnosis Date Noted  . Hot flashes 02/23/2017  . Chronic low back pain 04/12/2015  . Dry skin 04/12/2015  . Chronic dryness of both eyes 08/28/2014  . DM2 (diabetes mellitus, type 2) ([place]) 03/19/2014  . Hypothyroidism 03/19/2014  . Hypertension 03/19/2014  . Hyperlipidemia 03/19/2014  . Depression 03/19/2014     Current Outpatient Medications on File Prior to Visit  Medication Sig Dispense Refill  . atorvastatin (LIPITOR) 20 MG tablet Take 1 tablet (20 mg total) by mouth daily. 90 tablet 2  . Blood Glucose Monitoring Suppl (TRUERESULT BLOOD GLUCOSE) w/Device KIT 1 each by Does not apply route 3 (three) times daily before meals. 1 each 0  . cycloSPORINE (RESTASIS) 0.05 % ophthalmic emulsion Place 1 drop into both eyes 2 (two) times daily. For PASS 3 each 4  . cycloSPORINE (RESTASIS) 0.05 % ophthalmic emulsion Place 1 drop into both eyes 2 (two) times daily. 0.4 mL 11  . gabapentin (NEURONTIN) 300 MG capsule Take 1 capsule (300 mg total) by mouth at bedtime. 90 capsule 2  . glucose blood (TRUETEST TEST) test strip 1 each by Other route 3 (three) times daily. Use as instructed 100 each 12  .  levothyroxine (SYNTHROID, LEVOTHROID) 50 MCG tablet Take 1 tablet (50 mcg total) by mouth daily. 90 tablet 5  . metFORMIN (GLUCOPHAGE-XR) 500 MG 24 hr tablet Take 2 tablets (1,000 mg total) by mouth daily with breakfast. 180 tablet 2  . metoprolol succinate (TOPROL-XL) 25 MG 24 hr tablet Take 1 tablet (25 mg total) by mouth daily. 90 tablet 3  . sertraline (ZOLOFT) 50 MG tablet Take 1.5 tablets (75 mg total) by mouth daily. 135 tablet 5  . TRUEPLUS LANCETS 28G MISC 1 each by Does not apply route 3 (three) times daily. 100 each 12   No current facility-administered medications on file prior to visit.     Allergies  Allergen Reactions  . Zithromax [Azithromycin Dihydrate]     Social History   Socioeconomic History  . Marital status: Married    Spouse name: Not on file  . Number of children: Not on file  . Years of education: Not on file  . Highest education level: Not on file  Occupational History  . Not on file  Social Needs  . Financial resource strain: Not on file  . Food insecurity:    Worry: Not on file    Inability: Not on file  . Transportation needs:    Medical: Not on file    Non-medical: Not on file  Tobacco Use  . Smoking status: Never Smoker  .  Smokeless tobacco: Never Used  Substance and Sexual Activity  . Alcohol use: No  . Drug use: No  . Sexual activity: Yes    Birth control/protection: Surgical  Lifestyle  . Physical activity:    Days per week: Patient refused    Minutes per session: Patient refused  . Stress: Not on file  Relationships  . Social connections:    Talks on phone: Patient refused    Gets together: Patient refused    Attends religious service: Patient refused    Active member of club or organization: Patient refused    Attends meetings of clubs or organizations: Patient refused    Relationship status: Patient refused  . Intimate partner violence:    Fear of current or ex partner: Patient refused    Emotionally abused: Patient refused      Physically abused: Patient refused    Forced sexual activity: Patient refused  Other Topics Concern  . Not on file  Social History Narrative   Lives with husband who has diabetes.     Family History  Problem Relation Age of Onset  . Heart disease Father   . Hypertension Mother   . Cancer Mother        colon/cervical    Past Surgical History:  Procedure Laterality Date  . CESAREAN SECTION    . MYOMECTOMY    . TUBAL LIGATION      ROS: Review of Systems Negative except as stated above  PHYSICAL EXAM: BP 136/90   Pulse 62   Temp 97.9 F (36.6 C) (Oral)   Resp 16   Wt 200 lb (90.7 kg)   SpO2 100%   BMI 32.28 kg/m   Physical Exam Repeat blood pressure 130/78. General appearance - alert, well appearing, and in no distress Mental status - normal mood, behavior, speech, dress, motor activity, and thought processes Neck - supple, no significant adenopathy Chest - clear to auscultation, no wheezes, rales or rhonchi, symmetric air entry Heart - normal rate, regular rhythm, normal S1, S2, no murmurs, rubs, clicks or gallops Extremities - peripheral pulses normal, no pedal edema, no clubbing or cyanosis Diabetic Foot Exam - Simple   Simple Foot Form Visual Inspection No deformities, no ulcerations, no other skin breakdown bilaterally:  Yes Sensation Testing Intact to touch and monofilament testing bilaterally:  Yes Pulse Check Posterior Tibialis and Dorsalis pulse intact bilaterally:  Yes Comments     Results for orders placed or performed in visit on 02/22/18  POCT glucose (manual entry)  Result Value Ref Range   POC Glucose 69 (A) 70 - 99 mg/dl  POCT glycosylated hemoglobin (Hb A1C)  Result Value Ref Range   Hemoglobin A1C     HbA1c POC (<> result, manual entry) 6.0 4.0 - 5.6 %   HbA1c, POC (prediabetic range)     HbA1c, POC (controlled diabetic range)    POCT glucose (manual entry)  Result Value Ref Range   POC Glucose 68 (A) 70 - 99 mg/dl  POCT glucose  (manual entry)  Result Value Ref Range   POC Glucose 116 (A) 70 - 99 mg/dl   a1C 6.0   ASSESSMENT AND PLAN: 1. Type 2 diabetes mellitus without complication, without long-term current use of insulin ([place]) Encourage patient to getting her 3 meals a day to avoid hypoglycemia.  She was given a glucose tube with subsequent increase in blood sugar to 116 before discharge.  Advised to purchase glucose tabs over-the-counter and keep with her in her purse.  Continue metformin She is overdue for eye exam.  She does not have insurance.  She will try to get [facility] done [facility]. - POCT glucose (manual entry) - POCT glycosylated hemoglobin (Hb A1C) - CBC - Comprehensive metabolic panel - Lipid panel - POCT glucose (manual entry) - POCT glucose (manual entry)  2. Essential hypertension At goal.  Continue metoprolol and DASH diet  3. Hypothyroidism, unspecified type - TSH  4. Hyperlipidemia, unspecified hyperlipidemia type Continue Lipitor.  5. Need for influenza vaccination - Flu Vaccine QUAD 6+ mos PF IM (Fluarix Quad PF)   Patient was given the opportunity to ask questions.  Patient verbalized understanding of the plan and was able to repeat key elements of the plan.   Orders Placed This Encounter  Procedures  . Flu Vaccine QUAD 6+ mos PF IM (Fluarix Quad PF)  . CBC  . Comprehensive metabolic panel  . Lipid panel  . TSH  . POCT glucose (manual entry)  . POCT glycosylated hemoglobin (Hb A1C)  . POCT glucose (manual entry)  . POCT glucose (manual entry)     Requested Prescriptions    No prescriptions requested or ordered in this encounter    Return in about 4 months (around 06/24/2018).  Karle Plumber, MD, FACP

## 2018-02-23 LAB — COMPREHENSIVE METABOLIC PANEL
A/G RATIO: 1.4 (ref 1.2–2.2)
ALBUMIN: 4.4 g/dL (ref 3.5–5.5)
ALT: 19 IU/L (ref 0–32)
AST: 16 IU/L (ref 0–40)
Alkaline Phosphatase: 89 IU/L (ref 39–117)
BUN/Creatinine Ratio: 14 (ref 9–23)
BUN: 17 mg/dL (ref 6–24)
Bilirubin Total: 0.2 mg/dL (ref 0.0–1.2)
CO2: 23 mmol/L (ref 20–29)
Calcium: 10.2 mg/dL (ref 8.7–10.2)
Chloride: 104 mmol/L (ref 96–106)
Creatinine, Ser: 1.21 mg/dL — ABNORMAL HIGH (ref 0.57–1.00)
GFR calc Af Amer: 57 mL/min/{1.73_m2} — ABNORMAL LOW (ref >59)
GFR, EST NON AFRICAN AMERICAN: 50 mL/min/{1.73_m2} — AB (ref >59)
Globulin, Total: 3.2 g/dL (ref 1.5–4.5)
Glucose: 81 mg/dL (ref 65–99)
Potassium: 4.1 mmol/L (ref 3.5–5.2)
Sodium: 143 mmol/L (ref 134–144)
Total Protein: 7.6 g/dL (ref 6.0–8.5)

## 2018-02-23 LAB — CBC
Hematocrit: 37.9 % (ref 34.0–46.6)
Hemoglobin: 12.2 g/dL (ref 11.1–15.9)
MCH: 28.8 pg (ref 26.6–33.0)
MCHC: 32.2 g/dL (ref 31.5–35.7)
MCV: 89 fL (ref 79–97)
PLATELETS: 289 10*3/uL (ref 150–450)
RBC: 4.24 x10E6/uL (ref 3.77–5.28)
RDW: 14.1 % (ref 12.3–15.4)
WBC: 5.7 10*3/uL (ref 3.4–10.8)

## 2018-02-23 LAB — LIPID PANEL
CHOL/HDL RATIO: 3.9 ratio (ref 0.0–4.4)
CHOLESTEROL TOTAL: 186 mg/dL (ref 100–199)
HDL: 48 mg/dL (ref >39)
LDL CALC: 118 mg/dL — AB (ref 0–99)
TRIGLYCERIDES: 100 mg/dL (ref 0–149)
VLDL Cholesterol Cal: 20 mg/dL (ref 5–40)

## 2018-02-23 LAB — TSH: TSH: 2.05 u[IU]/mL (ref 0.450–4.500)

## 2018-02-25 ENCOUNTER — Telehealth: Payer: Self-pay | Admitting: Internal Medicine

## 2018-02-25 DIAGNOSIS — E119 Type 2 diabetes mellitus without complications: Secondary | ICD-10-CM

## 2018-02-25 MED ORDER — ATORVASTATIN CALCIUM 40 MG PO TABS: 40.0000 mg | ORAL_TABLET | Freq: Every day | ORAL | 5 refills | Status: DC

## 2018-02-25 NOTE — Telephone Encounter (Signed)
Will forward to pcp

## 2018-02-25 NOTE — Telephone Encounter (Signed)
Patient called stating that she is taking her Lipitor consistently. Please follow up.

## 2018-02-28 MED FILL — ?ATORVASTATIN 40MG TABLET: 40 | 30 days supply | Qty: 30 | Fill #0

## 2018-02-28 NOTE — Telephone Encounter (Signed)
Contacted pt to go over Dr. Wynetta Emery response pt didn't answer left a detailed vm informing pt of this information and if she has any questions or concerns she can give me a call

## 2018-03-02 ENCOUNTER — Telehealth: Payer: Self-pay | Admitting: Internal Medicine

## 2018-03-02 NOTE — Telephone Encounter (Signed)
Pt called and informed of the new prescription sent to [place] patient understood has no further questions or concerns

## 2018-03-02 NOTE — Telephone Encounter (Signed)
Patient called today to see if she could speak with you. Patient was informed that a VM was left however, she says she didn't get anything. Please follow up with patient.

## 2018-03-18 MED FILL — METOPROLOL SUCCINATE ER 25: 25 | 30 days supply | Qty: 30 | Fill #0

## 2018-03-18 MED FILL — ?LEVOTHYROXINE 50 MCG TABLE: 50 | 30 days supply | Qty: 30 | Fill #6

## 2018-03-18 MED FILL — SERTRALINE HCL 50 MG TABS: 50 | 30 days supply | Qty: 45 | Fill #7

## 2018-03-18 MED FILL — METFORMIN HCL ER 500 MG TAB: 500 | 30 days supply | Qty: 60 | Fill #3

## 2018-03-23 MED FILL — ?ATORVASTATIN 20 MG TABLET: 20 | 30 days supply | Qty: 30 | Fill #2

## 2018-04-19 MED FILL — SERTRALINE HCL 50 MG TABS: 50 | 30 days supply | Qty: 45 | Fill #8

## 2018-04-19 MED FILL — ?ATORVASTATIN 20 MG TABLET: 20 | 30 days supply | Qty: 30 | Fill #3

## 2018-04-19 MED FILL — METFORMIN HCL ER 500 MG TAB: 500 | 30 days supply | Qty: 60 | Fill #4

## 2018-04-19 MED FILL — ?LEVOTHYROXINE 50 MCG TABLE: 50 | 30 days supply | Qty: 30 | Fill #7

## 2018-04-19 MED FILL — METOPROLOL SUCCINATE ER 25: 25 | 30 days supply | Qty: 30 | Fill #1

## 2018-05-18 MED FILL — ?LEVOTHYROXINE 50 MCG TABLE: 50 | 30 days supply | Qty: 30 | Fill #8

## 2018-05-18 MED FILL — ?ATORVASTATIN 20 MG TABLET: 20 | 30 days supply | Qty: 30 | Fill #4

## 2018-05-18 MED FILL — SERTRALINE HCL 50 MG TABS: 50 | 30 days supply | Qty: 45 | Fill #9

## 2018-05-18 MED FILL — METOPROLOL SUCCINATE ER 25: 25 | 30 days supply | Qty: 30 | Fill #2

## 2018-05-18 MED FILL — METFORMIN HCL ER 500 MG TAB: 500 | 30 days supply | Qty: 60 | Fill #5

## 2018-06-21 MED FILL — SERTRALINE HCL 50 MG TABS: 50 | 30 days supply | Qty: 45 | Fill #10

## 2018-06-21 MED FILL — METFORMIN HCL ER 500 MG TAB: 500 | 30 days supply | Qty: 60 | Fill #6

## 2018-06-21 MED FILL — METOPROLOL SUCCINATE ER 25: 25 | 30 days supply | Qty: 30 | Fill #3

## 2018-06-21 MED FILL — ?LEVOTHYROXINE 50 MCG TABLE: 50 | 30 days supply | Qty: 30 | Fill #9

## 2018-06-24 ENCOUNTER — Ambulatory Visit: Payer: Self-pay | Admitting: Internal Medicine

## 2018-06-27 ENCOUNTER — Other Ambulatory Visit: Payer: Self-pay

## 2018-07-07 LAB — FECAL OCCULT BLOOD, IMMUNOCHEMICAL: FECAL OCCULT BLD: NEGATIVE

## 2018-07-12 MED FILL — ?ATORVASTATIN 20 MG TABLET: 20 | 30 days supply | Qty: 30 | Fill #5

## 2018-07-13 ENCOUNTER — Encounter (HOSPITAL_COMMUNITY): Payer: Self-pay

## 2018-07-22 MED FILL — ?METOPROLOL SUCC ER 25MG TA: 25 | 30 days supply | Qty: 30 | Fill #4

## 2018-07-22 MED FILL — ?LEVOTHYROXINE 50 MCG TABLE: 50 | 30 days supply | Qty: 30 | Fill #10

## 2018-07-22 MED FILL — SERTRALINE HCL 50 MG TABS: 50 | 30 days supply | Qty: 45 | Fill #11

## 2018-07-22 MED FILL — ?METFORMIN HCL ER 500MG TAB: 500 | 30 days supply | Qty: 60 | Fill #7

## 2018-07-29 ENCOUNTER — Ambulatory Visit: Payer: Self-pay | Admitting: Internal Medicine

## 2018-08-02 ENCOUNTER — Ambulatory Visit: Payer: Self-pay | Admitting: Critical Care Medicine

## 2018-08-15 ENCOUNTER — Encounter: Payer: Self-pay | Admitting: Internal Medicine

## 2018-08-15 ENCOUNTER — Ambulatory Visit: Payer: Self-pay | Attending: Internal Medicine | Admitting: Internal Medicine

## 2018-08-15 ENCOUNTER — Other Ambulatory Visit: Payer: Self-pay | Admitting: Internal Medicine

## 2018-08-15 VITALS — BP 118/82 | HR 78 | Temp 97.9°F | Resp 16 | Wt 202.0 lb

## 2018-08-15 DIAGNOSIS — E119 Type 2 diabetes mellitus without complications: Secondary | ICD-10-CM

## 2018-08-15 DIAGNOSIS — N289 Disorder of kidney and ureter, unspecified: Secondary | ICD-10-CM | POA: Insufficient documentation

## 2018-08-15 DIAGNOSIS — E039 Hypothyroidism, unspecified: Secondary | ICD-10-CM

## 2018-08-15 DIAGNOSIS — B351 Tinea unguium: Secondary | ICD-10-CM

## 2018-08-15 DIAGNOSIS — E162 Hypoglycemia, unspecified: Secondary | ICD-10-CM

## 2018-08-15 DIAGNOSIS — M7661 Achilles tendinitis, right leg: Secondary | ICD-10-CM

## 2018-08-15 DIAGNOSIS — I10 Essential (primary) hypertension: Secondary | ICD-10-CM

## 2018-08-15 LAB — POCT GLYCOSYLATED HEMOGLOBIN (HGB A1C): HbA1c, POC (prediabetic range): 6.1 % (ref 5.7–6.4)

## 2018-08-15 LAB — GLUCOSE, POCT (MANUAL RESULT ENTRY)
POC GLUCOSE: 70 mg/dL (ref 70–99)
POC Glucose: 127 mg/dl — AB (ref 70–99)

## 2018-08-15 MED ORDER — DICLOFENAC SODIUM 1 % TD GEL: 2.0000 g | Freq: Four times a day (QID) | TRANSDERMAL | 1 refills | Status: DC

## 2018-08-15 MED ORDER — TERBINAFINE HCL 250 MG PO TABS: 250.0000 mg | ORAL_TABLET | Freq: Every day | ORAL | 0 refills | Status: DC

## 2018-08-15 NOTE — Patient Instructions (Signed)
Please remember to return to the lab to have your blood test done.  Use the Voltaren gel as needed for the pain behind the right heel.  Try to cut back on salt in the foods.  Continue to check your blood pressure.  Goal is 130/80 or lower.

## 2018-08-15 NOTE — Progress Notes (Signed)
Patient ID: Heather Bailey, female    DOB: February 16, 1961  MRN: [id number]  CC: Diabetes   Subjective: Heather Bailey is a 58 y.o. female who presents for chronic ds management Her concerns today include:  Pt with hx of HTN, DM, hypothyroidism, HL, depression, chronic LBP (on Gabapentin), dry skin.  DIABETES TYPE 2 Last A1C:   Results for orders placed or performed in visit on 08/15/18  POCT glucose (manual entry)  Result Value Ref Range   POC Glucose 70 70 - 99 mg/dl  POCT glycosylated hemoglobin (Hb A1C)  Result Value Ref Range   Hemoglobin A1C     HbA1c POC (<> result, manual entry)     HbA1c, POC (prediabetic range) 6.1 5.7 - 6.4 %   HbA1c, POC (controlled diabetic range)      Med Adherence:  _0  Yes    _1  No Medication side effects:  _2  Yes    _3  No Home Monitoring?  _4  Yes    _5  No, glucometer not working.  Blood sugar today when she got here is at 70.  Patient was roomed about 4:45 PM.  Patient states she last ate at 12 noon. Home glucose results range: Diet Adherence: _6  Yes    _7  No.  "Terrible eating habits."   Cooking some but eating more fast foods because she is too drain when she gets off work from new job.  Exercise: _8  Yes "my job keeps me constantly moving."  Does a lot of walking at work.   Hypoglycemic episodes?: _9  Yes    _10  No Numbness of the feet? _11  Yes    _12  No Retinopathy hx? _13  Yes    _14  No Last eye exam: Overdue for eye exam. Comments:   C/o soreness in RT achilles x 1 mth.  She attributes it to walking a lot at work.  She was good comfortable tennis shoes that also has good insoles.  She sometimes feel that the Achilles is a little swollen.  She iced it at night which helps.  HTN: compliant with Toprol.  Does not add salt to the foods but admits that there is probably more salt in the fast foods that she has been eating recently.  No chest pains or shortness of breath.  No lower extremity edema. Checks BP 2 x a mth.  Range 117/80  Planes of  discoloration of toe nails especially on the big toes over the past several months.  She wonders if it is due to diabetes.  She has tried OTC antifungal creams without improvement..   Patient Active Problem List   Diagnosis Date Noted  . Hot flashes 02/23/2017  . Chronic low back pain 04/12/2015  . Dry skin 04/12/2015  . Chronic dryness of both eyes 08/28/2014  . DM2 (diabetes mellitus, type 2) ([place]) 03/19/2014  . Hypothyroidism 03/19/2014  . Hypertension 03/19/2014  . Hyperlipidemia 03/19/2014  . Depression 03/19/2014     Current Outpatient Medications on File Prior to Visit  Medication Sig Dispense Refill  . atorvastatin (LIPITOR) 40 MG tablet Take 1 tablet (40 mg total) by mouth daily. 40 tablet 5  . Blood Glucose Monitoring Suppl (TRUERESULT BLOOD GLUCOSE) w/Device KIT 1 each by Does not apply route 3 (three) times daily before meals. 1 each 0  . cycloSPORINE (RESTASIS) 0.05 % ophthalmic emulsion Place 1 drop into both eyes 2 (two) times daily. For PASS 3 each 4  . cycloSPORINE (RESTASIS) 0.05 % ophthalmic emulsion Place 1 drop into both eyes  2 (two) times daily. 0.4 mL 11  . gabapentin (NEURONTIN) 300 MG capsule Take 1 capsule (300 mg total) by mouth at bedtime. 90 capsule 2  . glucose blood (TRUETEST TEST) test strip 1 each by Other route 3 (three) times daily. Use as instructed 100 each 12  . levothyroxine (SYNTHROID, LEVOTHROID) 50 MCG tablet Take 1 tablet (50 mcg total) by mouth daily. 90 tablet 5  . metFORMIN (GLUCOPHAGE-XR) 500 MG 24 hr tablet Take 2 tablets (1,000 mg total) by mouth daily with breakfast. 180 tablet 2  . metoprolol succinate (TOPROL-XL) 25 MG 24 hr tablet Take 1 tablet (25 mg total) by mouth daily. 90 tablet 3  . sertraline (ZOLOFT) 50 MG tablet Take 1.5 tablets (75 mg total) by mouth daily. 135 tablet 5  . TRUEPLUS LANCETS 28G MISC 1 each by Does not apply route 3 (three) times daily. 100 each 12   No current facility-administered medications on file prior to  visit.     Allergies  Allergen Reactions  . Zithromax [Azithromycin Dihydrate]     Social History   Socioeconomic History  . Marital status: Married    Spouse name: Not on file  . Number of children: Not on file  . Years of education: Not on file  . Highest education level: Not on file  Occupational History  . Not on file  Social Needs  . Financial resource strain: Not on file  . Food insecurity:    Worry: Not on file    Inability: Not on file  . Transportation needs:    Medical: Not on file    Non-medical: Not on file  Tobacco Use  . Smoking status: Never Smoker  . Smokeless tobacco: Never Used  Substance and Sexual Activity  . Alcohol use: No  . Drug use: No  . Sexual activity: Yes    Birth control/protection: Surgical  Lifestyle  . Physical activity:    Days per week: Patient refused    Minutes per session: Patient refused  . Stress: Not on file  Relationships  . Social connections:    Talks on phone: Patient refused    Gets together: Patient refused    Attends religious service: Patient refused    Active member of club or organization: Patient refused    Attends meetings of clubs or organizations: Patient refused    Relationship status: Patient refused  . Intimate partner violence:    Fear of current or ex partner: Patient refused    Emotionally abused: Patient refused    Physically abused: Patient refused    Forced sexual activity: Patient refused  Other Topics Concern  . Not on file  Social History Narrative   Lives with husband who has diabetes.     Family History  Problem Relation Age of Onset  . Heart disease Father   . Hypertension Mother   . Cancer Mother        colon/cervical    Past Surgical History:  Procedure Laterality Date  . CESAREAN SECTION    . MYOMECTOMY    . TUBAL LIGATION      ROS: Review of Systems Negative except as stated above  PHYSICAL EXAM: BP 131/87   Pulse 78   Temp 97.9 F (36.6 C) (Oral)   Resp 16   Wt  202 lb (91.6 kg)   SpO2 97%   BMI 32.60 kg/m   Wt Readings from Last 3 Encounters:  08/15/18 202 lb (91.6 kg)  02/22/18 200 lb (90.7 kg)  08/03/17 197 lb (89.4 kg)   Physical Exam  General appearance - alert, well appearing, and in no distress Mental status - normal mood, behavior, speech, dress, motor activity, and thought processes Neck - supple, no significant adenopathy Chest - clear to auscultation, no wheezes, rales or rhonchi, symmetric air entry Heart - normal rate, regular rhythm, normal S1, S2, no murmurs, rubs, clicks or gallops Extremities - peripheral pulses normal, no pedal edema, no clubbing or cyanosis MSK: Right foot: No swelling noted over the Achilles tendon.  Mild tenderness at the midportion of the Achilles.  She has good flexion and extension at the ankle without discomfort Skin: Proximal half of both nails on the big toes are discolored and thickened Diabetic Foot Exam - Simple   Simple Foot Form Visual Inspection No deformities, no ulcerations, no other skin breakdown bilaterally:  Yes Sensation Testing Intact to touch and monofilament testing bilaterally:  Yes Pulse Check Posterior Tibialis and Dorsalis pulse intact bilaterally:  Yes Comments    Results for orders placed or performed in visit on 08/15/18  POCT glucose (manual entry)  Result Value Ref Range   POC Glucose 70 70 - 99 mg/dl  POCT glycosylated hemoglobin (Hb A1C)  Result Value Ref Range   Hemoglobin A1C     HbA1c POC (<> result, manual entry)     HbA1c, POC (prediabetic range) 6.1 5.7 - 6.4 %   HbA1c, POC (controlled diabetic range)    POCT glucose (manual entry)  Result Value Ref Range   POC Glucose 127 (A) 70 - 99 mg/dl    CMP Latest Ref Rng & Units 02/22/2018 11/23/2016 12/19/2015  Glucose 65 - 99 mg/dL 81 87 89  BUN 6 - 24 mg/dL _0 Creatinine 0.57 - 1.00 mg/dL 1.21(H) 0.98 1.10(H)  Sodium 134 - 144 mmol/L 143 142 142  Potassium 3.5 - 5.2 mmol/L 4.1 4.4 4.9  Chloride 96 -  106 mmol/L 104 104 105  CO2 20 - 29 mmol/L _1 Calcium 8.7 - 10.2 mg/dL 10.2 10.0 10.1  Total Protein 6.0 - 8.5 g/dL 7.6 7.4 7.6  Total Bilirubin 0.0 - 1.2 mg/dL 0.2 <0.2 0.3  Alkaline Phos 39 - 117 IU/L 89 92 75  AST 0 - 40 IU/L _2 ALT 0 - 32 IU/L _3 Lipid Panel     Component Value Date/Time   CHOL 186 02/22/2018 1641   TRIG 100 02/22/2018 1641   HDL 48 02/22/2018 1641   CHOLHDL 3.9 02/22/2018 1641   CHOLHDL 4.3 12/19/2015 1620   VLDL 20 12/19/2015 1620   LDLCALC 118 (H) 02/22/2018 1641    CBC    Component Value Date/Time   WBC 5.7 02/22/2018 1641   WBC 6.6 03/10/2013 2005   RBC 4.24 02/22/2018 1641   RBC 3.99 03/10/2013 2005   HGB 12.2 02/22/2018 1641   HCT 37.9 02/22/2018 1641   PLT 289 02/22/2018 1641   MCV 89 02/22/2018 1641   MCH 28.8 02/22/2018 1641   MCH 29.3 03/10/2013 2005   MCHC 32.2 02/22/2018 1641   MCHC 33.2 03/10/2013 2005   RDW 14.1 02/22/2018 1641    ASSESSMENT AND PLAN: 1. Type 2 diabetes mellitus without complication, without long-term current use of insulin ([place]) 2. Hypoglycemia Given hypoglycemia today, patient was given a small can of soda with subsequent increase in the blood sugar.  She will bring in her meter to see the clinical pharmacist later this week to  see whether it needs a new battery.  If it still does not work I will send a prescription to the pharmacy for a new meter for her.  She will continue current dose of metformin Discussed meal planning where she cooks about 3 days a week and have leftovers on the days when she does not cook.  Encouraged her to avoid fast foods.  We discussed healthy eating/food choices even when she does have to eat on the go - POCT glucose (manual entry) - POCT glycosylated hemoglobin (Hb A1C) - Microalbumin / creatinine urine ratio - POCT glucose (manual entry) - Comprehensive metabolic panel; Future  3. Essential hypertension Repeat blood pressure today close to goal of 130/80 or  lower.  DASH diet discussed and encouraged.  Continue metoprolol  4. Hypothyroidism, unspecified type - TSH; Future  5. Onychomycosis Discussed management.  Patient told that topicals usually do not work.  We can give a trial of oral Lamisil.  I went over possible side effects of the medication including drug-induced hepatitis.  We will have to check a baseline LFT before starting the medication.  Medication is usually taken for 3 months with liver function tests checked once a month while on the medication. Patient advised to stop the medication and come in if she develops any jaundice or unexplained right upper quadrant pain or nausea/vomiting - terbinafine (LAMISIL) 250 MG tablet; Take 1 tablet (250 mg total) by mouth daily.  Dispense: 30 tablet; Refill: 0  6. Achilles tendinitis of right lower extremity - diclofenac sodium (VOLTAREN) 1 % GEL; Apply 2 g topically 4 (four) times daily.  Dispense: 100 g; Refill: 1  7. Renal insufficiency Patient asked about the renal insufficiency seen on last blood test done back in September.  Advised to avoid long-term use of oral NSAIDs.  Encourage good blood pressure control.  Will recheck BMP on blood test today.    Patient was given the opportunity to ask questions.  Patient verbalized understanding of the plan and was able to repeat key elements of the plan.   Orders Placed This Encounter  Procedures  . Microalbumin / creatinine urine ratio  . POCT glucose (manual entry)  . POCT glycosylated hemoglobin (Hb A1C)     Requested Prescriptions    No prescriptions requested or ordered in this encounter    No follow-ups on file.  Karle Plumber, MD, FACP

## 2018-08-15 NOTE — Telephone Encounter (Signed)
Patient has appointment to be seen later today (3/2 @ 4:10). Will defer to PCP for refill.

## 2018-08-16 LAB — MICROALBUMIN / CREATININE URINE RATIO
Creatinine, Urine: 297.2 mg/dL
Microalb/Creat Ratio: 10 mg/g creat (ref 0–29)
Microalbumin, Urine: 30.2 ug/mL

## 2018-08-16 MED FILL — ?ATORVASTATIN 20 MG TABLET: 20 | 30 days supply | Qty: 30 | Fill #0

## 2018-08-16 MED FILL — TERBINAFINE HCL 250 MG TAB: 250 | 30 days supply | Qty: 30 | Fill #0

## 2018-08-16 MED FILL — DICLOFENAC SODIUM 1% GEL: 1 | 25 days supply | Qty: 100 | Fill #0

## 2018-08-17 ENCOUNTER — Ambulatory Visit (HOSPITAL_BASED_OUTPATIENT_CLINIC_OR_DEPARTMENT_OTHER): Payer: Self-pay | Admitting: Pharmacist

## 2018-08-17 ENCOUNTER — Encounter: Payer: Self-pay | Admitting: Pharmacist

## 2018-08-17 ENCOUNTER — Ambulatory Visit: Payer: Self-pay | Attending: Internal Medicine

## 2018-08-17 DIAGNOSIS — E039 Hypothyroidism, unspecified: Secondary | ICD-10-CM

## 2018-08-17 DIAGNOSIS — Z79899 Other long term (current) drug therapy: Secondary | ICD-10-CM

## 2018-08-17 DIAGNOSIS — E119 Type 2 diabetes mellitus without complications: Secondary | ICD-10-CM

## 2018-08-17 MED ORDER — TRUE METRIX METER W/DEVICE KIT: PACK | 0 refills | Status: DC

## 2018-08-17 MED ORDER — TRUEPLUS LANCETS 28G MISC: 2 refills | Status: DC

## 2018-08-17 MED ORDER — TRUEPLUS LANCETS 28G MISC: 2 refills | Status: AC

## 2018-08-17 MED ORDER — GLUCOSE BLOOD VI STRP: ORAL_STRIP | 2 refills | Status: DC

## 2018-08-17 MED ORDER — GLUCOSE BLOOD VI STRP: ORAL_STRIP | 2 refills | Status: AC

## 2018-08-17 MED FILL — !TRUE METRIX BLOOD GLUCOSE: 1 days supply | Qty: 1 | Fill #0

## 2018-08-17 MED FILL — TRUE METRIX GLUCOSE TEST ST: 20 days supply | Qty: 100 | Fill #0

## 2018-08-17 MED FILL — TRUEplus LANCETS 28G MISC: 20 days supply | Qty: 100 | Fill #0

## 2018-08-17 NOTE — Progress Notes (Signed)
Patient here for lab visit only 

## 2018-08-17 NOTE — Patient Instructions (Signed)
Thank you for coming to see me today. Call me for any refill or glucometer issues.   My number is [phone].

## 2018-08-17 NOTE — Progress Notes (Signed)
Patient was educated on the use of the True Metrix blood glucose meter. Reviewed necessary supplies and operation of the meter. Also reviewed goal blood glucose levels. Patient was able to demonstrate use. All questions and concerns were addressed.   

## 2018-08-18 LAB — COMPREHENSIVE METABOLIC PANEL
ALT: 22 IU/L (ref 0–32)
AST: 20 IU/L (ref 0–40)
Albumin/Globulin Ratio: 1.6 (ref 1.2–2.2)
Albumin: 4.6 g/dL (ref 3.8–4.9)
Alkaline Phosphatase: 78 IU/L (ref 39–117)
BILIRUBIN TOTAL: 0.2 mg/dL (ref 0.0–1.2)
BUN/Creatinine Ratio: 15 (ref 9–23)
BUN: 16 mg/dL (ref 6–24)
CHLORIDE: 102 mmol/L (ref 96–106)
CO2: 25 mmol/L (ref 20–29)
Calcium: 10.3 mg/dL — ABNORMAL HIGH (ref 8.7–10.2)
Creatinine, Ser: 1.09 mg/dL — ABNORMAL HIGH (ref 0.57–1.00)
GFR calc Af Amer: 65 mL/min/{1.73_m2} (ref >59)
GFR calc non Af Amer: 56 mL/min/{1.73_m2} — ABNORMAL LOW (ref >59)
Globulin, Total: 2.9 g/dL (ref 1.5–4.5)
Glucose: 84 mg/dL (ref 65–99)
Potassium: 4.5 mmol/L (ref 3.5–5.2)
Sodium: 140 mmol/L (ref 134–144)
Total Protein: 7.5 g/dL (ref 6.0–8.5)

## 2018-08-18 LAB — TSH: TSH: 1.92 u[IU]/mL (ref 0.450–4.500)

## 2018-08-22 ENCOUNTER — Other Ambulatory Visit: Payer: Self-pay | Admitting: Internal Medicine

## 2018-08-22 DIAGNOSIS — F32A Depression, unspecified: Secondary | ICD-10-CM

## 2018-08-22 DIAGNOSIS — R232 Flushing: Secondary | ICD-10-CM

## 2018-08-22 DIAGNOSIS — E119 Type 2 diabetes mellitus without complications: Secondary | ICD-10-CM

## 2018-08-22 DIAGNOSIS — I10 Essential (primary) hypertension: Secondary | ICD-10-CM

## 2018-08-22 DIAGNOSIS — E039 Hypothyroidism, unspecified: Secondary | ICD-10-CM

## 2018-08-22 DIAGNOSIS — F329 Major depressive disorder, single episode, unspecified: Secondary | ICD-10-CM

## 2018-08-22 MED FILL — SERTRALINE HCL 50 MG TABS: 50 | 30 days supply | Qty: 45 | Fill #0

## 2018-08-22 MED FILL — ?LEVOTHYROXINE 50 MCG TABLE: 50 | 30 days supply | Qty: 30 | Fill #0

## 2018-08-22 MED FILL — ?METFORMIN HCL ER 500MG TAB: 500 | 30 days supply | Qty: 60 | Fill #0

## 2018-08-22 MED FILL — ?METOPROLOL SUCC ER 25MG TA: 25 | 30 days supply | Qty: 30 | Fill #0

## 2018-09-19 ENCOUNTER — Other Ambulatory Visit: Payer: Self-pay | Admitting: Internal Medicine

## 2018-09-19 DIAGNOSIS — B351 Tinea unguium: Secondary | ICD-10-CM

## 2018-09-19 DIAGNOSIS — Z79899 Other long term (current) drug therapy: Secondary | ICD-10-CM

## 2018-09-19 MED ORDER — TERBINAFINE HCL 250 MG PO TABS: 250.0000 mg | ORAL_TABLET | Freq: Every day | ORAL | 0 refills | Status: DC

## 2018-09-19 MED FILL — ?ATORVASTATIN 20 MG TABLET: 20 | 90 days supply | Qty: 90 | Fill #1

## 2018-09-19 MED FILL — ?LEVOTHYROXINE 50 MCG TABLE: 50 | 60 days supply | Qty: 60 | Fill #1

## 2018-09-19 MED FILL — ?METOPROLOL SUCC ER 25MG TA: 25 | 60 days supply | Qty: 60 | Fill #1

## 2018-09-19 MED FILL — SERTRALINE HCL 50 MG TABS: 50 | 30 days supply | Qty: 45 | Fill #1

## 2018-09-20 ENCOUNTER — Other Ambulatory Visit: Payer: Self-pay | Admitting: Internal Medicine

## 2018-09-20 DIAGNOSIS — B351 Tinea unguium: Secondary | ICD-10-CM

## 2018-09-20 MED FILL — TERBINAFINE HCL 250 MG TAB: 250 | 30 days supply | Qty: 30 | Fill #0

## 2018-09-23 MED FILL — METFORMIN HCL ER 500 MG TAB: 500 | 30 days supply | Qty: 60 | Fill #1

## 2018-10-05 ENCOUNTER — Other Ambulatory Visit: Payer: Self-pay

## 2018-10-05 ENCOUNTER — Ambulatory Visit: Payer: Self-pay | Attending: Internal Medicine

## 2018-10-05 DIAGNOSIS — Z79899 Other long term (current) drug therapy: Secondary | ICD-10-CM

## 2018-10-05 DIAGNOSIS — B351 Tinea unguium: Secondary | ICD-10-CM

## 2018-10-06 LAB — HEPATIC FUNCTION PANEL
ALT: 18 IU/L (ref 0–32)
AST: 18 IU/L (ref 0–40)
Albumin: 4.6 g/dL (ref 3.8–4.9)
Alkaline Phosphatase: 75 IU/L (ref 39–117)
Bilirubin Total: <0.2 mg/dL (ref 0.0–1.2)
Bilirubin, Direct: 0.06 mg/dL (ref 0.00–0.40)
Total Protein: 7.9 g/dL (ref 6.0–8.5)

## 2018-10-24 MED FILL — METFORMIN HCL ER 500 MG TAB: 500 | 30 days supply | Qty: 60 | Fill #2

## 2018-10-24 MED FILL — SERTRALINE HCL 50 MG TABS: 50 | 30 days supply | Qty: 45 | Fill #2

## 2018-10-31 ENCOUNTER — Other Ambulatory Visit: Payer: Self-pay | Admitting: Internal Medicine

## 2018-10-31 DIAGNOSIS — B351 Tinea unguium: Secondary | ICD-10-CM

## 2018-10-31 MED FILL — TERBINAFINE HCL 250 MG TAB: 250 | 30 days supply | Qty: 30 | Fill #0

## 2018-11-15 ENCOUNTER — Ambulatory Visit: Payer: Self-pay | Attending: Internal Medicine | Admitting: Internal Medicine

## 2018-11-15 ENCOUNTER — Other Ambulatory Visit: Payer: Self-pay

## 2018-11-15 DIAGNOSIS — B351 Tinea unguium: Secondary | ICD-10-CM

## 2018-11-15 DIAGNOSIS — E039 Hypothyroidism, unspecified: Secondary | ICD-10-CM

## 2018-11-15 DIAGNOSIS — M7661 Achilles tendinitis, right leg: Secondary | ICD-10-CM

## 2018-11-15 DIAGNOSIS — J302 Other seasonal allergic rhinitis: Secondary | ICD-10-CM

## 2018-11-15 DIAGNOSIS — E119 Type 2 diabetes mellitus without complications: Secondary | ICD-10-CM

## 2018-11-15 DIAGNOSIS — I10 Essential (primary) hypertension: Secondary | ICD-10-CM

## 2018-11-15 DIAGNOSIS — H43391 Other vitreous opacities, right eye: Secondary | ICD-10-CM

## 2018-11-15 MED ORDER — DICLOFENAC SODIUM 1 % TD GEL: 2.0000 g | Freq: Four times a day (QID) | TRANSDERMAL | 1 refills | Status: DC

## 2018-11-15 NOTE — Progress Notes (Signed)
Pt states she is still having problem with her ankle

## 2018-11-15 NOTE — Progress Notes (Signed)
Virtual Visit via Telephone Note Due to current restrictions/limitations of in-office visits due to the COVID-19 pandemic, this scheduled clinical appointment was converted to a telehealth visit  I connected with Heather Bailey on 11/15/18 at 4:28 p.m EDT by telephone and verified that I am speaking with the correct person using two identifiers. I am in my office.  The patient is at home.  Only the patient and myself participated in this encounter.  I discussed the limitations, risks, security and privacy concerns of performing an evaluation and management service by telephone and the availability of in person appointments. I also discussed with the patient that there may be a patient responsible charge related to this service. The patient expressed understanding and agreed to proceed.  History of Present Illness: Pt with hx of HTN, DM, hypothyroidism, HL, depression, chronic LBP (on Gabapentin), dry skin.  Last seen 08/2018.  Still having a problem with RT achilles tendon. Voltaren Gel helps some but has been out of it for about 1 mth.  Gets some burning medial heel.  Noticed some swelling yesterday.  Still wears good support tennis shoes at work  DM:  Checks BS every few days.  Range 93-119 Med: compliant with Metformin Eating Habits:  "Not there yet."  Still eating out more than she would like.  "Some days when I don't want to cook, I'm bring in dinner." Exercise:  Doing stretching exercises Feels her vision is getting worse, problems with near vision.  Notice floaters in visual field in RT eye x 3 mths.    HYPERTENSION Currently taking: see medication list Med Adherence: [x]  Yes    []  No Medication side effects: []  Yes    [x]  No Adherence with salt restriction: [x]  Yes    []  No Home Monitoring?: [x]  Yes    []  No Monitoring Frequency: once a wk Home BP results range: SBP less than 130 and DBP averaging 83 SOB? []  Yes    [x]  No Chest Pain?: []  Yes    [x]  No Leg swelling?: []  Yes    [x]   No Headaches?: []  Yes    [x]  No Dizziness? []  Yes    [x]  No Comments:    Feels a little congested at times due to allergies.  Takes Bendryl but the next morning it has her feeling drowsy.    TSH:  Compliant with Levothyroxine  Onychomycosis:  She is on her third bottle of Lamisil.  Tolerating okay but has not noticed any significant change in the appearance of her nail   Observations/Objective: No direct observation done as this was a telephone encounter.  Results for orders placed or performed in visit on 10/05/18  Hepatic Function Panel  Result Value Ref Range   Total Protein 7.9 6.0 - 8.5 g/dL   Albumin 4.6 3.8 - 4.9 g/dL   Bilirubin Total <0.2 0.0 - 1.2 mg/dL   Bilirubin, Direct 0.06 0.00 - 0.40 mg/dL   Alkaline Phosphatase 75 39 - 117 IU/L   AST 18 0 - 40 IU/L   ALT 18 0 - 32 IU/L   Lab Results  Component Value Date   WBC 5.7 02/22/2018   HGB 12.2 02/22/2018   HCT 37.9 02/22/2018   MCV 89 02/22/2018   PLT 289 02/22/2018   Lab Results  Component Value Date   TSH 1.920 08/17/2018     Assessment and Plan: 1. Type 2 diabetes mellitus without complication, without long-term current use of insulin ([place]) Reported blood sugar readings at goal.  Continue metformin. Discussed and encourage healthy eating habits.  Strongly advised her to cut back on eating out.  2. Essential hypertension Reported home blood pressure readings close to goal.  Goal is 130/80 or lower.  Continue metoprolol  3. Achilles tendinitis of right lower extremity Recommend referral to podiatry.  However patient wants to hold off for now and wants to continue using the Voltaren gel - diclofenac sodium (VOLTAREN) 1 % GEL; Apply 2 g topically 4 (four) times daily.  Dispense: 100 g; Refill: 1  4. Onychomycosis Advised patient that even with the use of oral Lamisil the nail color and texture may not return to normal.  5. Hypothyroidism, unspecified type Continue levothyroxine  6. Seasonal  allergies Advise use of Claritin which she can purchase over-the-counter as it is a nonsedating antihistamine  7. Floaters in visual field, right Recommend that she schedule an appointment with an optometrist or ophthalmologist as soon as possible.  She will need to call and find out pricing as she is uninsured   Follow Up Instructions: Follow-up again in 3 months   I discussed the assessment and treatment plan with the patient. The patient was provided an opportunity to ask questions and all were answered. The patient agreed with the plan and demonstrated an understanding of the instructions.   The patient was advised to call back or seek an in-person evaluation if the symptoms worsen or if the condition fails to improve as anticipated.  I provided 20 minutes of non-face-to-face time during this encounter.   Karle Plumber, MD

## 2018-11-16 MED FILL — DICLOFENAC SODIUM 1% GEL: 1 | 16 days supply | Qty: 100 | Fill #0

## 2018-11-28 ENCOUNTER — Other Ambulatory Visit: Payer: Self-pay | Admitting: Internal Medicine

## 2018-11-28 DIAGNOSIS — F329 Major depressive disorder, single episode, unspecified: Secondary | ICD-10-CM

## 2018-11-28 DIAGNOSIS — I10 Essential (primary) hypertension: Secondary | ICD-10-CM

## 2018-11-28 DIAGNOSIS — F32A Depression, unspecified: Secondary | ICD-10-CM

## 2018-11-28 DIAGNOSIS — R232 Flushing: Secondary | ICD-10-CM

## 2018-11-28 DIAGNOSIS — B351 Tinea unguium: Secondary | ICD-10-CM

## 2018-11-28 DIAGNOSIS — E039 Hypothyroidism, unspecified: Secondary | ICD-10-CM

## 2018-11-29 MED FILL — ?LEVOTHYROXINE 50 MCG TABLE: 50 | 30 days supply | Qty: 30 | Fill #0

## 2018-11-29 MED FILL — ?METOPROLOL SUCC ER 25MG TA: 25 | 30 days supply | Qty: 30 | Fill #0

## 2018-11-29 MED FILL — SERTRALINE HCL 50 MG TABS: 50 | 30 days supply | Qty: 45 | Fill #0

## 2018-11-30 ENCOUNTER — Other Ambulatory Visit: Payer: Self-pay | Admitting: Internal Medicine

## 2018-11-30 DIAGNOSIS — E119 Type 2 diabetes mellitus without complications: Secondary | ICD-10-CM

## 2018-11-30 MED FILL — METFORMIN HCL ER 500 MG TAB: 500 | 30 days supply | Qty: 60 | Fill #0

## 2018-12-19 MED FILL — ?ATORVASTATIN 20 MG TABLET: 20 | 90 days supply | Qty: 90 | Fill #2

## 2018-12-26 MED FILL — SERTRALINE HCL 50 MG TABLET: 50 | 30 days supply | Qty: 45 | Fill #1

## 2018-12-26 MED FILL — METFORMIN HCL ER 500 MG TB2: 500 | 30 days supply | Qty: 60 | Fill #1

## 2018-12-26 MED FILL — ?LEVOTHYROXINE 50 MCG TABLE: 50 | 30 days supply | Qty: 30 | Fill #1

## 2018-12-27 MED FILL — ?ATORVASTATIN 20 MG TABLET: 20 | 30 days supply | Qty: 30 | Fill #2

## 2019-01-09 MED FILL — ?METOPROLOL SUCC ER 25MG TA: 25 | 30 days supply | Qty: 30 | Fill #1

## 2019-01-30 MED FILL — METFORMIN HCL ER 500 MG TB2: 500 | 30 days supply | Qty: 60 | Fill #2

## 2019-01-30 MED FILL — ?METOPROLOL SUCC ER 25MG TA: 25 | 30 days supply | Qty: 30 | Fill #2

## 2019-01-30 MED FILL — SERTRALINE HCL 50 MG TABS: 50 | 30 days supply | Qty: 45 | Fill #2

## 2019-01-30 MED FILL — ?ATORVASTATIN 20 MG TABLET: 20 | 30 days supply | Qty: 30 | Fill #3

## 2019-01-30 MED FILL — ?LEVOTHYROXINE 50 MCG TABLE: 50 | 30 days supply | Qty: 30 | Fill #2

## 2019-03-03 ENCOUNTER — Other Ambulatory Visit: Payer: Self-pay

## 2019-03-03 ENCOUNTER — Ambulatory Visit: Payer: Self-pay | Attending: Family Medicine | Admitting: Family Medicine

## 2019-03-03 ENCOUNTER — Encounter: Payer: Self-pay | Admitting: Family Medicine

## 2019-03-03 VITALS — BP 120/87 | HR 76 | Temp 98.5°F | Ht 66.0 in | Wt 204.6 lb

## 2019-03-03 DIAGNOSIS — I10 Essential (primary) hypertension: Secondary | ICD-10-CM

## 2019-03-03 DIAGNOSIS — Z23 Encounter for immunization: Secondary | ICD-10-CM

## 2019-03-03 DIAGNOSIS — M7661 Achilles tendinitis, right leg: Secondary | ICD-10-CM

## 2019-03-03 DIAGNOSIS — F329 Major depressive disorder, single episode, unspecified: Secondary | ICD-10-CM

## 2019-03-03 DIAGNOSIS — E119 Type 2 diabetes mellitus without complications: Secondary | ICD-10-CM

## 2019-03-03 DIAGNOSIS — E039 Hypothyroidism, unspecified: Secondary | ICD-10-CM

## 2019-03-03 DIAGNOSIS — F32A Depression, unspecified: Secondary | ICD-10-CM

## 2019-03-03 DIAGNOSIS — N3000 Acute cystitis without hematuria: Secondary | ICD-10-CM

## 2019-03-03 LAB — POCT GLYCOSYLATED HEMOGLOBIN (HGB A1C): Hemoglobin A1C: 6.1 % — AB (ref 4.0–5.6)

## 2019-03-03 LAB — POCT URINALYSIS DIP (CLINITEK)
Bilirubin, UA: NEGATIVE
Glucose, UA: NEGATIVE mg/dL
Ketones, POC UA: NEGATIVE mg/dL
Nitrite, UA: NEGATIVE
POC PROTEIN,UA: NEGATIVE
Spec Grav, UA: >=1.03 — AB
Urobilinogen, UA: 0.2 U/dL
pH, UA: 5

## 2019-03-03 MED ORDER — LEVOTHYROXINE SODIUM 50 MCG PO TABS: 50.0000 ug | ORAL_TABLET | Freq: Every day | ORAL | 1 refills | Status: DC

## 2019-03-03 MED ORDER — SULFAMETHOXAZOLE-TRIMETHOPRIM 800-160 MG PO TABS: 1.0000 | ORAL_TABLET | Freq: Two times a day (BID) | ORAL | 0 refills | Status: DC

## 2019-03-03 MED ORDER — METOPROLOL SUCCINATE ER 25 MG PO TB24: 25.0000 mg | ORAL_TABLET | Freq: Every day | ORAL | 1 refills | Status: DC

## 2019-03-03 MED ORDER — METFORMIN HCL ER 500 MG PO TB24: ORAL_TABLET | ORAL | 1 refills | Status: DC

## 2019-03-03 MED ORDER — DICLOFENAC SODIUM 1 % TD GEL: 2.0000 g | Freq: Four times a day (QID) | TRANSDERMAL | 1 refills | Status: DC

## 2019-03-03 MED ORDER — ATORVASTATIN CALCIUM 20 MG PO TABS: 20.0000 mg | ORAL_TABLET | Freq: Every day | ORAL | 1 refills | Status: DC

## 2019-03-03 MED ORDER — SERTRALINE HCL 50 MG PO TABS: 75.0000 mg | ORAL_TABLET | Freq: Every day | ORAL | 1 refills | Status: DC

## 2019-03-03 MED FILL — ?ATORVASTATIN 20 MG TABLET: 20 | 30 days supply | Qty: 30 | Fill #0

## 2019-03-03 MED FILL — DICLOFENAC SODIUM 1% GEL: 1 | 12 days supply | Qty: 100 | Fill #0

## 2019-03-03 MED FILL — METFORMIN HCL ER 500 MG TB2: 500 | 60 days supply | Qty: 60 | Fill #0

## 2019-03-03 MED FILL — ?METOPROLOL SUCC ER 25MG TA: 25 | 30 days supply | Qty: 30 | Fill #0

## 2019-03-03 MED FILL — ?LEVOTHYROXINE SOD 50 MCG: 50 | 30 days supply | Qty: 30 | Fill #0

## 2019-03-03 MED FILL — SERTRALINE HCL 50 MG TABS: 50 | 30 days supply | Qty: 45 | Fill #0

## 2019-03-03 NOTE — Patient Instructions (Signed)
Blood Glucose Monitoring, Adult °Monitoring your blood sugar (glucose) is an important part of managing your diabetes (diabetes mellitus). Blood glucose monitoring involves checking your blood glucose as often as directed and keeping a record (log) of your results over time. °Checking your blood glucose regularly and keeping a blood glucose log can: °· Help you and your health care provider adjust your diabetes management plan as needed, including your medicines or insulin. °· Help you understand how food, exercise, illnesses, and medicines affect your blood glucose. °· Let you know what your blood glucose is at any time. You can quickly find out if you have low blood glucose (hypoglycemia) or high blood glucose (hyperglycemia). °Your health care provider will set individualized treatment goals for you. Your goals will be based on your age, other medical conditions you have, and how you respond to diabetes treatment. Generally, the goal of treatment is to maintain the following blood glucose levels: °· Before meals (preprandial): 80-130 mg/dL (4.4-7.2 mmol/L). °· After meals (postprandial): below 180 mg/dL (10 mmol/L). °· A1c level: less than 7%. °Supplies needed: °· Blood glucose meter. °· Test strips for your meter. Each meter has its own strips. You must use the strips that came with your meter. °· A needle to prick your finger (lancet). Do not use a lancet more than one time. °· A device that holds the lancet (lancing device). °· A journal or log book to write down your results. °How to check your blood glucose ° °1. Wash your hands with soap and water. °2. Prick the side of your finger (not the tip) with the lancet. Use a different finger each time. °3. Gently rub the finger until a small drop of blood appears. °4. Follow instructions that come with your meter for inserting the test strip, applying blood to the strip, and using your blood glucose meter. °5. Write down your result and any notes. °Some meters  allow you to use areas of your body other than your finger (alternative sites) to test your blood. The most common alternative sites are: °· Forearm. °· Thigh. °· Palm of the hand. °If you think you may have hypoglycemia, or if you have a history of not knowing when your blood glucose is getting low (hypoglycemia unawareness), do not use alternative sites. Use your finger instead. Alternative sites may not be as accurate as the fingers, because blood flow is slower in these areas. This means that the result you get may be delayed, and it may be different from the result that you would get from your finger. °Follow these instructions at home: °Blood glucose log ° °· Every time you check your blood glucose, write down your result. Also write down any notes about things that may be affecting your blood glucose, such as your diet and exercise for the day. This information can help you and your health care provider: °? Look for patterns in your blood glucose over time. °? Adjust your diabetes management plan as needed. °· Check if your meter allows you to download your records to a computer. Most glucose meters store a record of glucose readings in the meter. °If you have type 1 diabetes: °· Check your blood glucose 2 or more times a day. °· Also check your blood glucose: °? Before every insulin injection. °? Before and after exercise. °? Before meals. °? 2 hours after a meal. °? Occasionally between 2:00 a.m. and 3:00 a.m., as directed. °? Before potentially dangerous tasks, like driving or using heavy machinery. °?   At bedtime. °· You may need to check your blood glucose more often, up to 6-10 times a day, if you: °? Use an insulin pump. °? Need multiple daily injections (MDI). °? Have diabetes that is not well-controlled. °? Are ill. °? Have a history of severe hypoglycemia. °? Have hypoglycemia unawareness. °If you have type 2 diabetes: °· If you take insulin or other diabetes medicines, check your blood glucose 2 or  more times a day. °· If you are on intensive insulin therapy, check your blood glucose 4 or more times a day. Occasionally, you may also need to check between 2:00 a.m. and 3:00 a.m., as directed. °· Also check your blood glucose: °? Before and after exercise. °? Before potentially dangerous tasks, like driving or using heavy machinery. °· You may need to check your blood glucose more often if: °? Your medicine is being adjusted. °? Your diabetes is not well-controlled. °? You are ill. °General tips °· Always keep your supplies with you. °· If you have questions or need help, all blood glucose meters have a 24-hour "hotline" phone number that you can call. You may also contact your health care provider. °· After you use a few boxes of test strips, adjust (calibrate) your blood glucose meter by following instructions that came with your meter. °Contact a health care provider if: °· Your blood glucose is at or above 240 mg/dL (13.3 mmol/L) for 2 days in a row. °· You have been sick or have had a fever for 2 days or longer, and you are not getting better. °· You have any of the following problems for more than 6 hours: °? You cannot eat or drink. °? You have nausea or vomiting. °? You have diarrhea. °Get help right away if: °· Your blood glucose is lower than 54 mg/dL (3 mmol/L). °· You become confused or you have trouble thinking clearly. °· You have difficulty breathing. °· You have moderate or large ketone levels in your urine. °Summary °· Monitoring your blood sugar (glucose) is an important part of managing your diabetes (diabetes mellitus). °· Blood glucose monitoring involves checking your blood glucose as often as directed and keeping a record (log) of your results over time. °· Your health care provider will set individualized treatment goals for you. Your goals will be based on your age, other medical conditions you have, and how you respond to diabetes treatment. °· Every time you check your blood glucose,  write down your result. Also write down any notes about things that may be affecting your blood glucose, such as your diet and exercise for the day. °This information is not intended to replace advice given to you by your health care provider. Make sure you discuss any questions you have with your health care provider. °Document Released: 06/04/2003 Document Revised: 03/25/2018 Document Reviewed: 11/11/2015 °Elsevier Patient Education © 2020 [employer] ° °
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
## 2019-03-06 MED FILL — ?SULFAMETHOXAZOLE-TMP DS TB: 800-160 | 3 days supply | Qty: 6 | Fill #0

## 2019-04-03 MED FILL — ?ATORVASTATIN 20 MG TABLET: 20 | 30 days supply | Qty: 30 | Fill #1

## 2019-04-03 MED FILL — ?LEVOTHYROXINE SOD 50 MCG: 50 | 30 days supply | Qty: 30 | Fill #1

## 2019-04-03 MED FILL — ?METOPROLOL SUCC ER 25MG TA: 25 | 30 days supply | Qty: 30 | Fill #1

## 2019-04-03 MED FILL — SERTRALINE HCL 50 MG TABS: 50 | 30 days supply | Qty: 45 | Fill #1

## 2019-04-10 MED FILL — METFORMIN HCL ER 500 MG TB2: 500 | 30 days supply | Qty: 60 | Fill #1

## 2019-06-14 MED FILL — ?LEVOTHYROXINE SOD 50 MCG: 50 | 30 days supply | Qty: 30 | Fill #3

## 2019-06-14 MED FILL — ?ATORVASTATIN 20 MG TABLET: 20 | 30 days supply | Qty: 30 | Fill #3

## 2019-06-14 MED FILL — SERTRALINE HCL 50 MG TABS: 50 | 30 days supply | Qty: 45 | Fill #3

## 2019-06-14 MED FILL — ?METOPROLOL SUCC ER 25MG TA: 25 | 30 days supply | Qty: 30 | Fill #3

## 2019-06-14 MED FILL — metFORMIN HCL ER 500 MG TB2: 500 | 30 days supply | Qty: 60 | Fill #3

## 2019-07-03 ENCOUNTER — Other Ambulatory Visit: Payer: Self-pay

## 2019-07-03 ENCOUNTER — Ambulatory Visit: Payer: Self-pay | Attending: Internal Medicine | Admitting: Internal Medicine

## 2019-07-04 ENCOUNTER — Ambulatory Visit: Payer: Self-pay | Admitting: Internal Medicine

## 2019-07-05 NOTE — Progress Notes (Signed)
No show

## 2019-07-06 ENCOUNTER — Other Ambulatory Visit: Payer: Self-pay

## 2019-07-06 ENCOUNTER — Ambulatory Visit: Payer: Self-pay | Attending: Internal Medicine | Admitting: Internal Medicine

## 2019-07-06 DIAGNOSIS — I10 Essential (primary) hypertension: Secondary | ICD-10-CM

## 2019-07-06 DIAGNOSIS — E119 Type 2 diabetes mellitus without complications: Secondary | ICD-10-CM

## 2019-07-06 DIAGNOSIS — Z1211 Encounter for screening for malignant neoplasm of colon: Secondary | ICD-10-CM

## 2019-07-06 NOTE — Progress Notes (Signed)
Virtual Visit via Telephone Note Due to current restrictions/limitations of in-office visits due to the COVID-19 pandemic, this scheduled clinical appointment was converted to a telehealth visit  I connected with Heather Bailey on 07/06/19 at 5:21 p.m by telephone and verified that I am speaking with the correct person using two identifiers. I am in my office.  The patient is at home.  Only the patient and myself participated in this encounter.  I discussed the limitations, risks, security and privacy concerns of performing an evaluation and management service by telephone and the availability of in person appointments. I also discussed with the patient that there may be a patient responsible charge related to this service. The patient expressed understanding and agreed to proceed.   History of Present Illness: Pt with hx of HTN, DM, hypothyroidism, HL, depression, chronic LBP (on Gabapentin), dry skin.  DM:  Checking BS few times a wk.  Range 90-120s.  Highest 136.  Taking Metformin daily Eating habits:  Still eating too much fried foods.  "I need to work on that."  Eating veggies Does some aerobic but not as much as she would like Over due for eye exam.  Blurred vision with reading.  Plans to go to [facility].   HTN: checking BP intermittently at a pharmacy.  Last reading was 128/90 which is higher for her than usual.  Got new monitor for christmas but has not use it as yet. Limits salt in foods No CP.  Some SOB which she attributes to not getting the exercise she needs  Hypothyroid:  Consistent with taking Levothyroxine.   Outpatient Encounter Medications as of 07/06/2019  Medication Sig  . atorvastatin (LIPITOR) 20 MG tablet Take 1 tablet (20 mg total) by mouth daily.  Marland Kitchen gabapentin (NEURONTIN) 300 MG capsule Take 1 capsule (300 mg total) by mouth at bedtime.  Marland Kitchen levothyroxine (SYNTHROID) 50 MCG tablet Take 1 tablet (50 mcg total) by mouth daily.  . metFORMIN (GLUCOPHAGE-XR) 500 MG  24 hr tablet TAKE 2 TABLETS BY MOUTH DAILY WITH BREAKFAST.  . metoprolol succinate (TOPROL-XL) 25 MG 24 hr tablet Take 1 tablet (25 mg total) by mouth daily.  . sertraline (ZOLOFT) 50 MG tablet Take 1.5 tablets (75 mg total) by mouth daily.  . cycloSPORINE (RESTASIS) 0.05 % ophthalmic emulsion Place 1 drop into both eyes 2 (two) times daily. (Patient not taking: Reported on 03/03/2019)  . diclofenac sodium (VOLTAREN) 1 % GEL Apply 2 g topically 4 (four) times daily.  Marland Kitchen glucose blood (TRUE METRIX BLOOD GLUCOSE TEST) test strip Use as instructed to check blood sugar up to 3 times daily.  . TRUEPLUS LANCETS 28G MISC Use to check blood sugar up to 3 times daily.   No facility-administered encounter medications on file as of 07/06/2019.      Observations/Objective: Lab Results  Component Value Date   HGBA1C 6.1 (A) 03/03/2019     Chemistry      Component Value Date/Time   NA 140 08/17/2018 1503   K 4.5 08/17/2018 1503   CL 102 08/17/2018 1503   CO2 25 08/17/2018 1503   BUN 16 08/17/2018 1503   CREATININE 1.09 (H) 08/17/2018 1503   CREATININE 1.10 (H) 12/19/2015 1620      Component Value Date/Time   CALCIUM 10.3 (H) 08/17/2018 1503   ALKPHOS 75 10/05/2018 1553   AST 18 10/05/2018 1553   ALT 18 10/05/2018 1553   BILITOT <0.2 10/05/2018 1553       Assessment and  Plan: 1. Type 2 diabetes mellitus without complication, without long-term current use of insulin (HCC) Cont Metformin Dietary and exercise counseling given Come to lab for routine labs - Hemoglobin A1c; Future - CBC; Future - Comprehensive metabolic panel; Future - Lipid panel; Future  2. Essential hypertension Cont Toprol Recommend checking BP 1-2 x a wk with goal being 130/80 or lower  3. Screening for colon cancer - Fecal occult blood, imunochemical([facility])   Follow Up Instructions: 4 mths   I discussed the assessment and treatment plan with the patient. The patient was provided an opportunity to  ask questions and all were answered. The patient agreed with the plan and demonstrated an understanding of the instructions.   The patient was advised to call back or seek an in-person evaluation if the symptoms worsen or if the condition fails to improve as anticipated.  I provided 10 minutes of non-face-to-face time during this encounter.   Karle Plumber, MD

## 2019-07-10 ENCOUNTER — Other Ambulatory Visit: Payer: Self-pay

## 2019-07-10 ENCOUNTER — Ambulatory Visit: Payer: Self-pay | Attending: Internal Medicine

## 2019-07-10 DIAGNOSIS — E119 Type 2 diabetes mellitus without complications: Secondary | ICD-10-CM

## 2019-07-11 LAB — CBC
Hematocrit: 37.4 % (ref 34.0–46.6)
Hemoglobin: 12.9 g/dL (ref 11.1–15.9)
MCH: 29.8 pg (ref 26.6–33.0)
MCHC: 34.5 g/dL (ref 31.5–35.7)
MCV: 86 fL (ref 79–97)
Platelets: 283 10*3/uL (ref 150–450)
RBC: 4.33 x10E6/uL (ref 3.77–5.28)
RDW: 12.9 % (ref 11.7–15.4)
WBC: 4.5 10*3/uL (ref 3.4–10.8)

## 2019-07-11 LAB — COMPREHENSIVE METABOLIC PANEL
ALT: 19 IU/L (ref 0–32)
AST: 19 IU/L (ref 0–40)
Albumin/Globulin Ratio: 1.8 (ref 1.2–2.2)
Albumin: 4.8 g/dL (ref 3.8–4.9)
Alkaline Phosphatase: 90 IU/L (ref 39–117)
BUN/Creatinine Ratio: 12 (ref 9–23)
BUN: 14 mg/dL (ref 6–24)
Bilirubin Total: 0.4 mg/dL (ref 0.0–1.2)
CO2: 25 mmol/L (ref 20–29)
Calcium: 10.4 mg/dL — ABNORMAL HIGH (ref 8.7–10.2)
Chloride: 105 mmol/L (ref 96–106)
Creatinine, Ser: 1.13 mg/dL — ABNORMAL HIGH (ref 0.57–1.00)
GFR calc Af Amer: 62 mL/min/{1.73_m2} (ref >59)
GFR calc non Af Amer: 54 mL/min/{1.73_m2} — ABNORMAL LOW (ref >59)
Globulin, Total: 2.6 g/dL (ref 1.5–4.5)
Glucose: 90 mg/dL (ref 65–99)
Potassium: 5.2 mmol/L (ref 3.5–5.2)
Sodium: 143 mmol/L (ref 134–144)
Total Protein: 7.4 g/dL (ref 6.0–8.5)

## 2019-07-11 LAB — HEMOGLOBIN A1C
Est. average glucose Bld gHb Est-mCnc: 131 mg/dL
Hgb A1c MFr Bld: 6.2 % — ABNORMAL HIGH (ref 4.8–5.6)

## 2019-07-11 LAB — LIPID PANEL
Chol/HDL Ratio: 4.3 ratio (ref 0.0–4.4)
Cholesterol, Total: 175 mg/dL (ref 100–199)
HDL: 41 mg/dL (ref >39)
LDL Chol Calc (NIH): 116 mg/dL — ABNORMAL HIGH (ref 0–99)
Triglycerides: 97 mg/dL (ref 0–149)
VLDL Cholesterol Cal: 18 mg/dL (ref 5–40)

## 2019-07-12 ENCOUNTER — Encounter: Payer: Self-pay | Admitting: Internal Medicine

## 2019-07-12 DIAGNOSIS — E119 Type 2 diabetes mellitus without complications: Secondary | ICD-10-CM

## 2019-07-12 MED ORDER — ATORVASTATIN CALCIUM 40 MG PO TABS: 40.0000 mg | ORAL_TABLET | Freq: Every day | ORAL | 1 refills | Status: DC

## 2019-07-12 MED FILL — ?ATORVASTATIN 40MG TABL: 40 | 30 days supply | Qty: 30 | Fill #0

## 2019-07-12 NOTE — Addendum Note (Signed)
Addended by: Karle Plumber B on: 07/12/2019 10:06 AM   Modules accepted: Orders

## 2019-07-14 ENCOUNTER — Other Ambulatory Visit: Payer: Self-pay | Admitting: Internal Medicine

## 2019-07-14 LAB — SPECIMEN STATUS REPORT

## 2019-07-14 LAB — TSH: TSH: 3.63 u[IU]/mL (ref 0.450–4.500)

## 2019-07-14 LAB — VITAMIN D 25 HYDROXY (VIT D DEFICIENCY, FRACTURES): Vit D, 25-Hydroxy: 35.1 ng/mL (ref 30.0–100.0)

## 2019-07-18 ENCOUNTER — Ambulatory Visit: Payer: Self-pay | Attending: Internal Medicine

## 2019-07-18 ENCOUNTER — Other Ambulatory Visit: Payer: Self-pay

## 2019-07-18 MED FILL — ?ATORVASTATIN 20 MG TABLET: 20 | 30 days supply | Qty: 30 | Fill #4

## 2019-07-18 MED FILL — SERTRALINE HCL 50 MG TABS: 50 | 30 days supply | Qty: 45 | Fill #4

## 2019-07-18 MED FILL — metFORMIN HCL ER 500 MG TB2: 500 | 30 days supply | Qty: 60 | Fill #4

## 2019-07-18 MED FILL — METOPROLOL SUCCINATE ER 25: 25 | 30 days supply | Qty: 30 | Fill #4

## 2019-07-18 MED FILL — ?LEVOTHYROXINE 50 MCG TABLE: 50 | 30 days supply | Qty: 30 | Fill #4

## 2019-07-19 LAB — VITAMIN D 25 HYDROXY (VIT D DEFICIENCY, FRACTURES): Vit D, 25-Hydroxy: 34.8 ng/mL (ref 30.0–100.0)

## 2019-07-19 LAB — PARATHYROID HORMONE, INTACT (NO CA): PTH: 22 pg/mL (ref 15–65)

## 2019-07-19 LAB — TSH: TSH: 4.01 u[IU]/mL (ref 0.450–4.500)

## 2019-07-27 LAB — FECAL OCCULT BLOOD, IMMUNOCHEMICAL: Fecal Occult Bld: NEGATIVE

## 2019-07-31 ENCOUNTER — Telehealth: Payer: Self-pay

## 2019-07-31 NOTE — Telephone Encounter (Signed)
Telephoned patient at mobile number. Left a voice message for patient to call and schedule with [facility].

## 2019-08-02 ENCOUNTER — Other Ambulatory Visit: Payer: Self-pay

## 2019-08-02 DIAGNOSIS — Z1231 Encounter for screening mammogram for malignant neoplasm of breast: Secondary | ICD-10-CM

## 2019-08-18 MED FILL — SERTRALINE HCL 50 MG TABLET: 50 | 30 days supply | Qty: 45 | Fill #5

## 2019-08-18 MED FILL — metFORMIN HCL ER 500 MG TB2: 500 | 30 days supply | Qty: 60 | Fill #5

## 2019-08-18 MED FILL — METOPROLOL SUCCINATE ER 25: 25 | 30 days supply | Qty: 30 | Fill #5

## 2019-08-18 MED FILL — ?LEVOTHYROXINE 50 MCG TABLE: 50 | 30 days supply | Qty: 30 | Fill #5

## 2019-09-05 ENCOUNTER — Ambulatory Visit: Payer: Self-pay | Admitting: Student

## 2019-09-05 ENCOUNTER — Other Ambulatory Visit: Payer: Self-pay

## 2019-09-05 ENCOUNTER — Ambulatory Visit
Admission: RE | Admit: 2019-09-05 | Discharge: 2019-09-05 | Disposition: A | Discharge status: Home / Self Care | Payer: No Typology Code available for payment source | Source: Ambulatory Visit | Attending: Obstetrics and Gynecology | Admitting: Obstetrics and Gynecology

## 2019-09-05 ENCOUNTER — Encounter: Payer: Self-pay | Admitting: Student

## 2019-09-05 VITALS — BP 136/84 | Temp 97.1°F | Wt 205.0 lb

## 2019-09-05 DIAGNOSIS — Z1231 Encounter for screening mammogram for malignant neoplasm of breast: Secondary | ICD-10-CM

## 2019-09-05 DIAGNOSIS — Z124 Encounter for screening for malignant neoplasm of cervix: Secondary | ICD-10-CM

## 2019-09-05 NOTE — Addendum Note (Signed)
Addended by: Demetrius Revel on: 09/05/2019 11:32 AM   Modules accepted: Orders

## 2019-09-05 NOTE — Progress Notes (Signed)
[name] is a 59 y.o. [phone] female who presents to [facility] clinic today with no complaints.    Pap Smear: Pap smear completed today. Last Pap smear was 02/13/2014 at [facility] clinic and was normal. Per patient has history of an abnormal Pap smear several years ago. Had some colposcopies which were normal. Per previous not had cryosurgery but patient is unsure about this. Last Pap smear result is available in Epic.   Physical exam: Breasts Breasts symmetrical. No skin abnormalities bilateral breasts. No nipple retraction bilateral breasts. No nipple discharge bilateral breasts. No lymphadenopathy. No lumps palpated bilateral breasts.       Pelvic/Bimanual Ext Genitalia No lesions, no swelling and no discharge observed on external genitalia.        Vagina Vagina pink and normal texture. No lesions or discharge observed in vagina.        Cervix Cervix is present. Cervix pink and of normal texture. No discharge observed.    Uterus Uterus is present and palpable. Uterus in normal position and normal size.        Adnexae Bilateral ovaries present and palpable. No tenderness on palpation.         Rectovaginal No rectal exam completed today since patient had no rectal complaints. No skin abnormalities observed on exam.     Smoking History: Patient has never smoked. Not referred to quit line.    Patient Navigation: Patient education provided. Access to services provided for patient through [place] program.    Colorectal Cancer Screening: Per patient has never had colonoscopy completed but does have yearly FOBT testing with her PCP. Last was 07/26/2019 & was negative. No complaints today.    Breast and Cervical Cancer Risk Assessment: Patient does not have family history of breast cancer, known genetic mutations, or radiation treatment to the chest before age 75. Patient does not have history of cervical dysplasia, immunocompromised, or DES exposure in-utero.  Risk Assessment    Risk  Scores      09/05/2019   Last edited by: Demetrius Revel, LPN   5-year risk: 1.1 %   Lifetime risk: 5.6 %          A: [facility] exam with pap smear   P: Referred patient to the [facility] for a screening mammogram. Appointment scheduled later today.  Jorje Guild, NP 09/05/2019 11:11 AM

## 2019-09-06 LAB — CYTOLOGY - PAP
Comment: NEGATIVE
Diagnosis: NEGATIVE
High risk HPV: NEGATIVE

## 2019-09-11 ENCOUNTER — Other Ambulatory Visit: Payer: Self-pay | Admitting: Family Medicine

## 2019-09-11 ENCOUNTER — Telehealth: Payer: Self-pay

## 2019-09-11 DIAGNOSIS — I10 Essential (primary) hypertension: Secondary | ICD-10-CM

## 2019-09-11 DIAGNOSIS — F329 Major depressive disorder, single episode, unspecified: Secondary | ICD-10-CM

## 2019-09-11 DIAGNOSIS — E039 Hypothyroidism, unspecified: Secondary | ICD-10-CM

## 2019-09-11 DIAGNOSIS — F32A Depression, unspecified: Secondary | ICD-10-CM

## 2019-09-11 DIAGNOSIS — E119 Type 2 diabetes mellitus without complications: Secondary | ICD-10-CM

## 2019-09-11 MED FILL — ?ATORVASTATIN 40MG TABLET: 40 | 30 days supply | Qty: 30 | Fill #1

## 2019-09-11 NOTE — Telephone Encounter (Signed)
Normal Pap result letter mailed.

## 2019-09-12 MED FILL — ?METOPROLOL SUCC ER 25MG TA: 25 | 30 days supply | Qty: 30 | Fill #0

## 2019-09-12 MED FILL — SERTRALINE HCL 50 MG TABLET: 50 | 30 days supply | Qty: 45 | Fill #0

## 2019-09-12 MED FILL — metFORMIN HCL ER 500 MG TB2: 500 | 30 days supply | Qty: 60 | Fill #0

## 2019-09-12 MED FILL — LEVOTHYROXINE SODIUM 50 MCG: 50 | 30 days supply | Qty: 30 | Fill #0

## 2019-10-16 MED FILL — ?METOPROLOL SUCC ER 25MG TA: 25 | 30 days supply | Qty: 30 | Fill #1

## 2019-10-16 MED FILL — LEVOTHYROXINE SODIUM 50 MCG: 50 | 30 days supply | Qty: 30 | Fill #1

## 2019-10-16 MED FILL — metFORMIN HCL ER 500 MG TB2: 500 | 30 days supply | Qty: 60 | Fill #1

## 2019-10-16 MED FILL — ?ATORVASTATIN 40MG TABLET: 40 | 30 days supply | Qty: 30 | Fill #2

## 2019-10-16 MED FILL — SERTRALINE HCL 50 MG TABLET: 50 | 30 days supply | Qty: 45 | Fill #1

## 2019-11-03 ENCOUNTER — Encounter: Payer: Self-pay | Admitting: Internal Medicine

## 2019-11-03 ENCOUNTER — Other Ambulatory Visit: Payer: Self-pay

## 2019-11-03 ENCOUNTER — Ambulatory Visit: Payer: Self-pay | Attending: Internal Medicine | Admitting: Internal Medicine

## 2019-11-03 VITALS — BP 126/84 | HR 83 | Temp 96.4°F | Resp 16 | Ht 66.0 in | Wt 200.4 lb

## 2019-11-03 DIAGNOSIS — E119 Type 2 diabetes mellitus without complications: Secondary | ICD-10-CM

## 2019-11-03 DIAGNOSIS — I10 Essential (primary) hypertension: Secondary | ICD-10-CM

## 2019-11-03 DIAGNOSIS — E66811 Obesity, class 1: Secondary | ICD-10-CM | POA: Insufficient documentation

## 2019-11-03 DIAGNOSIS — E039 Hypothyroidism, unspecified: Secondary | ICD-10-CM

## 2019-11-03 DIAGNOSIS — E6609 Other obesity due to excess calories: Secondary | ICD-10-CM

## 2019-11-03 DIAGNOSIS — E669 Obesity, unspecified: Secondary | ICD-10-CM

## 2019-11-03 DIAGNOSIS — E785 Hyperlipidemia, unspecified: Secondary | ICD-10-CM | POA: Insufficient documentation

## 2019-11-03 DIAGNOSIS — Z6832 Body mass index (BMI) 32.0-32.9, adult: Secondary | ICD-10-CM

## 2019-11-03 DIAGNOSIS — E1169 Type 2 diabetes mellitus with other specified complication: Secondary | ICD-10-CM | POA: Insufficient documentation

## 2019-11-03 LAB — GLUCOSE, POCT (MANUAL RESULT ENTRY): POC Glucose: 134 mg/dl — AB (ref 70–99)

## 2019-11-03 NOTE — Progress Notes (Signed)
Patient ID: Heather Bailey, female    DOB: 20-Feb-1961  MRN: [id number]  CC: Diabetes and Hypertension   Subjective: Heather Bailey is a 59 y.o. female who presents for chronic ds management Her concerns today include:  Pt with hx of HTN, DM, hypothyroidism, HL, depression, chronic LBP (on Gabapentin), dry skin.  HM: completed Pfizer COVID vaccine.  Has card today.  DIABETES TYPE 2/Obesity Last A1C:   Results for orders placed or performed in visit on 11/03/19  POCT glucose (manual entry)  Result Value Ref Range   POC Glucose 134 (A) 70 - 99 mg/dl    Lab Results  Component Value Date   HGBA1C 6.2 (H) 07/10/2019  Med Adherence:  [x]  Yes.  She is on Metformin    []  No Medication side effects:  []  Yes    [x]  No Home Monitoring?  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  Has been eating and laying down Exercise: not much exercise.  Down 5 lbs since 08/2019.  Goal is 179 lbs.  Hypoglycemic episodes?: []  Yes    [x]  No Numbness of the feet? []  Yes    [x]  No Retinopathy hx? []  Yes    []  No Last eye exam:  Did not do eye exam as yet.  Will have insurance soon with her new job. Comments:  HYPERTENSION Currently taking: see medication list.  On Metoprolol Med Adherence: [x]  Yes    []  No Medication side effects: [x]  Yes    []  No Adherence with salt restriction: [x]  Yes except she eats fries from fast food place about 2 x a wk   Home Monitoring?: [x]  Yes    []  No Monitoring Frequency: once a wk Home BP results range:  127/91 most recent.  SOB? []  Yes    [x]  No Chest Pain?: []  Yes    [x]  No Leg swelling?: []  Yes    [x]  No Headaches?: []  Yes    [x]  No Dizziness? []  Yes    [x]  No Comments:   HL: tolerating Lipitor Hypothyroid: Reports compliance with levothyroxine.  Last TSH done in February was normal. Patient Active Problem List   Diagnosis Date Noted  . Floaters in visual field, right 11/15/2018  . Onychomycosis  08/15/2018  . Achilles tendinitis of right lower extremity 08/15/2018  . Hot flashes 02/23/2017  . Chronic low back pain 04/12/2015  . Dry skin 04/12/2015  . Chronic dryness of both eyes 08/28/2014  . DM2 (diabetes mellitus, type 2) ([place]) 03/19/2014  . Hypothyroidism 03/19/2014  . Hypertension 03/19/2014  . Hyperlipidemia 03/19/2014  . Depression 03/19/2014     Current Outpatient Medications on File Prior to Visit  Medication Sig Dispense Refill  . atorvastatin (LIPITOR) 40 MG tablet Take 1 tablet (40 mg total) by mouth daily. 90 tablet 1  . cycloSPORINE (RESTASIS) 0.05 % ophthalmic emulsion Place 1 drop into both eyes 2 (two) times daily. (Patient not taking: Reported on 03/03/2019) 0.4 mL 11  . diclofenac sodium (VOLTAREN) 1 % GEL Apply 2 g topically 4 (four) times daily. 100 g 1  . gabapentin (NEURONTIN) 300 MG capsule Take 1 capsule (300 mg total) by mouth at bedtime. 90 capsule 2  . glucose blood (TRUE METRIX BLOOD GLUCOSE TEST) test strip Use as instructed to check blood sugar up to 3 times daily. 100 each 2  . levothyroxine (SYNTHROID) 50 MCG tablet TAKE 1 TABLET (  50 MCG TOTAL) BY MOUTH DAILY. 30 tablet 2  . metFORMIN (GLUCOPHAGE-XR) 500 MG 24 hr tablet TAKE 2 TABLETS BY MOUTH DAILY WITH BREAKFAST. 60 tablet 2  . metoprolol succinate (TOPROL-XL) 25 MG 24 hr tablet TAKE 1 TABLET (25 MG TOTAL) BY MOUTH DAILY. 30 tablet 2  . sertraline (ZOLOFT) 50 MG tablet TAKE 1.5 TABLETS (75 MG TOTAL) BY MOUTH DAILY. 45 tablet 2  . TRUEPLUS LANCETS 28G MISC Use to check blood sugar up to 3 times daily. 100 each 2   No current facility-administered medications on file prior to visit.    Allergies  Allergen Reactions  . Zithromax [Azithromycin Dihydrate]     Social History   Socioeconomic History  . Marital status: Married    Spouse name: Not on file  . Number of children: 2  . Years of education: Not on file  . Highest education level: Bachelor's degree (e.g., BA, AB, BS)  Occupational  History  . Not on file  Tobacco Use  . Smoking status: Never Smoker  . Smokeless tobacco: Never Used  Substance and Sexual Activity  . Alcohol use: No  . Drug use: No  . Sexual activity: Not Currently    Birth control/protection: Surgical  Other Topics Concern  . Not on file  Social History Narrative   Lives with husband who has diabetes.    Social Determinants of Health   Financial Resource Strain:   . Difficulty of Paying Living Expenses:   Food Insecurity:   . Worried About Charity fundraiser in the Last Year:   . Arboriculturist in the Last Year:   Transportation Needs: No Transportation Needs  . Lack of Transportation (Medical): No  . Lack of Transportation (Non-Medical): No  Physical Activity:   . Days of Exercise per Week:   . Minutes of Exercise per Session:   Stress:   . Feeling of Stress :   Social Connections:   . Frequency of Communication with Friends and Family:   . Frequency of Social Gatherings with Friends and Family:   . Attends Religious Services:   . Active Member of Clubs or Organizations:   . Attends Archivist Meetings:   Marland Kitchen Marital Status:   Intimate Partner Violence:   . Fear of Current or Ex-Partner:   . Emotionally Abused:   Marland Kitchen Physically Abused:   . Sexually Abused:     Family History  Problem Relation Age of Onset  . Hypertension Mother   . Cancer Mother        colon/cervical  . Alzheimer's disease Sister     Past Surgical History:  Procedure Laterality Date  . CESAREAN SECTION    . MYOMECTOMY    . TUBAL LIGATION      ROS: Review of Systems Negative except as stated above  PHYSICAL EXAM: BP 126/84   Pulse 83   Temp (!) 96.4 F (35.8 C)   Resp 16   Ht 5\' 6"  (1.676 m)   Wt 200 lb 6.4 oz (90.9 kg)   SpO2 97%   BMI 32.35 kg/m   Wt Readings from Last 3 Encounters:  11/03/19 200 lb 6.4 oz (90.9 kg)  09/05/19 205 lb (93 kg)  03/03/19 204 lb 9.6 oz (92.8 kg)    Physical Exam  General appearance - alert,  well appearing, and in no distress Mental status - normal mood, behavior, speech, dress, motor activity, and thought processes Mouth - mucous membranes moist, pharynx normal without lesions  Neck - supple, no significant adenopathy Chest - clear to auscultation, no wheezes, rales or rhonchi, symmetric air entry Extremities - peripheral pulses normal, no pedal edema, no clubbing or cyanosis Diabetic Foot Exam - Simple   Simple Foot Form Visual Inspection No deformities, no ulcerations, no other skin breakdown bilaterally: Yes Sensation Testing Intact to touch and monofilament testing bilaterally: Yes Pulse Check Posterior Tibialis and Dorsalis pulse intact bilaterally: Yes Comments      CMP Latest Ref Rng & Units 07/10/2019 10/05/2018 08/17/2018  Glucose 65 - 99 mg/dL 90 - 84  BUN 6 - 24 mg/dL 14 - 16  Creatinine 0.57 - 1.00 mg/dL 1.13(H) - 1.09(H)  Sodium 134 - 144 mmol/L 143 - 140  Potassium 3.5 - 5.2 mmol/L 5.2 - 4.5  Chloride 96 - 106 mmol/L 105 - 102  CO2 20 - 29 mmol/L 25 - 25  Calcium 8.7 - 10.2 mg/dL 10.4(H) - 10.3(H)  Total Protein 6.0 - 8.5 g/dL 7.4 7.9 7.5  Total Bilirubin 0.0 - 1.2 mg/dL 0.4 <0.2 0.2  Alkaline Phos 39 - 117 IU/L 90 75 78  AST 0 - 40 IU/L 19 18 20   ALT 0 - 32 IU/L 19 18 22    Lipid Panel     Component Value Date/Time   CHOL 175 07/10/2019 1359   TRIG 97 07/10/2019 1359   HDL 41 07/10/2019 1359   CHOLHDL 4.3 07/10/2019 1359   CHOLHDL 4.3 12/19/2015 1620   VLDL 20 12/19/2015 1620   LDLCALC 116 (H) 07/10/2019 1359    CBC    Component Value Date/Time   WBC 4.5 07/10/2019 1359   WBC 6.6 03/10/2013 2005   RBC 4.33 07/10/2019 1359   RBC 3.99 03/10/2013 2005   HGB 12.9 07/10/2019 1359   HCT 37.4 07/10/2019 1359   PLT 283 07/10/2019 1359   MCV 86 07/10/2019 1359   MCH 29.8 07/10/2019 1359   MCH 29.3 03/10/2013 2005   MCHC 34.5 07/10/2019 1359   MCHC 33.2 03/10/2013 2005   RDW 12.9 07/10/2019 1359    ASSESSMENT AND PLAN:  1. Type 2 diabetes  mellitus with obesity (HCC) At goal.  Continue Metformin.  Discussed on encourage healthy eating habits.  Encouraged her to get in some exercise on the 4 days when she does not work.  We discussed that she needs only 30 minutes of moderate intensity exercise most days. - POCT glucose (manual entry) - Microalbumin / creatinine urine ratio  2. Essential hypertension Close to goal.  Continue metoprolol  3. Hyperlipidemia associated with type 2 diabetes mellitus (HCC) Continue Lipitor  4. Class 1 obesity due to excess calories with serious comorbidity and body mass index (BMI) of 32.0 to 32.9 in adult See #1 above  5. Hypothyroidism, unspecified type Continue levothyroxine    Patient was given the opportunity to ask questions.  Patient verbalized understanding of the plan and was able to repeat key elements of the plan.   Orders Placed This Encounter  Procedures  . Microalbumin / creatinine urine ratio  . POCT glucose (manual entry)     Requested Prescriptions    No prescriptions requested or ordered in this encounter    Return in about 4 months (around 03/05/2020).  Karle Plumber, MD, FACP

## 2019-11-03 NOTE — Patient Instructions (Signed)

## 2019-11-07 DIAGNOSIS — E669 Obesity, unspecified: Secondary | ICD-10-CM | POA: Diagnosis not present

## 2019-11-07 DIAGNOSIS — E1169 Type 2 diabetes mellitus with other specified complication: Secondary | ICD-10-CM | POA: Diagnosis not present

## 2019-11-08 LAB — MICROALBUMIN / CREATININE URINE RATIO
Creatinine, Urine: 104.8 mg/dL
Microalb/Creat Ratio: <3 mg/g creat (ref 0–29)
Microalbumin, Urine: <3 ug/mL

## 2019-11-28 MED FILL — ?METOPROLOL SUCC ER 25MG TA: 25 | 30 days supply | Qty: 30 | Fill #2

## 2019-11-28 MED FILL — metFORMIN HCL ER 500 MG TB2: 500 | 30 days supply | Qty: 60 | Fill #2

## 2019-11-28 MED FILL — SERTRALINE HCL 50 MG TABLET: 50 | 30 days supply | Qty: 45 | Fill #2

## 2019-11-28 MED FILL — LEVOTHYROXINE SODIUM 50 MCG: 50 | 30 days supply | Qty: 30 | Fill #2

## 2019-11-28 MED FILL — ?ATORVASTATIN 40MG TABLET: 40 | 30 days supply | Qty: 30 | Fill #3

## 2019-12-25 MED FILL — ?ATORVASTATIN 40MG TABLET: 40 | 30 days supply | Qty: 30 | Fill #4

## 2019-12-28 ENCOUNTER — Other Ambulatory Visit: Payer: Self-pay | Admitting: Internal Medicine

## 2019-12-28 DIAGNOSIS — F32A Depression, unspecified: Secondary | ICD-10-CM

## 2019-12-28 DIAGNOSIS — I10 Essential (primary) hypertension: Secondary | ICD-10-CM

## 2019-12-28 DIAGNOSIS — E039 Hypothyroidism, unspecified: Secondary | ICD-10-CM

## 2019-12-28 DIAGNOSIS — E119 Type 2 diabetes mellitus without complications: Secondary | ICD-10-CM

## 2019-12-28 MED FILL — ?METOPROLOL SUCC ER 25MG TA: 25 | 30 days supply | Qty: 30 | Fill #0

## 2019-12-28 MED FILL — SERTRALINE HCL 50 MG TABLET: 50 | 30 days supply | Qty: 45 | Fill #0

## 2019-12-28 MED FILL — metFORMIN HCL ER 500 MG TB2: 500 | 30 days supply | Qty: 60 | Fill #0

## 2019-12-28 MED FILL — LEVOTHYROXINE SODIUM 50 MCG: 50 | 30 days supply | Qty: 30 | Fill #0

## 2020-01-29 MED FILL — metFORMIN HCL ER 500 MG TB2: 500 | 30 days supply | Qty: 60 | Fill #1

## 2020-01-29 MED FILL — LEVOTHYROXINE SODIUM 50 MCG: 50 | 30 days supply | Qty: 30 | Fill #1

## 2020-01-29 MED FILL — ?ATORVASTATIN 40MG TABLET: 40 | 30 days supply | Qty: 30 | Fill #5

## 2020-01-29 MED FILL — SERTRALINE HCL 50 MG TABLET: 50 | 30 days supply | Qty: 45 | Fill #1

## 2020-01-29 MED FILL — ?METOPROLOL SUCC ER 25MG TA: 25 | 30 days supply | Qty: 30 | Fill #1

## 2020-02-29 ENCOUNTER — Other Ambulatory Visit: Payer: Self-pay

## 2020-02-29 ENCOUNTER — Ambulatory Visit: Payer: BC Managed Care – PPO | Attending: Internal Medicine | Admitting: Internal Medicine

## 2020-02-29 ENCOUNTER — Other Ambulatory Visit: Payer: Self-pay | Admitting: Internal Medicine

## 2020-02-29 ENCOUNTER — Ambulatory Visit (HOSPITAL_BASED_OUTPATIENT_CLINIC_OR_DEPARTMENT_OTHER): Payer: BC Managed Care – PPO | Admitting: Pharmacist

## 2020-02-29 DIAGNOSIS — F329 Major depressive disorder, single episode, unspecified: Secondary | ICD-10-CM

## 2020-02-29 DIAGNOSIS — I10 Essential (primary) hypertension: Secondary | ICD-10-CM

## 2020-02-29 DIAGNOSIS — Z23 Encounter for immunization: Secondary | ICD-10-CM

## 2020-02-29 DIAGNOSIS — G8929 Other chronic pain: Secondary | ICD-10-CM

## 2020-02-29 DIAGNOSIS — E1169 Type 2 diabetes mellitus with other specified complication: Secondary | ICD-10-CM

## 2020-02-29 DIAGNOSIS — E119 Type 2 diabetes mellitus without complications: Secondary | ICD-10-CM

## 2020-02-29 DIAGNOSIS — F32A Depression, unspecified: Secondary | ICD-10-CM

## 2020-02-29 DIAGNOSIS — E669 Obesity, unspecified: Secondary | ICD-10-CM

## 2020-02-29 DIAGNOSIS — R232 Flushing: Secondary | ICD-10-CM

## 2020-02-29 DIAGNOSIS — F419 Anxiety disorder, unspecified: Secondary | ICD-10-CM | POA: Diagnosis not present

## 2020-02-29 DIAGNOSIS — M25571 Pain in right ankle and joints of right foot: Secondary | ICD-10-CM

## 2020-02-29 MED ORDER — SERTRALINE HCL 100 MG PO TABS: 100.0000 mg | ORAL_TABLET | Freq: Every day | ORAL | 2 refills | Status: DC

## 2020-02-29 MED FILL — ?METOPROLOL SUCC ER 25MG TA: 25 | 30 days supply | Qty: 30 | Fill #2

## 2020-02-29 MED FILL — SERTRALINE HCL 100 MG TAB: 100 | 30 days supply | Qty: 30 | Fill #0

## 2020-02-29 MED FILL — LEVOTHYROXINE SODIUM 50 MCG: 50 | 30 days supply | Qty: 30 | Fill #2

## 2020-02-29 MED FILL — metFORMIN HCL ER 500 MG TB2: 500 | 30 days supply | Qty: 60 | Fill #2

## 2020-02-29 MED FILL — ?ATORVASTATIN 40MG TABLET: 40 | 30 days supply | Qty: 30 | Fill #0

## 2020-02-29 NOTE — Progress Notes (Signed)
Patient presents for vaccination against influenza per orders of Dr. Johnson. Consent given. Counseling provided. No contraindications exists. Vaccine administered without incident.  ° °Luke Van Ausdall, PharmD, CPP °Clinical Pharmacist °[facility] °[phone] ° °

## 2020-02-29 NOTE — Progress Notes (Signed)
Virtual Visit via Telephone Note Due to current restrictions/limitations of in-office visits due to the COVID-19 pandemic, this scheduled clinical appointment was converted to a telehealth visit  I connected with Heather Bailey on 02/29/20 at 10:35 a.m by telephone and verified that I am speaking with the correct person using two identifiers. I am in my office.  The patient is at home.  Only the patient and myself participated in this encounter.  I discussed the limitations, risks, security and privacy concerns of performing an evaluation and management service by telephone and the availability of in person appointments. I also discussed with the patient that there may be a patient responsible charge related to this service. The patient expressed understanding and agreed to proceed.   History of Present Illness: Pt with hx of HTN, DM, hypothyroidism, HL, depression, chronic LBP (on Gabapentin), dry skin.  Last seen 10/2019.  She now has [employer].  DIABETES TYPE 2 Last A1C:   Results for orders placed or performed in visit on 11/03/19  Microalbumin / creatinine urine ratio  Result Value Ref Range   Creatinine, Urine 104.8 Not Estab. mg/dL   Microalbumin, Urine <3.0 Not Estab. ug/mL   Microalb/Creat Ratio <3 0 - 29 mg/g creat  POCT glucose (manual entry)  Result Value Ref Range   POC Glucose 134 (A) 70 - 99 mg/dl    Lab Results  Component Value Date   HGBA1C 6.2 (H) 07/10/2019  Med Adherence:  [x]  Yes  On Metformin Medication side effects:  []  Yes    []  No Home Monitoring?  [x]  Yes but just started doing so    Home glucose results range: BS this a.m before BF was 90 Diet Adherence: [x]  Yes -getting better and drinking more water Exercise: doing a lot of walking at work.  Does a little aerobics at home.  Plans to start going [facility] Hypoglycemic episodes?: []  Yes    []  No Numbness of the feet? []  Yes    [x]  No.  Plans to schedule with podiatry to figure  out what is happening with the achilles on the RT foot x 2 yrs.  A lot of pain and some swelling Retinopathy hx? []  Yes    [x]  No Last eye exam:  Having problems seeing things close up especially on the computer at work.  Made appt with ophthalmology practice but they can not get her in until 04/2020.  She plans to go to [employer] Comments:   HYPERTENSION Currently taking: see medication list.  She is on metoprolol Med Adherence: [x]  Yes    []  No Medication side effects: []  Yes    [x]  No Adherence with salt restriction: [x]  Yes    []  No Home Monitoring?: [x]  Yes    []  No Monitoring Frequency: once a wk Home BP results range:  BP 126/88 2 days ago SOB? [x]  Yes - a little when trying to get up from low sitting chairs at home    Chest Pain?: []  Yes    [x]  No Leg swelling?: []  Yes    [x]  No Headaches?: []  Yes    [x]  No Dizziness? []  Yes    [x]  No Comments:   C/o hot flashes.  She has been having them for yrs but worse over past several wks. Menses stopped in 2009.  When she gets hot flashes, it feels like her back is on fire.  She tries to keep the temperature in the house and  at work cool.  She also uses a fan. No unexplained wgh changes.  No palpitations.  She is compliant with taking levothyroxine. Gets anxious and frustrated because of feeling hot and some stresses with oldest son.  Having some feeling of angry. Little increase depression also.   Outpatient Encounter Medications as of 02/29/2020  Medication Sig  . atorvastatin (LIPITOR) 40 MG tablet Take 1 tablet (40 mg total) by mouth daily.  . cycloSPORINE (RESTASIS) 0.05 % ophthalmic emulsion Place 1 drop into both eyes 2 (two) times daily. (Patient not taking: Reported on 03/03/2019)  . diclofenac sodium (VOLTAREN) 1 % GEL Apply 2 g topically 4 (four) times daily.  Marland Kitchen gabapentin (NEURONTIN) 300 MG capsule Take 1 capsule (300 mg total) by mouth at bedtime.  Marland Kitchen glucose blood (TRUE METRIX BLOOD GLUCOSE TEST) test strip Use as  instructed to check blood sugar up to 3 times daily.  Marland Kitchen levothyroxine (SYNTHROID) 50 MCG tablet TAKE 1 TABLET (50 MCG TOTAL) BY MOUTH DAILY.  . metFORMIN (GLUCOPHAGE-XR) 500 MG 24 hr tablet TAKE 2 TABLETS BY MOUTH DAILY WITH BREAKFAST.  . metoprolol succinate (TOPROL-XL) 25 MG 24 hr tablet TAKE 1 TABLET (25 MG TOTAL) BY MOUTH DAILY.  Marland Kitchen sertraline (ZOLOFT) 50 MG tablet TAKE 1.5 TABLETS (75 MG TOTAL) BY MOUTH DAILY.  Marland Kitchen TRUEPLUS LANCETS 28G MISC Use to check blood sugar up to 3 times daily.   No facility-administered encounter medications on file as of 02/29/2020.      Observations/Objective: Results for orders placed or performed in visit on 11/03/19  Microalbumin / creatinine urine ratio  Result Value Ref Range   Creatinine, Urine 104.8 Not Estab. mg/dL   Microalbumin, Urine <3.0 Not Estab. ug/mL   Microalb/Creat Ratio <3 0 - 29 mg/g creat  POCT glucose (manual entry)  Result Value Ref Range   POC Glucose 134 (A) 70 - 99 mg/dl   Lab Results  Component Value Date   CHOL 175 07/10/2019   HDL 41 07/10/2019   LDLCALC 116 (H) 07/10/2019   TRIG 97 07/10/2019   CHOLHDL 4.3 07/10/2019     Chemistry      Component Value Date/Time   NA 143 07/10/2019 1359   K 5.2 07/10/2019 1359   CL 105 07/10/2019 1359   CO2 25 07/10/2019 1359   BUN 14 07/10/2019 1359   CREATININE 1.13 (H) 07/10/2019 1359   CREATININE 1.10 (H) 12/19/2015 1620      Component Value Date/Time   CALCIUM 10.4 (H) 07/10/2019 1359   ALKPHOS 90 07/10/2019 1359   AST 19 07/10/2019 1359   ALT 19 07/10/2019 1359   BILITOT 0.4 07/10/2019 1359     Lab Results  Component Value Date   TSH 4.010 07/18/2019     Assessment and Plan: 1. Type 2 diabetes mellitus with obesity ([place]) Hopefully her diabetes is still controlled given that she is taking the Metformin and feels she is doing better with her eating habits.  She does not have enough blood sugar readings to report at this time.  She is agreeable to coming to the lab  to have A1c checked.  Encouraged her to continue to try to eat healthy and exercise regularly. -She plans to go [facility] to get her eye exam done. - Hemoglobin A1c  2. Essential hypertension Reported diastolic blood pressure readings not at goal.  Advised patient that goal is 130/80 or lower.  Encouraged her to check the blood pressure once or twice a week and if  she finds that she is consistently not at goal she should let me know so that we can adjust the metoprolol or add another blood pressure medication.  3. Anxiety and depression We discussed whether she feels she needs a higher dose of the Zoloft.  She does but she is also concerned about weight gain.  I told her that the SSRIs can cause some weight gain usually around 5 pounds.  Usually does not cause 15 to 20 pound weight gain.  She is agreeable to increasing the dose from 75 mg daily to 100 mg daily. - sertraline (ZOLOFT) 100 MG tablet; Take 1 tablet (100 mg total) by mouth daily.  Dispense: 30 tablet; Refill: 2  4. Hot flashes We will check a thyroid level to make sure level is okay on current dose of levothyroxine.  We discussed ways to manage hot flashes including hormone replacement therapy and nonhormonal medications.  Went over risks and benefits of hormone replacement therapy.  Patient prefers not to have HRT.  I note that she is on gabapentin for back pain.  I told her that we sometimes use that to help control hot flashes.  Patient states she has not been taking the gabapentin regularly.  She only takes it when back pain is very severe.  She states she will try taking it regularly to see if it helps with the hot flashes. - TSH  5. Chronic pain of right ankle - Ambulatory referral to Podiatry  6. Need for influenza vaccination Patient will come later today to get her flu shot.   Follow Up Instructions: 4 mths   I discussed the assessment and treatment plan with the patient. The patient was provided an  opportunity to ask questions and all were answered. The patient agreed with the plan and demonstrated an understanding of the instructions.   The patient was advised to call back or seek an in-person evaluation if the symptoms worsen or if the condition fails to improve as anticipated.  I provided 22 minutes of non-face-to-face time during this encounter.   Karle Plumber, MD

## 2020-02-29 NOTE — Progress Notes (Signed)
Pt states her blood sugar was 90 before breakfast

## 2020-03-01 LAB — TSH: TSH: 6.82 u[IU]/mL — ABNORMAL HIGH (ref 0.450–4.500)

## 2020-03-01 LAB — HEMOGLOBIN A1C
Est. average glucose Bld gHb Est-mCnc: 140 mg/dL
Hgb A1c MFr Bld: 6.5 % — ABNORMAL HIGH (ref 4.8–5.6)

## 2020-03-04 ENCOUNTER — Encounter: Payer: Self-pay | Admitting: Internal Medicine

## 2020-03-06 MED FILL — metFORMIN HCL ER 500 MG TB2: 500 | 30 days supply | Qty: 60 | Fill #2

## 2020-03-06 MED FILL — ATORVASTATIN 40 MG TABLET: 40 | 30 days supply | Qty: 30 | Fill #0

## 2020-03-21 ENCOUNTER — Encounter: Payer: Self-pay | Admitting: Podiatry

## 2020-03-21 ENCOUNTER — Other Ambulatory Visit: Payer: Self-pay

## 2020-03-21 ENCOUNTER — Ambulatory Visit (INDEPENDENT_AMBULATORY_CARE_PROVIDER_SITE_OTHER): Payer: BC Managed Care – PPO

## 2020-03-21 ENCOUNTER — Ambulatory Visit (INDEPENDENT_AMBULATORY_CARE_PROVIDER_SITE_OTHER): Payer: BC Managed Care – PPO | Admitting: Podiatry

## 2020-03-21 DIAGNOSIS — M7661 Achilles tendinitis, right leg: Secondary | ICD-10-CM

## 2020-03-21 DIAGNOSIS — R52 Pain, unspecified: Secondary | ICD-10-CM

## 2020-03-21 NOTE — Patient Instructions (Signed)

## 2020-03-23 NOTE — Progress Notes (Signed)
Subjective:   Patient ID: Heather Bailey, female   DOB: 59 y.o.   MRN: [id number]   HPI Patient states that she is got a lot of pain in her right ankle and its been present for several months and gradually becoming more of an issue.  She did have vein testing which was negative and also does not smoke likes to be active   Review of Systems  All other systems reviewed and are negative.       Objective:  Physical Exam Vitals and nursing note reviewed.  Constitutional:      Appearance: She is well-developed.  Pulmonary:     Effort: Pulmonary effort is normal.  Musculoskeletal:        General: Normal range of motion.  Skin:    General: Skin is warm.  Neurological:     Mental Status: She is alert.     Neurovascular status found to be intact muscle strength was found to be adequate range of motion is adequate.  Patient does have exquisite discomfort in the posterior medial aspect of the right heel at the insertion Achilles with no central lateral tendon involvement and has good range of motion and mild equinus condition.  Patient is found to have good digital perfusion at the current time and well oriented     Assessment:  Acute Achilles tendinitis right with inflammation fluid around the medial side with a central lateral band not involved currently     Plan:  H&P reviewed x-rays and spur formation that is present and discussed condition with patient.  At this point I did discuss injection and I explained chances of rupture and she wants to go ahead and get this done understanding risk and today I went ahead and I did sterile prep injected the medial side Achilles 3 mg dexamethasone Kenalog 5 mg Xylocaine and advised patient on taking it easy and applied air fracture walker that I want her to try to use as much as she can.  We discussed more aggressive treatment options depending on response  X-rays indicated large spurring of the posterior right heel negative on the left and it is  involving the entire back of the calcaneus

## 2020-04-01 ENCOUNTER — Other Ambulatory Visit: Payer: Self-pay | Admitting: Internal Medicine

## 2020-04-01 DIAGNOSIS — M545 Low back pain, unspecified: Secondary | ICD-10-CM

## 2020-04-01 MED FILL — GABAPENTIN 300 MG CAPSULE: 300 | 30 days supply | Qty: 30 | Fill #0

## 2020-04-04 ENCOUNTER — Other Ambulatory Visit: Payer: Self-pay | Admitting: Internal Medicine

## 2020-04-04 DIAGNOSIS — I5032 Chronic diastolic (congestive) heart failure: Secondary | ICD-10-CM | POA: Diagnosis not present

## 2020-04-04 DIAGNOSIS — I2721 Secondary pulmonary arterial hypertension: Secondary | ICD-10-CM | POA: Diagnosis not present

## 2020-04-04 DIAGNOSIS — I10 Essential (primary) hypertension: Secondary | ICD-10-CM

## 2020-04-04 DIAGNOSIS — E039 Hypothyroidism, unspecified: Secondary | ICD-10-CM

## 2020-04-04 DIAGNOSIS — I50812 Chronic right heart failure: Secondary | ICD-10-CM | POA: Diagnosis not present

## 2020-04-04 DIAGNOSIS — E119 Type 2 diabetes mellitus without complications: Secondary | ICD-10-CM

## 2020-04-04 MED FILL — METOPROLOL SUCCINATE ER 25: 25 | 30 days supply | Qty: 30 | Fill #0

## 2020-04-04 MED FILL — LEVOTHYROXINE SODIUM 50 MCG: 50 | 30 days supply | Qty: 30 | Fill #0

## 2020-04-04 MED FILL — SERTRALINE HCL 100 MG TAB: 100 | 30 days supply | Qty: 30 | Fill #1

## 2020-04-04 MED FILL — ATORVASTATIN 40 MG TABLET: 40 | 30 days supply | Qty: 30 | Fill #1

## 2020-04-04 MED FILL — metFORMIN HCL ER 500 MG TB2: 500 | 30 days supply | Qty: 60 | Fill #0

## 2020-04-18 ENCOUNTER — Encounter: Payer: Self-pay | Admitting: Podiatry

## 2020-04-18 ENCOUNTER — Ambulatory Visit (INDEPENDENT_AMBULATORY_CARE_PROVIDER_SITE_OTHER): Payer: BC Managed Care – PPO | Admitting: Podiatry

## 2020-04-18 ENCOUNTER — Other Ambulatory Visit: Payer: Self-pay

## 2020-04-18 DIAGNOSIS — M7661 Achilles tendinitis, right leg: Secondary | ICD-10-CM

## 2020-04-18 NOTE — Progress Notes (Signed)
Subjective:   Patient ID: Heather Bailey, female   DOB: 59 y.o.   MRN: [id number]   HPI Patient states the heel is improving quite a bit and the pain has receded and I have still been wearing my boot almost all the time   ROS      Objective:  Physical Exam  Neurovascular status intact with patient's posterior medial heel improved quite dramatically with no current edema erythema noted and good range of motion doing well     Assessment:  Doing well post treatment for Achilles tendinitis with medication immobilization     Plan:  Reviewed the continuation of conservative care anti-inflammatories ice therapy physical therapy and immobilization which I want to reduce in the next several weeks.  Patient will be seen back for [place] to recheck and is encouraged to call with questions concerns

## 2020-04-24 LAB — HM DIABETES EYE EXAM

## 2020-04-25 DIAGNOSIS — M24521 Contracture, right elbow: Secondary | ICD-10-CM | POA: Diagnosis not present

## 2020-04-25 DIAGNOSIS — R768 Other specified abnormal immunological findings in serum: Secondary | ICD-10-CM | POA: Diagnosis not present

## 2020-04-25 DIAGNOSIS — M5030 Other cervical disc degeneration, unspecified cervical region: Secondary | ICD-10-CM | POA: Diagnosis not present

## 2020-04-25 DIAGNOSIS — M1611 Unilateral primary osteoarthritis, right hip: Secondary | ICD-10-CM | POA: Diagnosis not present

## 2020-05-01 ENCOUNTER — Encounter: Payer: Self-pay | Admitting: Internal Medicine

## 2020-05-01 ENCOUNTER — Other Ambulatory Visit: Payer: Self-pay | Admitting: Internal Medicine

## 2020-05-01 DIAGNOSIS — E119 Type 2 diabetes mellitus without complications: Secondary | ICD-10-CM

## 2020-05-01 MED ORDER — ATORVASTATIN CALCIUM 40 MG PO TABS: 40.0000 mg | ORAL_TABLET | Freq: Every day | ORAL | 1 refills | Status: DC

## 2020-05-01 NOTE — Telephone Encounter (Signed)
atorvastatin (LIPITOR) 40 MG tablet Medication Date: 02/29/2020 Department: [place] Ordering/Authorizing: Ladell Pier, MD  Order Providers  [place], [place] Phone:  [phone]  Fax:  [phone]

## 2020-05-03 ENCOUNTER — Other Ambulatory Visit: Payer: Self-pay

## 2020-05-03 ENCOUNTER — Encounter: Payer: Self-pay | Admitting: Internal Medicine

## 2020-05-03 ENCOUNTER — Ambulatory Visit: Payer: BC Managed Care – PPO | Attending: Internal Medicine | Admitting: Internal Medicine

## 2020-05-03 VITALS — BP 134/86 | HR 75 | Resp 16 | Ht 66.0 in | Wt 198.2 lb

## 2020-05-03 DIAGNOSIS — E039 Hypothyroidism, unspecified: Secondary | ICD-10-CM | POA: Diagnosis not present

## 2020-05-03 DIAGNOSIS — I10 Essential (primary) hypertension: Secondary | ICD-10-CM | POA: Diagnosis not present

## 2020-05-03 DIAGNOSIS — Z0000 Encounter for general adult medical examination without abnormal findings: Secondary | ICD-10-CM | POA: Diagnosis not present

## 2020-05-03 DIAGNOSIS — M545 Low back pain, unspecified: Secondary | ICD-10-CM

## 2020-05-03 DIAGNOSIS — R252 Cramp and spasm: Secondary | ICD-10-CM

## 2020-05-03 DIAGNOSIS — E669 Obesity, unspecified: Secondary | ICD-10-CM | POA: Diagnosis not present

## 2020-05-03 DIAGNOSIS — G8929 Other chronic pain: Secondary | ICD-10-CM

## 2020-05-03 DIAGNOSIS — F32A Depression, unspecified: Secondary | ICD-10-CM

## 2020-05-03 DIAGNOSIS — F419 Anxiety disorder, unspecified: Secondary | ICD-10-CM

## 2020-05-03 DIAGNOSIS — E1169 Type 2 diabetes mellitus with other specified complication: Secondary | ICD-10-CM | POA: Diagnosis not present

## 2020-05-03 DIAGNOSIS — Z125 Encounter for screening for malignant neoplasm of prostate: Secondary | ICD-10-CM | POA: Diagnosis not present

## 2020-05-03 DIAGNOSIS — E785 Hyperlipidemia, unspecified: Secondary | ICD-10-CM

## 2020-05-03 LAB — GLUCOSE, POCT (MANUAL RESULT ENTRY): POC Glucose: 99 mg/dl (ref 70–99)

## 2020-05-03 MED ORDER — METOPROLOL SUCCINATE ER 25 MG PO TB24: 25.0000 mg | ORAL_TABLET | Freq: Every day | ORAL | 3 refills | Status: DC

## 2020-05-03 MED ORDER — SERTRALINE HCL 100 MG PO TABS: 100.0000 mg | ORAL_TABLET | Freq: Every day | ORAL | 2 refills | Status: DC

## 2020-05-03 MED ORDER — ATORVASTATIN CALCIUM 40 MG PO TABS: 20.0000 mg | ORAL_TABLET | Freq: Every day | ORAL | 3 refills | Status: DC

## 2020-05-03 MED ORDER — GABAPENTIN 300 MG PO CAPS: 300.0000 mg | ORAL_CAPSULE | Freq: Every day | ORAL | 3 refills | Status: DC

## 2020-05-03 MED ORDER — LOSARTAN POTASSIUM 25 MG PO TABS: 12.5000 mg | ORAL_TABLET | Freq: Every day | ORAL | 3 refills | Status: DC

## 2020-05-03 MED ORDER — METFORMIN HCL ER 500 MG PO TB24: 1000.0000 mg | ORAL_TABLET | Freq: Every day | ORAL | 3 refills | Status: DC

## 2020-05-03 NOTE — Patient Instructions (Signed)
Decrease atorvastatin to half a tablet daily.  Let me know if the cramps persist. I have sent refills on all of your medications except with the levothyroxine.  I will wait until I get the results back of your thyroid level before sending the refill just in case we need to change the dose. We have added a new blood pressure medication called Cozaar 25 mg.  You will take half a tablet daily.  Continue to monitor your blood pressure at home.   Healthy Eating Following a healthy eating pattern may help you to achieve and maintain a healthy body weight, reduce the risk of chronic disease, and live a long and productive life. It is important to follow a healthy eating pattern at an appropriate calorie level for your body. Your nutritional needs should be met primarily through food by choosing a variety of nutrient-rich foods. What are tips for following this plan? Reading food labels  Read labels and choose the following: ? Reduced or low sodium. ? Juices with 100% fruit juice. ? Foods with low saturated fats and high polyunsaturated and monounsaturated fats. ? Foods with whole grains, such as whole wheat, cracked wheat, brown rice, and wild rice. ? Whole grains that are fortified with folic acid. This is recommended for women who are pregnant or who want to become pregnant.  Read labels and avoid the following: ? Foods with a lot of added sugars. These include foods that contain brown sugar, corn sweetener, corn syrup, dextrose, fructose, glucose, high-fructose corn syrup, honey, invert sugar, lactose, malt syrup, maltose, molasses, raw sugar, sucrose, trehalose, or turbinado sugar.  Do not eat more than the following amounts of added sugar per day:  6 teaspoons (25 g) for women.  9 teaspoons (38 g) for men. ? Foods that contain processed or refined starches and grains. ? Refined grain products, such as white flour, degermed cornmeal, white bread, and white rice. Shopping  Choose  nutrient-rich snacks, such as vegetables, whole fruits, and nuts. Avoid high-calorie and high-sugar snacks, such as potato chips, fruit snacks, and candy.  Use oil-based dressings and spreads on foods instead of solid fats such as butter, stick margarine, or cream cheese.  Limit pre-made sauces, mixes, and "instant" products such as flavored rice, instant noodles, and ready-made pasta.  Try more plant-protein sources, such as tofu, tempeh, black beans, edamame, lentils, nuts, and seeds.  Explore eating plans such as the Mediterranean diet or vegetarian diet. Cooking  Use oil to saut or stir-fry foods instead of solid fats such as butter, stick margarine, or lard.  Try baking, boiling, grilling, or broiling instead of frying.  Remove the fatty part of meats before cooking.  Steam vegetables in water or broth. Meal planning   At meals, imagine dividing your plate into fourths: ? One-half of your plate is fruits and vegetables. ? One-fourth of your plate is whole grains. ? One-fourth of your plate is protein, especially lean meats, poultry, eggs, tofu, beans, or nuts.  Include low-fat dairy as part of your daily diet. Lifestyle  Choose healthy options in all settings, including home, work, school, restaurants, or stores.  Prepare your food safely: ? Wash your hands after handling raw meats. ? Keep food preparation surfaces clean by regularly washing with hot, soapy water. ? Keep raw meats separate from ready-to-eat foods, such as fruits and vegetables. ? Cook seafood, meat, poultry, and eggs to the recommended internal temperature. ? Store foods at safe temperatures. In general:  Keep cold foods at  71F (4.4C) or below.  Keep hot foods at 171F (60C) or above.  Keep your freezer at [facility] (-17.8C) or below.  Foods are no longer safe to eat when they have been between the temperatures of 40-171F (4.4-60C) for more than 2 hours. What foods should I eat? Fruits Aim to eat  2 cup-equivalents of fresh, canned (in natural juice), or frozen fruits each day. Examples of 1 cup-equivalent of fruit include 1 small apple, 8 large strawberries, 1 cup canned fruit,  cup dried fruit, or 1 cup 100% juice. Vegetables Aim to eat 2-3 cup-equivalents of fresh and frozen vegetables each day, including different varieties and colors. Examples of 1 cup-equivalent of vegetables include 2 medium carrots, 2 cups raw, leafy greens, 1 cup chopped vegetable (raw or cooked), or 1 medium baked potato. Grains Aim to eat 6 ounce-equivalents of whole grains each day. Examples of 1 ounce-equivalent of grains include 1 slice of bread, 1 cup ready-to-eat cereal, 3 cups popcorn, or  cup cooked rice, pasta, or cereal. Meats and other proteins Aim to eat 5-6 ounce-equivalents of protein each day. Examples of 1 ounce-equivalent of protein include 1 egg, 1/2 cup nuts or seeds, or 1 tablespoon (16 g) peanut butter. A cut of meat or fish that is the size of a deck of cards is about 3-4 ounce-equivalents.  Of the protein you eat each week, try to have at least 8 ounces come from seafood. This includes salmon, trout, herring, and anchovies. Dairy Aim to eat 3 cup-equivalents of fat-free or low-fat dairy each day. Examples of 1 cup-equivalent of dairy include 1 cup (240 mL) milk, 8 ounces (250 g) yogurt, 1 ounces (44 g) natural cheese, or 1 cup (240 mL) fortified soy milk. Fats and oils  Aim for about 5 teaspoons (21 g) per day. Choose monounsaturated fats, such as canola and olive oils, avocados, peanut butter, and most nuts, or polyunsaturated fats, such as sunflower, corn, and soybean oils, walnuts, pine nuts, sesame seeds, sunflower seeds, and flaxseed. Beverages  Aim for six 8-oz glasses of water per day. Limit coffee to three to five 8-oz cups per day.  Limit caffeinated beverages that have added calories, such as soda and energy drinks.  Limit alcohol intake to no more than 1 drink a day for  nonpregnant women and 2 drinks a day for men. One drink equals 12 oz of beer (355 mL), 5 oz of wine (148 mL), or 1 oz of hard liquor (44 mL). Seasoning and other foods  Avoid adding excess amounts of salt to your foods. Try flavoring foods with herbs and spices instead of salt.  Avoid adding sugar to foods.  Try using oil-based dressings, sauces, and spreads instead of solid fats. This information is based on general [place] nutrition guidelines. For more information, visit [employer]es. Exact amounts may vary based on your nutrition needs. Summary  A healthy eating plan may help you to maintain a healthy weight, reduce the risk of chronic diseases, and stay active throughout your life.  Plan your meals. Make sure you eat the right portions of a variety of nutrient-rich foods.  Try baking, boiling, grilling, or broiling instead of frying.  Choose healthy options in all settings, including home, work, school, restaurants, or stores. This information is not intended to replace advice given to you by your health care provider. Make sure you discuss any questions you have with your health care provider. Document Revised: 09/13/2017 Document Reviewed: 09/13/2017 Elsevier Patient Education  [place].

## 2020-05-03 NOTE — Progress Notes (Signed)
Patient ID: Heather Bailey, female    DOB: 1960-08-04  MRN: [id number]  CC: Annual Exam   Subjective: Heather Bailey is a 59 y.o. female who presents for annual exam.  Husband is with her.  Her concerns today include:  Pt with hx of HTN, DM, hypothyroidism, HL, depression, chronic LBP (on Gabapentin), dry skin.   HM:  Declines shingles vaccine today  DM/Obesity: checking BS but not regularly.  Just found her new meter and plans to start checking Does well with eating habits on days that she works.  Trying to cut back on fast foods Doing a lot of walking at work and does stretches at home.   C/o having a lot of cramps in both legs at nights for past few wks. Cramps sometimes during the day. She is on Lipitor 40 mg daily.  Hypothyroid: TSH level was mildly elevated in 02/2020.  She was taking it at the same time that she took her other meds but has started taking first thing in the mornings by itself. We plan to recheck levels today  Depression/Anxiety: doing good on Zoloft  Blood pressure today mildly elevated.  She reports compliance with taking metoprolol.  She checks blood pressure at home.  Reports that systolic blood pressure has been in the 892J and diastolic blood pressure in the 80s.  Patient Active Problem List   Diagnosis Date Noted  . Hyperlipidemia associated with type 2 diabetes mellitus ([place]) 11/03/2019  . Class 1 obesity due to excess calories with serious comorbidity and body mass index (BMI) of 32.0 to 32.9 in adult 11/03/2019  . Floaters in visual field, right 11/15/2018  . Onychomycosis 08/15/2018  . Achilles tendinitis of right lower extremity 08/15/2018  . Hot flashes 02/23/2017  . Chronic low back pain 04/12/2015  . Dry skin 04/12/2015  . Chronic dryness of both eyes 08/28/2014  . DM2 (diabetes mellitus, type 2) ([place]) 03/19/2014  . Hypothyroidism 03/19/2014  . Hypertension 03/19/2014  . Hyperlipidemia 03/19/2014  . Depression 03/19/2014     Current  Outpatient Medications on File Prior to Visit  Medication Sig Dispense Refill  . diclofenac sodium (VOLTAREN) 1 % GEL Apply 2 g topically 4 (four) times daily. 100 g 1  . glucose blood (TRUE METRIX BLOOD GLUCOSE TEST) test strip Use as instructed to check blood sugar up to 3 times daily. 100 each 2  . levothyroxine (SYNTHROID) 50 MCG tablet TAKE 1 TABLET (50 MCG TOTAL) BY MOUTH DAILY. 30 tablet 2  . TRUEPLUS LANCETS 28G MISC Use to check blood sugar up to 3 times daily. 100 each 2   No current facility-administered medications on file prior to visit.    Allergies  Allergen Reactions  . Zithromax [Azithromycin Dihydrate]     Social History   Socioeconomic History  . Marital status: Married    Spouse name: Not on file  . Number of children: 2  . Years of education: Not on file  . Highest education level: Bachelor's degree (e.g., BA, AB, BS)  Occupational History  . Not on file  Tobacco Use  . Smoking status: Never Smoker  . Smokeless tobacco: Never Used  Vaping Use  . Vaping Use: Never used  Substance and Sexual Activity  . Alcohol use: No  . Drug use: No  . Sexual activity: Not Currently    Birth control/protection: Surgical  Other Topics Concern  . Not on file  Social History Narrative   Lives with husband who has diabetes.  Social Determinants of Health   Financial Resource Strain:   . Difficulty of Paying Living Expenses: Not on file  Food Insecurity:   . Worried About Charity fundraiser in the Last Year: Not on file  . Ran Out of Food in the Last Year: Not on file  Transportation Needs: No Transportation Needs  . Lack of Transportation (Medical): No  . Lack of Transportation (Non-Medical): No  Physical Activity:   . Days of Exercise per Week: Not on file  . Minutes of Exercise per Session: Not on file  Stress:   . Feeling of Stress : Not on file  Social Connections:   . Frequency of Communication with Friends and Family: Not on file  . Frequency of  Social Gatherings with Friends and Family: Not on file  . Attends Religious Services: Not on file  . Active Member of Clubs or Organizations: Not on file  . Attends Archivist Meetings: Not on file  . Marital Status: Not on file  Intimate Partner Violence:   . Fear of Current or Ex-Partner: Not on file  . Emotionally Abused: Not on file  . Physically Abused: Not on file  . Sexually Abused: Not on file    Family History  Problem Relation Age of Onset  . Hypertension Mother   . Cancer Mother        colon/cervical  . Alzheimer's disease Sister     Past Surgical History:  Procedure Laterality Date  . CESAREAN SECTION    . MYOMECTOMY    . TUBAL LIGATION      ROS: Review of Systems  Constitutional: Negative for activity change and fever.  HENT: Negative for congestion, hearing loss and sore throat.   Eyes:       Had recent vision exam and was prescribed reading glasses.  Respiratory: Negative for chest tightness and shortness of breath.   Cardiovascular: Negative for chest pain and palpitations.  Gastrointestinal: Negative for abdominal pain and blood in stool.  Genitourinary: Negative for difficulty urinating.  Psychiatric/Behavioral: Negative for dysphoric mood.     PHYSICAL EXAM: BP 134/86   Pulse 75   Resp 16   Ht 5\' 6"  (1.676 m)   Wt 198 lb 3.2 oz (89.9 kg)   SpO2 97%   BMI 31.99 kg/m   Wt Readings from Last 3 Encounters:  05/03/20 198 lb 3.2 oz (89.9 kg)  11/03/19 200 lb 6.4 oz (90.9 kg)  09/05/19 205 lb (93 kg)    Physical Exam  General appearance - alert, well appearing, older African-American female and in no distress Mental status - normal mood, behavior, speech, dress, motor activity, and thought processes Eyes - pupils equal and reactive, extraocular eye movements intact Ears - bilateral TM's and external ear canals normal Nose - normal and patent, no erythema, discharge or polyps Mouth -she has partials above.  Good oral hygiene.   Neck  - supple, no significant adenopathy Lymphatics - no palpable lymphadenopathy, no hepatosplenomegaly Chest - clear to auscultation, no wheezes, rales or rhonchi, symmetric air entry Heart - normal rate, regular rhythm, normal S1, S2, no murmurs, rubs, clicks or gallops Abdomen - soft, nontender, nondistended, no masses or organomegaly Musculoskeletal - no joint tenderness, deformity or swelling Extremities - peripheral pulses normal (dorsalis pedis pulses, posterior tibialis and popliteal pulses in the lower extremities), no pedal edema, no clubbing or cyanosis Diabetic Foot Exam - Simple   Simple Foot Form Visual Inspection No deformities, no ulcerations, no other skin  breakdown bilaterally: Yes Sensation Testing Intact to touch and monofilament testing bilaterally: Yes Pulse Check Posterior Tibialis and Dorsalis pulse intact bilaterally: Yes Comments      CMP Latest Ref Rng & Units 07/10/2019 10/05/2018 08/17/2018  Glucose 65 - 99 mg/dL 90 - 84  BUN 6 - 24 mg/dL 14 - 16  Creatinine 0.57 - 1.00 mg/dL 1.13(H) - 1.09(H)  Sodium 134 - 144 mmol/L 143 - 140  Potassium 3.5 - 5.2 mmol/L 5.2 - 4.5  Chloride 96 - 106 mmol/L 105 - 102  CO2 20 - 29 mmol/L 25 - 25  Calcium 8.7 - 10.2 mg/dL 10.4(H) - 10.3(H)  Total Protein 6.0 - 8.5 g/dL 7.4 7.9 7.5  Total Bilirubin 0.0 - 1.2 mg/dL 0.4 <0.2 0.2  Alkaline Phos 39 - 117 IU/L 90 75 78  AST 0 - 40 IU/L 19 18 20   ALT 0 - 32 IU/L 19 18 22    Lipid Panel     Component Value Date/Time   CHOL 175 07/10/2019 1359   TRIG 97 07/10/2019 1359   HDL 41 07/10/2019 1359   CHOLHDL 4.3 07/10/2019 1359   CHOLHDL 4.3 12/19/2015 1620   VLDL 20 12/19/2015 1620   LDLCALC 116 (H) 07/10/2019 1359    CBC    Component Value Date/Time   WBC 4.5 07/10/2019 1359   WBC 6.6 03/10/2013 2005   RBC 4.33 07/10/2019 1359   RBC 3.99 03/10/2013 2005   HGB 12.9 07/10/2019 1359   HCT 37.4 07/10/2019 1359   PLT 283 07/10/2019 1359   MCV 86 07/10/2019 1359   MCH 29.8  07/10/2019 1359   MCH 29.3 03/10/2013 2005   MCHC 34.5 07/10/2019 1359   MCHC 33.2 03/10/2013 2005   RDW 12.9 07/10/2019 1359   Lab Results  Component Value Date   HGBA1C 6.5 (H) 02/29/2020   Lab Results  Component Value Date   TSH 6.820 (H) 02/29/2020     ASSESSMENT AND PLAN: 1. Annual physical exam Patient declines shingles vaccine today. Commended her on improving on her eating habits.  Encouraged her to be mindful of portion sizes over the upcoming holidays.  Encouraged her to move as much as she can.  2. Type 2 diabetes mellitus with obesity ([place]) Continue Metformin, healthy eating habits and trying to move more. - POCT glucose (manual entry) - metFORMIN (GLUCOPHAGE-XR) 500 MG 24 hr tablet; Take 2 tablets (1,000 mg total) by mouth daily.  Dispense: 180 tablet; Refill: 3 - CBC - Comprehensive metabolic panel - Lipid panel - atorvastatin (LIPITOR) 40 MG tablet; Take 0.5 tablets (20 mg total) by mouth daily.  Dispense: 45 tablet; Refill: 3  3. Chronic low back pain Patient requested 90-day refills on all of her medications including the gabapentin - gabapentin (NEURONTIN) 300 MG capsule; Take 1 capsule (300 mg total) by mouth at bedtime.  Dispense: 90 capsule; Refill: 3  4. Essential hypertension Not at goal.  Continue metoprolol.  We have added low-dose of Cozaar.  Advised to continue to check blood pressure with goal being 130/80 or lower. - losartan (COZAAR) 25 MG tablet; Take 0.5 tablets (12.5 mg total) by mouth daily.  Dispense: 45 tablet; Refill: 3 - metoprolol succinate (TOPROL-XL) 25 MG 24 hr tablet; Take 1 tablet (25 mg total) by mouth daily.  Dispense: 90 tablet; Refill: 3  5. Hypothyroidism, unspecified type Continue levothyroxine. - TSH  6. Hyperlipidemia associated with type 2 diabetes mellitus (HCC) 7. Leg cramps I think her nocturnal leg cramps are likely due  to atorvastatin.  Currently on 40 mg daily.  Advised to cut the pill in half to 20 mg.  She will  let me know via MyChart if the cramps continue.  8. Anxiety and depression Clinically stable on current dose of Zoloft. - sertraline (ZOLOFT) 100 MG tablet; Take 1 tablet (100 mg total) by mouth daily.  Dispense: 90 tablet; Refill: 2     Patient was given the opportunity to ask questions.  Patient verbalized understanding of the plan and was able to repeat key elements of the plan.   Orders Placed This Encounter  Procedures  . CBC  . Comprehensive metabolic panel  . Lipid panel  . TSH  . POCT glucose (manual entry)     Requested Prescriptions   Signed Prescriptions Disp Refills  . metFORMIN (GLUCOPHAGE-XR) 500 MG 24 hr tablet 180 tablet 3    Sig: Take 2 tablets (1,000 mg total) by mouth daily.  Marland Kitchen gabapentin (NEURONTIN) 300 MG capsule 90 capsule 3    Sig: Take 1 capsule (300 mg total) by mouth at bedtime.  Marland Kitchen losartan (COZAAR) 25 MG tablet 45 tablet 3    Sig: Take 0.5 tablets (12.5 mg total) by mouth daily.  . metoprolol succinate (TOPROL-XL) 25 MG 24 hr tablet 90 tablet 3    Sig: Take 1 tablet (25 mg total) by mouth daily.  . sertraline (ZOLOFT) 100 MG tablet 90 tablet 2    Sig: Take 1 tablet (100 mg total) by mouth daily.  Marland Kitchen atorvastatin (LIPITOR) 40 MG tablet 45 tablet 3    Sig: Take 0.5 tablets (20 mg total) by mouth daily.    Return in about 4 months (around 08/31/2020).  Karle Plumber, MD, FACP

## 2020-05-04 LAB — COMPREHENSIVE METABOLIC PANEL
ALT: 17 IU/L (ref 0–32)
AST: 15 IU/L (ref 0–40)
Albumin/Globulin Ratio: 1.6 (ref 1.2–2.2)
Albumin: 4.9 g/dL (ref 3.8–4.9)
Alkaline Phosphatase: 96 IU/L (ref 44–121)
BUN/Creatinine Ratio: 15 (ref 9–23)
BUN: 17 mg/dL (ref 6–24)
Bilirubin Total: 0.4 mg/dL (ref 0.0–1.2)
CO2: 25 mmol/L (ref 20–29)
Calcium: 10.3 mg/dL — ABNORMAL HIGH (ref 8.7–10.2)
Chloride: 103 mmol/L (ref 96–106)
Creatinine, Ser: 1.15 mg/dL — ABNORMAL HIGH (ref 0.57–1.00)
GFR calc Af Amer: 60 mL/min/{1.73_m2} (ref >59)
GFR calc non Af Amer: 52 mL/min/{1.73_m2} — ABNORMAL LOW (ref >59)
Globulin, Total: 3.1 g/dL (ref 1.5–4.5)
Glucose: 105 mg/dL — ABNORMAL HIGH (ref 65–99)
Potassium: 4.5 mmol/L (ref 3.5–5.2)
Sodium: 140 mmol/L (ref 134–144)
Total Protein: 8 g/dL (ref 6.0–8.5)

## 2020-05-04 LAB — CBC
Hematocrit: 40.2 % (ref 34.0–46.6)
Hemoglobin: 13 g/dL (ref 11.1–15.9)
MCH: 28.3 pg (ref 26.6–33.0)
MCHC: 32.3 g/dL (ref 31.5–35.7)
MCV: 88 fL (ref 79–97)
Platelets: 321 10*3/uL (ref 150–450)
RBC: 4.59 x10E6/uL (ref 3.77–5.28)
RDW: 13.1 % (ref 11.7–15.4)
WBC: 4.9 10*3/uL (ref 3.4–10.8)

## 2020-05-04 LAB — LIPID PANEL
Chol/HDL Ratio: 4 ratio (ref 0.0–4.4)
Cholesterol, Total: 184 mg/dL (ref 100–199)
HDL: 46 mg/dL (ref >39)
LDL Chol Calc (NIH): 118 mg/dL — ABNORMAL HIGH (ref 0–99)
Triglycerides: 111 mg/dL (ref 0–149)
VLDL Cholesterol Cal: 20 mg/dL (ref 5–40)

## 2020-05-04 LAB — TSH: TSH: 2.58 u[IU]/mL (ref 0.450–4.500)

## 2020-05-13 ENCOUNTER — Encounter: Payer: Self-pay | Admitting: Internal Medicine

## 2020-05-14 ENCOUNTER — Telehealth: Payer: Self-pay | Admitting: Internal Medicine

## 2020-05-14 ENCOUNTER — Other Ambulatory Visit: Payer: Self-pay | Admitting: Pharmacist

## 2020-05-14 DIAGNOSIS — E669 Obesity, unspecified: Secondary | ICD-10-CM

## 2020-05-14 DIAGNOSIS — E1169 Type 2 diabetes mellitus with other specified complication: Secondary | ICD-10-CM

## 2020-05-14 DIAGNOSIS — E039 Hypothyroidism, unspecified: Secondary | ICD-10-CM

## 2020-05-14 MED ORDER — LEVOTHYROXINE SODIUM 50 MCG PO TABS: 50.0000 ug | ORAL_TABLET | Freq: Every day | ORAL | 0 refills | Status: DC

## 2020-05-14 MED ORDER — ATORVASTATIN CALCIUM 40 MG PO TABS: 20.0000 mg | ORAL_TABLET | Freq: Every day | ORAL | 3 refills | Status: DC

## 2020-05-14 MED ORDER — LEVOTHYROXINE SODIUM 50 MCG PO TABS: 50.0000 ug | ORAL_TABLET | Freq: Every day | ORAL | 2 refills | Status: DC

## 2020-05-14 MED ORDER — ATORVASTATIN CALCIUM 40 MG PO TABS: 20.0000 mg | ORAL_TABLET | Freq: Every day | ORAL | 0 refills | Status: DC

## 2020-05-14 MED FILL — ATORVASTATIN CALCIUM 40 MG: 40 | 30 days supply | Qty: 15 | Fill #0

## 2020-05-14 MED FILL — LEVOTHYROXINE SODIUM 50 MCG: 50 | 30 days supply | Qty: 30 | Fill #1

## 2020-05-14 NOTE — Telephone Encounter (Signed)
Patient would like a call from the nurse regarding some meds that she is trying to get.  Please call patient to discuss at [phone]

## 2020-05-14 NOTE — Telephone Encounter (Signed)
[facility]. Patient is getting 90 days.   Has not received levothyroxine and Lipitor. Pending paperwork from PCP.  Informed patient that Rx was sent today but will take sometime to get to her address from the pharmacy.   Send 30 days  Supply of atorvastatin and levothyroxine to [facility] pharmacy. Patient verbalized understanding.

## 2020-07-01 ENCOUNTER — Ambulatory Visit: Payer: No Typology Code available for payment source | Admitting: Internal Medicine

## 2020-07-15 DIAGNOSIS — U071 COVID-19: Secondary | ICD-10-CM | POA: Diagnosis not present

## 2020-09-02 ENCOUNTER — Ambulatory Visit: Payer: No Typology Code available for payment source | Admitting: Internal Medicine

## 2020-09-04 ENCOUNTER — Other Ambulatory Visit: Payer: Self-pay | Admitting: Physician Assistant

## 2020-09-04 ENCOUNTER — Encounter: Payer: Self-pay | Admitting: Physician Assistant

## 2020-09-04 ENCOUNTER — Ambulatory Visit: Payer: BC Managed Care – PPO | Attending: Physician Assistant | Admitting: Physician Assistant

## 2020-09-04 ENCOUNTER — Other Ambulatory Visit: Payer: Self-pay

## 2020-09-04 DIAGNOSIS — H6690 Otitis media, unspecified, unspecified ear: Secondary | ICD-10-CM

## 2020-09-04 DIAGNOSIS — E1169 Type 2 diabetes mellitus with other specified complication: Secondary | ICD-10-CM | POA: Diagnosis not present

## 2020-09-04 DIAGNOSIS — E669 Obesity, unspecified: Secondary | ICD-10-CM

## 2020-09-04 MED ORDER — AMOXICILLIN 500 MG PO CAPS: 500.0000 mg | ORAL_CAPSULE | Freq: Three times a day (TID) | ORAL | 0 refills | Status: DC

## 2020-09-04 MED ORDER — FLUTICASONE PROPIONATE 50 MCG/ACT NA SUSP: 2.0000 | Freq: Every day | NASAL | 6 refills | Status: DC

## 2020-09-04 MED FILL — AMOXICILLIN 500 MG CAPSULE: 500 | 10 days supply | Qty: 30 | Fill #0

## 2020-09-04 MED FILL — FLUTICASONE PROP 50 MCG SPR: 50 | 30 days supply | Qty: 16 | Fill #0

## 2020-09-04 NOTE — Progress Notes (Signed)
Virtual Visit via Telephone Note  I connected with Heather Bailey on 09/04/20 at  4:10 PM EDT by telephone and verified that I am speaking with the correct person using two identifiers.  Location: Patient: home Provider: [facility] office   I discussed the limitations, risks, security and privacy concerns of performing an evaluation and management service by telephone and the availability of in person appointments. I also discussed with the patient that there may be a patient responsible charge related to this service. The patient expressed understanding and agreed to proceed.   History of Present Illness: Patient has been having some residual mild cough since covid about 1 month ago and now having ear congestion and ear pain.  Describes as aching.  No fever.  Ear pain now for about 5 days and getting worse.    Not checking blood sugars.  Last a1c 02/2020=6.5.  Her work is getting employees glucose meters.  Not currently checking blood sugars.  Compliant with meds   Observations/Objective:  NAD.  A&Ox3   Assessment and Plan: 1. Type 2 diabetes mellitus with obesity (HCC) Increase water intake as Cr has been slightly elevate.  Continue current regimen - Hemoglobin A1c; Future - Basic metabolic panel; Future I have had a lengthy discussion and provided education about insulin resistance and the intake of too much sugar/refined carbohydrates.  I have advised the patient to work at a goal of eliminating sugary drinks, candy, desserts, sweets, refined sugars, processed foods, and white carbohydrates.  The patient expresses understanding.    2. Ear infection - amoxicillin (AMOXIL) 500 MG capsule; Take 1 capsule (500 mg total) by mouth 3 (three) times daily.  Dispense: 30 capsule; Refill: 0 - fluticasone (FLONASE) 50 MCG/ACT nasal spray; Place 2 sprays into both nostrils daily.  Dispense: 16 g; Refill: 6    Follow Up Instructions: See PCP in 2-3 months   I discussed the assessment and treatment  plan with the patient. The patient was provided an opportunity to ask questions and all were answered. The patient agreed with the plan and demonstrated an understanding of the instructions.   The patient was advised to call back or seek an in-person evaluation if the symptoms worsen or if the condition fails to improve as anticipated.  I provided 12 minutes of non-face-to-face time during this encounter.   Heather Caldron, PA-C  Patient ID: Heather Bailey, female   DOB: June 05, 1961, 60 y.o.   MRN: [id number]

## 2020-09-05 ENCOUNTER — Other Ambulatory Visit: Payer: Self-pay

## 2020-09-05 ENCOUNTER — Ambulatory Visit: Payer: BC Managed Care – PPO | Attending: Internal Medicine

## 2020-09-05 DIAGNOSIS — E1169 Type 2 diabetes mellitus with other specified complication: Secondary | ICD-10-CM | POA: Diagnosis not present

## 2020-09-05 DIAGNOSIS — E669 Obesity, unspecified: Secondary | ICD-10-CM | POA: Diagnosis not present

## 2020-09-06 LAB — BASIC METABOLIC PANEL
BUN/Creatinine Ratio: 13 (ref 9–23)
BUN: 14 mg/dL (ref 6–24)
CO2: 22 mmol/L (ref 20–29)
Calcium: 10 mg/dL (ref 8.7–10.2)
Chloride: 101 mmol/L (ref 96–106)
Creatinine, Ser: 1.06 mg/dL — ABNORMAL HIGH (ref 0.57–1.00)
Glucose: 86 mg/dL (ref 65–99)
Potassium: 4.5 mmol/L (ref 3.5–5.2)
Sodium: 139 mmol/L (ref 134–144)
eGFR: 61 mL/min/{1.73_m2} (ref >59)

## 2020-09-06 LAB — HEMOGLOBIN A1C
Est. average glucose Bld gHb Est-mCnc: 131 mg/dL
Hgb A1c MFr Bld: 6.2 % — ABNORMAL HIGH (ref 4.8–5.6)

## 2020-09-07 ENCOUNTER — Other Ambulatory Visit: Payer: Self-pay | Admitting: Internal Medicine

## 2020-09-07 DIAGNOSIS — E039 Hypothyroidism, unspecified: Secondary | ICD-10-CM

## 2020-09-07 NOTE — Telephone Encounter (Signed)
Approved per protocol. Most recent TSH was 05/03/20 WNL. Requested Prescriptions  Pending Prescriptions Disp Refills  . levothyroxine (SYNTHROID) 50 MCG tablet [Pharmacy Med Name: L-THYROXINE (SYNTHROID) TABS 50MCG] 90 tablet 0    Sig: TAKE 1 TABLET DAILY     Endocrinology:  Hypothyroid Agents Failed - 09/07/2020 11:24 AM      Failed - TSH needs to be rechecked within 3 months after an abnormal result. Refill until TSH is due.      Passed - TSH in normal range and within 360 days    TSH  Date Value Ref Range Status  05/03/2020 2.580 0.450 - 4.500 uIU/mL Final         Passed - Valid encounter within last 12 months    Recent Outpatient Visits          3 days ago Type 2 diabetes mellitus with obesity [facility])   [place], [place], [place]   4 months ago Annual physical exam   [place] Ladell Pier, MD   6 months ago Need for influenza vaccination   [place], RPH-CPP   6 months ago Type 2 diabetes mellitus with obesity [facility])   [place] Ladell Pier, MD   10 months ago Type 2 diabetes mellitus with obesity [facility])   [place] [facility] [facility] Ladell Pier, MD

## 2020-09-11 MED FILL — FLUTICASONE PROP 50 MCG SPR: 50 | 30 days supply | Qty: 16 | Fill #0

## 2020-09-11 MED FILL — AMOXICILLIN 500 MG CAPSULE: 500 | 10 days supply | Qty: 30 | Fill #0

## 2020-09-13 ENCOUNTER — Encounter (INDEPENDENT_AMBULATORY_CARE_PROVIDER_SITE_OTHER): Payer: Self-pay

## 2020-10-30 DIAGNOSIS — H40013 Open angle with borderline findings, low risk, bilateral: Secondary | ICD-10-CM | POA: Diagnosis not present

## 2020-10-30 DIAGNOSIS — H04123 Dry eye syndrome of bilateral lacrimal glands: Secondary | ICD-10-CM | POA: Diagnosis not present

## 2020-10-30 DIAGNOSIS — H35033 Hypertensive retinopathy, bilateral: Secondary | ICD-10-CM | POA: Diagnosis not present

## 2020-10-30 DIAGNOSIS — E119 Type 2 diabetes mellitus without complications: Secondary | ICD-10-CM | POA: Diagnosis not present

## 2020-10-31 ENCOUNTER — Other Ambulatory Visit: Payer: Self-pay

## 2020-10-31 MED ORDER — CYCLOSPORINE 0.05 % OP EMUL
OPHTHALMIC | 3 refills | Status: DC
  Filled 2020-10-31: qty 60, 30d supply, fill #0

## 2020-11-01 ENCOUNTER — Other Ambulatory Visit: Payer: Self-pay

## 2020-11-04 ENCOUNTER — Other Ambulatory Visit: Payer: Self-pay

## 2020-11-06 ENCOUNTER — Other Ambulatory Visit: Payer: Self-pay

## 2020-11-06 ENCOUNTER — Ambulatory Visit: Payer: BC Managed Care – PPO | Attending: Physician Assistant | Admitting: Physician Assistant

## 2020-11-06 ENCOUNTER — Encounter: Payer: Self-pay | Admitting: Physician Assistant

## 2020-11-06 VITALS — BP 133/83 | HR 63 | Resp 16 | Wt 196.6 lb

## 2020-11-06 DIAGNOSIS — M545 Low back pain, unspecified: Secondary | ICD-10-CM

## 2020-11-06 DIAGNOSIS — E669 Obesity, unspecified: Secondary | ICD-10-CM | POA: Diagnosis not present

## 2020-11-06 DIAGNOSIS — F419 Anxiety disorder, unspecified: Secondary | ICD-10-CM

## 2020-11-06 DIAGNOSIS — I10 Essential (primary) hypertension: Secondary | ICD-10-CM | POA: Diagnosis not present

## 2020-11-06 DIAGNOSIS — Z1211 Encounter for screening for malignant neoplasm of colon: Secondary | ICD-10-CM

## 2020-11-06 DIAGNOSIS — E039 Hypothyroidism, unspecified: Secondary | ICD-10-CM | POA: Diagnosis not present

## 2020-11-06 DIAGNOSIS — E785 Hyperlipidemia, unspecified: Secondary | ICD-10-CM

## 2020-11-06 DIAGNOSIS — E1169 Type 2 diabetes mellitus with other specified complication: Secondary | ICD-10-CM | POA: Diagnosis not present

## 2020-11-06 DIAGNOSIS — G8929 Other chronic pain: Secondary | ICD-10-CM

## 2020-11-06 DIAGNOSIS — F32A Depression, unspecified: Secondary | ICD-10-CM

## 2020-11-06 LAB — GLUCOSE, POCT (MANUAL RESULT ENTRY): POC Glucose: 93 mg/dl (ref 70–99)

## 2020-11-06 MED ORDER — FLUTICASONE PROPIONATE 50 MCG/ACT NA SUSP
2.0000 | Freq: Every day | NASAL | 6 refills | Status: DC
  Filled 2020-11-06: qty 16, 30d supply, fill #0

## 2020-11-06 MED ORDER — GABAPENTIN 300 MG PO CAPS
300.0000 mg | ORAL_CAPSULE | Freq: Every day | ORAL | 3 refills | Status: DC
  Filled 2020-11-06: qty 90, 90d supply, fill #0

## 2020-11-06 MED ORDER — LOSARTAN POTASSIUM 25 MG PO TABS
12.5000 mg | ORAL_TABLET | Freq: Every day | ORAL | 3 refills | Status: DC
  Filled 2020-11-06: qty 15, 30d supply, fill #0

## 2020-11-06 MED ORDER — LEVOTHYROXINE SODIUM 50 MCG PO TABS
50.0000 ug | ORAL_TABLET | Freq: Every day | ORAL | 1 refills | Status: DC
  Filled 2020-11-06 – 2020-12-17 (×2): qty 30, 30d supply, fill #0
  Filled 2021-01-16: qty 30, 30d supply, fill #1
  Filled 2021-02-07: qty 30, 30d supply, fill #2
  Filled 2021-03-16: qty 30, 30d supply, fill #3

## 2020-11-06 MED ORDER — ATORVASTATIN CALCIUM 40 MG PO TABS
ORAL_TABLET | Freq: Every day | ORAL | 3 refills | Status: DC
  Filled 2020-11-06 (×2): qty 15, 30d supply, fill #0

## 2020-11-06 MED ORDER — METOPROLOL SUCCINATE ER 25 MG PO TB24
25.0000 mg | ORAL_TABLET | Freq: Every day | ORAL | 3 refills | Status: DC
  Filled 2020-11-06: qty 30, 30d supply, fill #0

## 2020-11-06 MED ORDER — METFORMIN HCL ER 500 MG PO TB24
1000.0000 mg | ORAL_TABLET | Freq: Every day | ORAL | 3 refills | Status: DC
  Filled 2020-11-06: qty 60, 30d supply, fill #0

## 2020-11-06 MED ORDER — SERTRALINE HCL 100 MG PO TABS
100.0000 mg | ORAL_TABLET | Freq: Every day | ORAL | 2 refills | Status: DC
  Filled 2020-11-06: qty 30, 30d supply, fill #0

## 2020-11-06 NOTE — Progress Notes (Signed)
Patient ID: Heather Bailey, female   DOB: 1961-01-27, 60 y.o.   MRN: [id number]   Heather Bailey, is a 60 y.o. female  LAG:[id number]  ZYY:[id number]  DOB - [birth year]  Subjective:  Chief Complaint and HPI: Heather Bailey is a 60 y.o. female here today for labs and med RF.  Does not check blood sugars regularly, but when she does, they are usu under 100.   No new issues or concerns today.  Compliant with meds.  Needs colon CA screening.    ROS:   Constitutional:  No f/c, No night sweats, No unexplained weight loss. EENT:  No vision changes, No blurry vision, No hearing changes. No mouth, throat, or ear problems.  Respiratory: No cough, No SOB Cardiac: No CP, no palpitations GI:  No abd pain, No N/V/D. GU: No Urinary s/sx Musculoskeletal: No joint pain Neuro: No headache, no dizziness, no motor weakness.  Skin: No rash Endocrine:  No polydipsia. No polyuria.  Psych: Denies SI/HI  No problems updated.  ALLERGIES: Allergies  Allergen Reactions  . Zithromax [Azithromycin Dihydrate]     PAST MEDICAL HISTORY: Past Medical History:  Diagnosis Date  . Anemia Dx 2003  . Arthritis Dx 1999  . DVT (deep venous thrombosis) ([place])   . Fibroids   . Gestational diabetes   . Hyperlipidemia Dx 2000  . Hypertension Dx 2000  . Hypertensive retinopathy   . Thyroid disease Dx 2005    MEDICATIONS AT HOME: Prior to Admission medications   Medication Sig Start Date End Date Taking? Authorizing Provider  amoxicillin (AMOXIL) 500 MG capsule TAKE 1 CAPSULE (500 MG TOTAL) BY MOUTH 3 (THREE) TIMES DAILY. Patient not taking: Reported on 11/06/2020 09/04/20 09/04/21  Argentina Donovan, PA-C  atorvastatin (LIPITOR) 40 MG tablet TAKE 1/2 TABLET BY MOUTH DAILY. 11/06/20 11/06/21  Argentina Donovan, PA-C  cycloSPORINE (RESTASIS) 0.05 % ophthalmic emulsion Instill 1 drop in both eyes twice a day 10/31/20     diclofenac sodium (VOLTAREN) 1 % GEL Apply 2 g topically 4 (four) times daily. 03/03/19   Fulp, Cammie, MD   fluticasone (FLONASE) 50 MCG/ACT nasal spray PLACE 2 SPRAYS INTO BOTH NOSTRILS DAILY. 11/06/20 11/06/21  Argentina Donovan, PA-C  gabapentin (NEURONTIN) 300 MG capsule Take 1 capsule (300 mg total) by mouth at bedtime. 11/06/20   Argentina Donovan, PA-C  glucose blood (TRUE METRIX BLOOD GLUCOSE TEST) test strip Use as instructed to check blood sugar up to 3 times daily. 08/17/18   Ladell Pier, MD  levothyroxine (SYNTHROID) 50 MCG tablet Take 1 tablet (50 mcg total) by mouth daily. 11/06/20   Argentina Donovan, PA-C  losartan (COZAAR) 25 MG tablet Take 0.5 tablets (12.5 mg total) by mouth daily. 11/06/20   Argentina Donovan, PA-C  metFORMIN (GLUCOPHAGE-XR) 500 MG 24 hr tablet Take 2 tablets (1,000 mg total) by mouth daily. 11/06/20   Argentina Donovan, PA-C  metoprolol succinate (TOPROL-XL) 25 MG 24 hr tablet Take 1 tablet (25 mg total) by mouth daily. 11/06/20   Argentina Donovan, PA-C  sertraline (ZOLOFT) 100 MG tablet Take 1 tablet (100 mg total) by mouth daily. 11/06/20   Argentina Donovan, PA-C  TRUEPLUS LANCETS 28G MISC Use to check blood sugar up to 3 times daily. 08/17/18   Ladell Pier, MD     Objective:  EXAM:   Vitals:   11/06/20 0900  BP: 133/83  Pulse: 63  Resp: 16  SpO2: 99%  Weight: 196 lb 9.6 oz (  89.2 kg)    General appearance : A&OX3. NAD. Non-toxic-appearing HEENT: Atraumatic and Normocephalic.  PERRLA. EOM intact.   Chest/Lungs:  Breathing-non-labored, Good air entry bilaterally, breath sounds normal without rales, rhonchi, or wheezing  CVS: S1 S2 regular, no murmurs, gallops, rubs . Extremities: Bilateral Lower Ext shows no edema, both legs are warm to touch with = pulse throughout Neurology:  CN II-XII grossly intact, Non focal.   Psych:  TP linear. J/I WNL. Normal speech. Appropriate eye contact and affect.  Skin:  No Rash  Data Review Lab Results  Component Value Date   HGBA1C 6.2 (H) 09/05/2020   HGBA1C 6.5 (H) 02/29/2020   HGBA1C 6.2 (H) 07/10/2019      Assessment & Plan   1. Type 2 diabetes mellitus with obesity (HCC) Controlled currently - Glucose (CBG) - Microalbumin / creatinine urine ratio - atorvastatin (LIPITOR) 40 MG tablet; TAKE 1/2 TABLET BY MOUTH DAILY.  Dispense: 30 tablet; Refill: 3 - metFORMIN (GLUCOPHAGE-XR) 500 MG 24 hr tablet; Take 2 tablets (1,000 mg total) by mouth daily.  Dispense: 180 tablet; Refill: 3  2. Colon cancer screening - Fecal occult blood, imunochemical  3. Hypothyroidism, unspecified type - Thyroid Panel With TSH - levothyroxine (SYNTHROID) 50 MCG tablet; Take 1 tablet (50 mcg total) by mouth daily.  Dispense: 90 tablet; Refill: 1  4. Essential hypertension - Comprehensive metabolic panel - metoprolol succinate (TOPROL-XL) 25 MG 24 hr tablet; Take 1 tablet (25 mg total) by mouth daily.  Dispense: 90 tablet; Refill: 3 - losartan (COZAAR) 25 MG tablet; Take 0.5 tablets (12.5 mg total) by mouth daily.  Dispense: 45 tablet; Refill: 3  5. Chronic low back pain - gabapentin (NEURONTIN) 300 MG capsule; Take 1 capsule (300 mg total) by mouth at bedtime.  Dispense: 90 capsule; Refill: 3  6. Anxiety and depression stable - sertraline (ZOLOFT) 100 MG tablet; Take 1 tablet (100 mg total) by mouth daily.  Dispense: 90 tablet; Refill: 2  7. Hyperlipidemia associated with type 2 diabetes mellitus (HCC) - atorvastatin (LIPITOR) 40 MG tablet; TAKE 1/2 TABLET BY MOUTH DAILY.  Dispense: 30 tablet; Refill: 3 - Lipid panel - Comprehensive metabolic panel    Patient have been counseled extensively about nutrition and exercise  Return in about 3 months (around 02/06/2021) for Dr Wynetta Emery for DM/htn/cholesterol.  The patient was given clear instructions to go to ER or return to medical center if symptoms don't improve, worsen or new problems develop. The patient verbalized understanding. The patient was told to call to get lab results if they haven't heard anything in the next week.     Freeman Caldron,  PA-C [facility] and [facility] [place], [place]   11/06/2020, 9:15 AM

## 2020-11-07 LAB — THYROID PANEL WITH TSH
Free Thyroxine Index: 2.5 (ref 1.2–4.9)
T3 Uptake Ratio: 27 % (ref 24–39)
T4, Total: 9.2 ug/dL (ref 4.5–12.0)
TSH: 5.93 u[IU]/mL — ABNORMAL HIGH (ref 0.450–4.500)

## 2020-11-07 LAB — MICROALBUMIN / CREATININE URINE RATIO
Creatinine, Urine: 287.1 mg/dL
Microalb/Creat Ratio: 4 mg/g creat (ref 0–29)
Microalbumin, Urine: 10.1 ug/mL

## 2020-11-07 LAB — COMPREHENSIVE METABOLIC PANEL
ALT: 12 IU/L (ref 0–32)
AST: 11 IU/L (ref 0–40)
Albumin/Globulin Ratio: 1.6 (ref 1.2–2.2)
Albumin: 4.7 g/dL (ref 3.8–4.9)
Alkaline Phosphatase: 92 IU/L (ref 44–121)
BUN/Creatinine Ratio: 15 (ref 12–28)
BUN: 18 mg/dL (ref 8–27)
Bilirubin Total: <0.2 mg/dL (ref 0.0–1.2)
CO2: 24 mmol/L (ref 20–29)
Calcium: 10.1 mg/dL (ref 8.7–10.3)
Chloride: 102 mmol/L (ref 96–106)
Creatinine, Ser: 1.17 mg/dL — ABNORMAL HIGH (ref 0.57–1.00)
Globulin, Total: 3 g/dL (ref 1.5–4.5)
Glucose: 89 mg/dL (ref 65–99)
Potassium: 4.4 mmol/L (ref 3.5–5.2)
Sodium: 139 mmol/L (ref 134–144)
Total Protein: 7.7 g/dL (ref 6.0–8.5)
eGFR: 53 mL/min/{1.73_m2} — ABNORMAL LOW (ref >59)

## 2020-11-07 LAB — LIPID PANEL
Chol/HDL Ratio: 3.9 ratio (ref 0.0–4.4)
Cholesterol, Total: 155 mg/dL (ref 100–199)
HDL: 40 mg/dL (ref >39)
LDL Chol Calc (NIH): 93 mg/dL (ref 0–99)
Triglycerides: 119 mg/dL (ref 0–149)
VLDL Cholesterol Cal: 22 mg/dL (ref 5–40)

## 2020-11-13 ENCOUNTER — Other Ambulatory Visit: Payer: Self-pay

## 2020-12-14 ENCOUNTER — Other Ambulatory Visit: Payer: Self-pay | Admitting: Internal Medicine

## 2020-12-14 DIAGNOSIS — E039 Hypothyroidism, unspecified: Secondary | ICD-10-CM

## 2020-12-14 NOTE — Telephone Encounter (Signed)
last RF 11/10/20 #90 1 RF

## 2020-12-17 ENCOUNTER — Other Ambulatory Visit: Payer: Self-pay

## 2020-12-18 ENCOUNTER — Other Ambulatory Visit: Payer: Self-pay

## 2020-12-30 ENCOUNTER — Other Ambulatory Visit: Payer: Self-pay | Admitting: Internal Medicine

## 2020-12-30 DIAGNOSIS — F32A Depression, unspecified: Secondary | ICD-10-CM

## 2021-01-02 DIAGNOSIS — Z20828 Contact with and (suspected) exposure to other viral communicable diseases: Secondary | ICD-10-CM | POA: Diagnosis not present

## 2021-01-16 ENCOUNTER — Other Ambulatory Visit: Payer: Self-pay

## 2021-01-17 ENCOUNTER — Other Ambulatory Visit: Payer: Self-pay

## 2021-01-23 ENCOUNTER — Other Ambulatory Visit: Payer: Self-pay

## 2021-02-06 ENCOUNTER — Ambulatory Visit: Payer: BC Managed Care – PPO | Attending: Internal Medicine | Admitting: Internal Medicine

## 2021-02-06 ENCOUNTER — Other Ambulatory Visit: Payer: Self-pay

## 2021-02-06 VITALS — BP 120/84 | HR 63 | Resp 16 | Wt 194.2 lb

## 2021-02-06 DIAGNOSIS — E039 Hypothyroidism, unspecified: Secondary | ICD-10-CM | POA: Diagnosis not present

## 2021-02-06 DIAGNOSIS — Z23 Encounter for immunization: Secondary | ICD-10-CM

## 2021-02-06 DIAGNOSIS — I10 Essential (primary) hypertension: Secondary | ICD-10-CM | POA: Diagnosis not present

## 2021-02-06 DIAGNOSIS — E669 Obesity, unspecified: Secondary | ICD-10-CM

## 2021-02-06 DIAGNOSIS — E1169 Type 2 diabetes mellitus with other specified complication: Secondary | ICD-10-CM

## 2021-02-06 DIAGNOSIS — E785 Hyperlipidemia, unspecified: Secondary | ICD-10-CM | POA: Diagnosis not present

## 2021-02-06 DIAGNOSIS — E119 Type 2 diabetes mellitus without complications: Secondary | ICD-10-CM

## 2021-02-06 LAB — GLUCOSE, POCT (MANUAL RESULT ENTRY): POC Glucose: 106 mg/dl — AB (ref 70–99)

## 2021-02-06 NOTE — Progress Notes (Signed)
Patient ID: Heather Bailey, female    DOB: Jan 21, 1961  MRN: [id number]  CC: Diabetes and Hypertension   Subjective: Heather Bailey is a 60 y.o. female who presents for chronic ds management Her concerns today include:  Pt with hx of HTN, DM, hypothyroidism, HL, depression, chronic LBP (on Gabapentin), dry skin.    DM:  Results for orders placed or performed in visit on 02/06/21  POCT glucose (manual entry)  Result Value Ref Range   POC Glucose 106 (A) 70 - 99 mg/dl   Lab Results  Component Value Date   HGBA1C 6.2 (H) 09/05/2020  Not checking blood sugars because she has misplaced her lancets. She tries to eat healthy. Not getting in exercise. 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  The days that she is off she finds that she is very tired and just wants to sleep. Compliant with taking metformin  HTN:  checking BP 3-4 x a wk. BP last night was 122/77 which is the lowest she has seen it in the past few weeks.  Most of the times blood pressures have been running up to 130/up to 85 last night.  Compliant with taking Cozaar and metoprolol.  She has not taken those as yet for today.    HL: taking and tolerating Lipitor.  Thyroid: Reports compliance with taking levothyroxine but she states that the time of day when she takes it can sometimes vary depending on her schedule.  Last TSH level was a little above goal.    HM:  had COVID booster 06/04/20 Pfizer. Due for Prevnar 20 and Zoster vaccines.   Patient Active Problem List   Diagnosis Date Noted   Hyperlipidemia associated with type 2 diabetes mellitus ([place]) 11/03/2019   Class 1 obesity due to excess calories with serious comorbidity and body mass index (BMI) of 32.0 to 32.9 in adult 11/03/2019   Floaters in visual field, right 11/15/2018   Onychomycosis 08/15/2018   Achilles tendinitis of right lower extremity 08/15/2018   Hot flashes 02/23/2017   Chronic low back pain 04/12/2015   Dry skin 04/12/2015   Chronic dryness of both  eyes 08/28/2014   DM2 (diabetes mellitus, type 2) ([place]) 03/19/2014   Hypothyroidism 03/19/2014   Hypertension 03/19/2014   Hyperlipidemia 03/19/2014   Depression 03/19/2014     Current Outpatient Medications on File Prior to Visit  Medication Sig Dispense Refill   atorvastatin (LIPITOR) 40 MG tablet TAKE 1/2 TABLET BY MOUTH DAILY. 30 tablet 3   cycloSPORINE (RESTASIS) 0.05 % ophthalmic emulsion Instill 1 drop in both eyes twice a day 60 each 3   diclofenac sodium (VOLTAREN) 1 % GEL Apply 2 g topically 4 (four) times daily. 100 g 1   fluticasone (FLONASE) 50 MCG/ACT nasal spray PLACE 2 SPRAYS INTO BOTH NOSTRILS DAILY. 16 g 6   gabapentin (NEURONTIN) 300 MG capsule Take 1 capsule (300 mg total) by mouth at bedtime. 90 capsule 3   glucose blood (TRUE METRIX BLOOD GLUCOSE TEST) test strip Use as instructed to check blood sugar up to 3 times daily. 100 each 2   levothyroxine (SYNTHROID) 50 MCG tablet Take 1 tablet (50 mcg total) by mouth daily. 90 tablet 1   losartan (COZAAR) 25 MG tablet Take 0.5 tablets (12.5 mg total) by mouth daily. 45 tablet 3   metFORMIN (GLUCOPHAGE-XR) 500 MG 24 hr tablet Take 2 tablets (1,000 mg total) by mouth daily. 180 tablet 3   metoprolol succinate (TOPROL-XL) 25 MG  24 hr tablet Take 1 tablet (25 mg total) by mouth daily. 90 tablet 3   sertraline (ZOLOFT) 100 MG tablet TAKE 1 TABLET DAILY 90 tablet 0   TRUEPLUS LANCETS 28G MISC Use to check blood sugar up to 3 times daily. 100 each 2   No current facility-administered medications on file prior to visit.    Allergies  Allergen Reactions   Zithromax [Azithromycin Dihydrate]     Social History   Socioeconomic History   Marital status: Married    Spouse name: Not on file   Number of children: 2   Years of education: Not on file   Highest education level: Bachelor's degree (e.g., BA, AB, BS)  Occupational History   Not on file  Tobacco Use   Smoking status: Never   Smokeless tobacco: Never  Vaping Use    Vaping Use: Never used  Substance and Sexual Activity   Alcohol use: No   Drug use: No   Sexual activity: Not Currently    Birth control/protection: Surgical  Other Topics Concern   Not on file  Social History Narrative   Lives with husband who has diabetes.    Social Determinants of Health   Financial Resource Strain: Not on file  Food Insecurity: Not on file  Transportation Needs: Not on file  Physical Activity: Not on file  Stress: Not on file  Social Connections: Not on file  Intimate Partner Violence: Not on file    Family History  Problem Relation Age of Onset   Hypertension Mother    Cancer Mother        colon/cervical   Alzheimer's disease Sister     Past Surgical History:  Procedure Laterality Date   CESAREAN SECTION     MYOMECTOMY     TUBAL LIGATION      ROS: Review of Systems Negative except as stated above  PHYSICAL EXAM: BP 120/84   Pulse 63   Resp 16   Wt 194 lb 3.2 oz (88.1 kg)   SpO2 96%   BMI 31.34 kg/m   Wt Readings from Last 3 Encounters:  02/06/21 194 lb 3.2 oz (88.1 kg)  11/06/20 196 lb 9.6 oz (89.2 kg)  05/03/20 198 lb 3.2 oz (89.9 kg)    Physical Exam  General appearance - alert, well appearing, and in no distress Mental status - normal mood, behavior, speech, dress, motor activity, and thought processes Neck - supple, no significant adenopathy Chest - clear to auscultation, no wheezes, rales or rhonchi, symmetric air entry Heart - normal rate, regular rhythm, normal S1, S2, no murmurs, rubs, clicks or gallops Extremities - peripheral pulses normal, no pedal edema, no clubbing or cyanosis   CMP Latest Ref Rng & Units 11/06/2020 09/05/2020 05/03/2020  Glucose 65 - 99 mg/dL 89 86 105(H)  BUN 8 - 27 mg/dL '18 14 17  '$ Creatinine 0.57 - 1.00 mg/dL 1.17(H) 1.06(H) 1.15(H)  Sodium 134 - 144 mmol/L 139 139 140  Potassium 3.5 - 5.2 mmol/L 4.4 4.5 4.5  Chloride 96 - 106 mmol/L 102 101 103  CO2 20 - 29 mmol/L '24 22 25  '$ Calcium 8.7 -  10.3 mg/dL 10.1 10.0 10.3(H)  Total Protein 6.0 - 8.5 g/dL 7.7 - 8.0  Total Bilirubin 0.0 - 1.2 mg/dL <0.2 - 0.4  Alkaline Phos 44 - 121 IU/L 92 - 96  AST 0 - 40 IU/L 11 - 15  ALT 0 - 32 IU/L 12 - 17   Lipid Panel  Component Value Date/Time   CHOL 155 11/06/2020 0924   TRIG 119 11/06/2020 0924   HDL 40 11/06/2020 0924   CHOLHDL 3.9 11/06/2020 0924   CHOLHDL 4.3 12/19/2015 1620   VLDL 20 12/19/2015 1620   LDLCALC 93 11/06/2020 0924    CBC    Component Value Date/Time   WBC 4.9 05/03/2020 1624   WBC 6.6 03/10/2013 2005   RBC 4.59 05/03/2020 1624   RBC 3.99 03/10/2013 2005   HGB 13.0 05/03/2020 1624   HCT 40.2 05/03/2020 1624   PLT 321 05/03/2020 1624   MCV 88 05/03/2020 1624   MCH 28.3 05/03/2020 1624   MCH 29.3 03/10/2013 2005   MCHC 32.3 05/03/2020 1624   MCHC 33.2 03/10/2013 2005   RDW 13.1 05/03/2020 1624    ASSESSMENT AND PLAN: 1. Type 2 diabetes mellitus with obesity ([place]) Encouraged her to try to eat healthy.  Encouraged her to try to move as much as she can.  Continue metformin.  She will look for her lancets and resume checking blood sugars a few times a week. - POCT glucose (manual entry) - Hemoglobin A1c; Future   2. Essential hypertension Close to goal.  No changes made in medications.  Advised to continue checking blood pressure at least once to twice a week with goal being 130/80 or lower.  3. Hypothyroidism, unspecified type Recommend taking the levothyroxine around about the same time every day.  She may want to get a separate med box for this and keep it in a visible area so that she is reminded to take it every morning - TSH; Future - TSH  4. Hyperlipidemia associated with type 2 diabetes mellitus (HCC) Continue atorvastatin.  5. Need for immunization against influenza - Flu Vaccine QUAD 53moIM (Fluarix, Fluzone & Alfiuria Quad PF)  6. Need for vaccination against Streptococcus pneumoniae using pneumococcal conjugate vaccine 20 -  Pneumococcal conjugate vaccine 20-valent    Patient was given the opportunity to ask questions.  Patient verbalized understanding of the plan and was able to repeat key elements of the plan.   Orders Placed This Encounter  Procedures   Flu Vaccine QUAD 629moM (Fluarix, Fluzone & Alfiuria Quad PF)   Pneumococcal conjugate vaccine 20-valent   TSH   Hemoglobin A1c   POCT glucose (manual entry)     Requested Prescriptions    No prescriptions requested or ordered in this encounter    Return in about 4 months (around 06/08/2021).  DeKarle PlumberMD, FACP

## 2021-02-07 ENCOUNTER — Other Ambulatory Visit: Payer: Self-pay | Admitting: Internal Medicine

## 2021-02-07 LAB — HEMOGLOBIN A1C
Est. average glucose Bld gHb Est-mCnc: 134 mg/dL
Hgb A1c MFr Bld: 6.3 % — ABNORMAL HIGH (ref 4.8–5.6)

## 2021-02-07 LAB — TSH: TSH: 5.77 u[IU]/mL — ABNORMAL HIGH (ref 0.450–4.500)

## 2021-02-10 ENCOUNTER — Other Ambulatory Visit: Payer: Self-pay

## 2021-02-14 ENCOUNTER — Other Ambulatory Visit: Payer: Self-pay

## 2021-02-17 ENCOUNTER — Other Ambulatory Visit: Payer: Self-pay | Admitting: Internal Medicine

## 2021-02-17 DIAGNOSIS — E669 Obesity, unspecified: Secondary | ICD-10-CM

## 2021-02-17 DIAGNOSIS — E785 Hyperlipidemia, unspecified: Secondary | ICD-10-CM

## 2021-02-17 DIAGNOSIS — E1169 Type 2 diabetes mellitus with other specified complication: Secondary | ICD-10-CM

## 2021-02-18 NOTE — Telephone Encounter (Signed)
Requested Prescriptions  Pending Prescriptions Disp Refills  . atorvastatin (LIPITOR) 40 MG tablet [Pharmacy Med Name: ATORVASTATIN TABS '40MG'$ ] 45 tablet 2    Sig: TAKE ONE-HALF (1/2) TABLET DAILY     Cardiovascular:  Antilipid - Statins Passed - 02/17/2021 12:46 AM      Passed - Total Cholesterol in normal range and within 360 days    Cholesterol, Total  Date Value Ref Range Status  11/06/2020 155 100 - 199 mg/dL Final         Passed - LDL in normal range and within 360 days    LDL Chol Calc (NIH)  Date Value Ref Range Status  11/06/2020 93 0 - 99 mg/dL Final         Passed - HDL in normal range and within 360 days    HDL  Date Value Ref Range Status  11/06/2020 40 >39 mg/dL Final         Passed - Triglycerides in normal range and within 360 days    Triglycerides  Date Value Ref Range Status  11/06/2020 119 0 - 149 mg/dL Final         Passed - Patient is not pregnant      Passed - Valid encounter within last 12 months    Recent Outpatient Visits          1 week ago Type 2 diabetes mellitus with obesity ([place])   [place], Neoma Laming B, MD   3 months ago Type 2 diabetes mellitus with obesity [facility])   [place], [place], [place]   5 months ago Type 2 diabetes mellitus with obesity [facility])   [place], [place], [place]   9 months ago Annual physical exam   [place] Ladell Pier, MD   11 months ago Need for influenza vaccination   [place], RPH-CPP

## 2021-02-28 ENCOUNTER — Other Ambulatory Visit (HOSPITAL_BASED_OUTPATIENT_CLINIC_OR_DEPARTMENT_OTHER): Payer: Self-pay

## 2021-03-17 ENCOUNTER — Other Ambulatory Visit: Payer: Self-pay

## 2021-03-17 ENCOUNTER — Telehealth: Payer: Self-pay

## 2021-03-17 NOTE — Telephone Encounter (Signed)
Attempted to return call to patient  regarding scheduling a BCCCP appointment. Left message on voicemail requesting a return call.

## 2021-03-20 ENCOUNTER — Other Ambulatory Visit: Payer: Self-pay

## 2021-03-24 ENCOUNTER — Other Ambulatory Visit: Payer: Self-pay

## 2021-03-27 ENCOUNTER — Other Ambulatory Visit: Payer: Self-pay

## 2021-03-27 ENCOUNTER — Ambulatory Visit (INDEPENDENT_AMBULATORY_CARE_PROVIDER_SITE_OTHER): Payer: BC Managed Care – PPO | Admitting: Orthopaedic Surgery

## 2021-03-27 ENCOUNTER — Encounter: Payer: Self-pay | Admitting: Orthopaedic Surgery

## 2021-03-27 ENCOUNTER — Ambulatory Visit: Payer: Self-pay

## 2021-03-27 VITALS — Ht 66.0 in | Wt 194.0 lb

## 2021-03-27 DIAGNOSIS — G8929 Other chronic pain: Secondary | ICD-10-CM | POA: Diagnosis not present

## 2021-03-27 DIAGNOSIS — M545 Low back pain, unspecified: Secondary | ICD-10-CM | POA: Diagnosis not present

## 2021-03-27 MED ORDER — METHOCARBAMOL 500 MG PO TABS
500.0000 mg | ORAL_TABLET | Freq: Two times a day (BID) | ORAL | 0 refills | Status: DC | PRN
  Filled 2021-03-27: qty 20, 10d supply, fill #0

## 2021-03-27 MED ORDER — PREDNISONE 5 MG PO TABS
ORAL_TABLET | ORAL | 0 refills | Status: DC
  Filled 2021-03-27: qty 21, 6d supply, fill #0

## 2021-03-27 NOTE — Progress Notes (Signed)
Office Visit Note   Patient: Heather Bailey           Date of Birth: 07/06/1960           MRN: [id number] Visit Date: 03/27/2021              Requested by: Ladell Pier, MD [address] [place],  [place] [address] PCP: Ladell Pier, MD   Assessment & Plan: Visit Diagnoses:  1. Chronic bilateral low back pain, unspecified whether sciatica present     Plan: Impression is chronic left low back pain with left lower extremity radiculopathy.  We have discussed starting the patient on a steroid taper and muscle relaxer as well as reinstituting physical therapy as she had good relief in the past.  If her symptoms worsen or do not improve after 6 to 8 weeks, she will let us know we will order an updated MRI of the lumbar spine.  Otherwise, follow-up with [place] as needed.  Follow-Up Instructions: Return if symptoms worsen or fail to improve.   Orders:  Orders Placed This Encounter  Procedures   XR Lumbar Spine 2-3 Views   Ambulatory referral to Physical Therapy   Meds ordered this encounter  Medications   predniSONE (STERAPRED UNI-PAK 21 TAB) 5 MG (21) TBPK tablet    Sig: Take as directed    Dispense:  21 tablet    Refill:  0   methocarbamol (ROBAXIN) 500 MG tablet    Sig: Take 1 tablet (500 mg total) by mouth 2 (two) times daily as needed.    Dispense:  20 tablet    Refill:  0      Procedures: No procedures performed   Clinical Data: No additional findings.   Subjective: Chief Complaint  Patient presents with   Lower Back - Pain    HPI patient is a pleasant 60-year-old female who comes in today with 20 years of bilateral low back pain left greater than right.  The pain she has also radiates down the left lower extremity.  She denies any weakness to either lower extremity.  Pain is worse with sleeping as well as when she is sitting all day at her desk.  She already uses Biofreeze without significant relief.  She does note numbness and tingling down the left leg.   No bowel or bladder change.  She has not previously undergone epidural steroid injections or had surgical intervention.  She did go to 12 weeks of therapy about 10 to 15 years ago which did help.  Review of Systems as detailed in HPI.  All others reviewed and are negative.   Objective: Vital Signs: Ht 5\' 6"  (1.676 m)   Wt 194 lb (88 kg)   BMI 31.31 kg/m   Physical Exam well-developed well-nourished female no acute distress.  Alert and oriented x3.  Ortho Exam lumbar spine exam shows no spinous tenderness.  She has mild left-sided paraspinous tenderness she has increased pain with lumbar flexion, extension and rotation.  Negative straight leg raise.  No focal weakness.  She is neurovascular intact distally.  Specialty Comments:  No specialty comments available.  Imaging: No results found.   PMFS History: Patient Active Problem List   Diagnosis Date Noted   Hyperlipidemia associated with type 2 diabetes mellitus ([place]) 11/03/2019   Class 1 obesity due to excess calories with serious comorbidity and body mass index (BMI) of 32.0 to 32.9 in adult 11/03/2019   Floaters in visual field, right 11/15/2018  Onychomycosis 08/15/2018   Achilles tendinitis of right lower extremity 08/15/2018   Hot flashes 02/23/2017   Chronic low back pain 04/12/2015   Dry skin 04/12/2015   Chronic dryness of both eyes 08/28/2014   DM2 (diabetes mellitus, type 2) ([place]) 03/19/2014   Hypothyroidism 03/19/2014   Hypertension 03/19/2014   Hyperlipidemia 03/19/2014   Depression 03/19/2014   Past Medical History:  Diagnosis Date   Anemia Dx 2003   Arthritis Dx 1999   DVT (deep venous thrombosis) ([place])    Fibroids    Gestational diabetes    Hyperlipidemia Dx 2000   Hypertension Dx 2000   Hypertensive retinopathy    Thyroid disease Dx 2005    Family History  Problem Relation Age of Onset   Hypertension Mother    Cancer Mother        colon/cervical   Alzheimer's disease Sister     Past Surgical  History:  Procedure Laterality Date   CESAREAN SECTION     MYOMECTOMY     TUBAL LIGATION     Social History   Occupational History   Not on file  Tobacco Use   Smoking status: Never   Smokeless tobacco: Never  Vaping Use   Vaping Use: Never used  Substance and Sexual Activity   Alcohol use: No   Drug use: No   Sexual activity: Not Currently    Birth control/protection: Surgical

## 2021-03-31 ENCOUNTER — Other Ambulatory Visit: Payer: Self-pay | Admitting: Internal Medicine

## 2021-03-31 DIAGNOSIS — E1169 Type 2 diabetes mellitus with other specified complication: Secondary | ICD-10-CM

## 2021-03-31 DIAGNOSIS — F32A Anxiety disorder, unspecified: Secondary | ICD-10-CM

## 2021-03-31 DIAGNOSIS — F419 Anxiety disorder, unspecified: Secondary | ICD-10-CM

## 2021-03-31 DIAGNOSIS — I10 Essential (primary) hypertension: Secondary | ICD-10-CM

## 2021-03-31 DIAGNOSIS — E669 Obesity, unspecified: Secondary | ICD-10-CM

## 2021-03-31 NOTE — Telephone Encounter (Signed)
New pharmacy request- remainder of RF sent to mail order pharmacy.  Requested Prescriptions  Pending Prescriptions Disp Refills  . sertraline (ZOLOFT) 100 MG tablet [Pharmacy Med Name: SERTRALINE HCL TABS 100MG] 90 tablet 0    Sig: TAKE 1 TABLET DAILY     Psychiatry:  Antidepressants - SSRI Passed - 03/31/2021 12:39 AM      Passed - Completed PHQ-2 or PHQ-9 in the last 360 days      Passed - Valid encounter within last 6 months    Recent Outpatient Visits          1 month ago Type 2 diabetes mellitus with obesity (HCC)   [facility] Johnson, Deborah B, MD   4 months ago Type 2 diabetes mellitus with obesity (HCC)   [facility] McClung, Angela M, PA-C   6 months ago Type 2 diabetes mellitus with obesity (HCC)   [facility] McCl[facility], Angela M, PA-C   11 months ago Annual physical exam   [facility] Johnson, Deborah B, MD   1 year ago Need for influenza vaccination   [facility] Van Ausdall, Stephen L, RPH-CPP             . metoprolol succinate (TOPROL-XL) 25 MG 24 hr tablet [Pharmacy Med Name: METOPROLOL SUCCINATE ER TABS 25MG] 90 tablet 1    Sig: TAKE 1 TABLET DAILY     Cardiovascular:  Beta Blockers Passed - 03/31/2021 12:39 AM      Passed - Last BP in normal range    BP Readings from Last 1 Encounters:  02/06/21 120/84         Passed - Last Heart Rate in normal range    Pulse Readings from Last 1 Encounters:  02/06/21 63         Passed - Valid encounter within last 6 months    Recent Outpatient Visits          1 month ago Type 2 diabetes mellitus with obesity (HCC)   [facility] Johnson, Deborah B, MD   4 months ago Type 2 diabetes mellitus with obesity (HCC)   [facility] McClung, Angela M, PA-C   6 months ago Type 2 diabetes mellitus with obesity (HCC)    [facility] McClung, Angela M, PA-C   11 months ago Annual physical exam   [facility] Johnson, Deborah B, MD   1 year ago Need for influenza vaccination    [facility], Stephen L, RPH-CPP             . losartan (COZAAR) 25 MG tablet [Pharmacy Med Name: LOSARTAN TABS 25MG] 45 tablet 1    Sig: TAKE ONE-HALF (1/2) TABLET DAILY     Cardiovascular:  Angiotensin Receptor Blockers Failed - 03/31/2021 12:39 AM      Failed - Cr in normal range and within 180 days    Creat  Date Value Ref Range Status  12/19/2015 1.10 (H) 0.50 - 1.05 mg/dL Final    Comment:      For patients > or = 60 years of age: The upper reference limit for Creatinine is approximately 13% higher for people identified as African-American.      Creatinine, Ser  Date Value Ref Range Status  11/06/2020 1.17 (H) 0.57 - 1.00   mg/dL Final   Creatinine, Urine  Date Value Ref Range Status  08/21/2016 101 20 - 320 mg/dL Final         Passed - K in normal range and within 180 days    Potassium  Date Value Ref Range Status  11/06/2020 4.4 3.5 - 5.2 mmol/L Final         Passed - Patient is not pregnant      Passed - Last BP in normal range    BP Readings from Last 1 Encounters:  02/06/21 120/84         Passed - Valid encounter within last 6 months    Recent Outpatient Visits          1 month ago Type 2 diabetes mellitus with obesity (HCC)   [facility] Johnson, Deborah B, MD   4 months ago Type 2 diabetes mellitus with obesity (HCC)   [facility] McClung, Angela M, PA-C   6 months ago Type 2 diabetes mellitus with obesity (HCC)   [facility] McClung, Angela M, PA-C   11 months ago Annual physical exam   [facility] Johnson, Deborah B, MD   1 year ago Need for influenza vaccination   [facility] Van Ausdall, Stephen L, RPH-CPP             . metFORMIN (GLUCOPHAGE-XR) 500 MG 24 hr tablet [Pharmacy Med Name: METFORMIN HCL ER TABS 500MG] 180 tablet 1    Sig: TAKE 2 TABLETS DAILY     Endocrinology:  Diabetes - Biguanides Failed - 03/31/2021 12:39 AM      Failed - Cr in normal range and within 360 days    Creat  Date Value Ref Range Status  12/19/2015 1.10 (H) 0.50 - 1.05 mg/dL Final    Comment:      For patients > or = 60 years of age: The upper reference limit for Creatinine is approximately 13% higher for people identified as African-American.      Creatinine, Ser  Date Value Ref Range Status  11/06/2020 1.17 (H) 0.57 - 1.00 mg/dL Final   Creatinine, Urine  Date Value Ref Range Status  08/21/2016 101 20 - 320 mg/dL Final         Passed - HBA1C is between 0 and 7.9 and within 180 days    HbA1c, POC (prediabetic range)  Date Value Ref Range Status  08/15/2018 6.1 5.7 - 6.4 % Final   Hgb A1c MFr Bld  Date Value Ref Range Status  02/06/2021 6.3 (H) 4.8 - 5.6 % Final    Comment:             Prediabetes: 5.7 - 6.4          Diabetes: >6.4          Glycemic control for adults with diabetes: <7.0          Passed - AA eGFR in normal range and within 360 days    GFR, Est African American  Date Value Ref Range Status  12/19/2015 65 >=60 mL/min Final   GFR calc Af Amer  Date Value Ref Range Status  05/03/2020 60 >59 mL/min/1.73 Final    Comment:    **In accordance with recommendations from the NKF-ASN Task force,**   [facility] is in the process of updating its eGFR calculation to the   2021 CKD-EPI creatinine equation   that estimates kidney function   without a race variable.    GFR, Est Non African American  Date Value Ref Range Status  12/19/2015 57 (L) >=60 mL/min Final   GFR calc non Af Amer  Date Value Ref Range Status  05/03/2020 52 (L) >59 mL/min/1.73 Final   eGFR  Date Value Ref Range Status  11/06/2020 53 (L)  >59 mL/min/1.73 Final         Passed - Valid encounter within last 6 months    Recent Outpatient Visits          1 month ago Type 2 diabetes mellitus with obesity (HCC)   [facility] Johnson, Deborah B, MD   4 months ago Type 2 diabetes mellitus with obesity (HCC)   [place] McClung, Angela M, PA-C   6 months ago Type 2 diabetes mellitus with obesity (HCC)   [place] [facility] McClung, Angela M, PA-C   11 months ago Annual physical exam   [facility] Johnson, Deborah B, MD   1 year ago Need for influenza vaccination    [facility] [place], Stephen L, RPH-CPP              

## 2021-04-02 ENCOUNTER — Other Ambulatory Visit: Payer: Self-pay

## 2021-04-02 ENCOUNTER — Ambulatory Visit (INDEPENDENT_AMBULATORY_CARE_PROVIDER_SITE_OTHER): Payer: BC Managed Care – PPO | Admitting: Physical Therapy

## 2021-04-02 ENCOUNTER — Encounter: Payer: Self-pay | Admitting: Physical Therapy

## 2021-04-02 DIAGNOSIS — R293 Abnormal posture: Secondary | ICD-10-CM | POA: Diagnosis not present

## 2021-04-02 DIAGNOSIS — M545 Low back pain, unspecified: Secondary | ICD-10-CM

## 2021-04-02 DIAGNOSIS — G8929 Other chronic pain: Secondary | ICD-10-CM | POA: Diagnosis not present

## 2021-04-02 DIAGNOSIS — N83209 Unspecified ovarian cyst, unspecified side: Secondary | ICD-10-CM | POA: Diagnosis not present

## 2021-04-02 DIAGNOSIS — Z124 Encounter for screening for malignant neoplasm of cervix: Secondary | ICD-10-CM | POA: Diagnosis not present

## 2021-04-02 DIAGNOSIS — M6281 Muscle weakness (generalized): Secondary | ICD-10-CM | POA: Diagnosis not present

## 2021-04-02 DIAGNOSIS — Z01411 Encounter for gynecological examination (general) (routine) with abnormal findings: Secondary | ICD-10-CM | POA: Diagnosis not present

## 2021-04-02 DIAGNOSIS — N841 Polyp of cervix uteri: Secondary | ICD-10-CM | POA: Diagnosis not present

## 2021-04-02 NOTE — Patient Instructions (Signed)
Access Code: [email or web address] URL: [email or web address] Date: 04/02/2021 Prepared by: Faustino Congress  Exercises Supine Bridge - 2 x daily - 7 x weekly - 1 sets - 10 reps - 5 sec hold Sidelying Hip Abduction - 2 x daily - 7 x weekly - 1 sets - 10 reps Supine Piriformis Stretch with Foot on Ground - 2 x daily - 7 x weekly - 1 sets - 3 reps - 30 sec hold Hooklying Single Knee to Chest - 2 x daily - 7 x weekly - 1 sets - 3 reps - 30 sec hold Child's Pose Stretch - 2 x daily - 7 x weekly - 1 sets - 10 reps Cat Cow - 2 x daily - 7 x weekly - 1 sets - 10 reps

## 2021-04-02 NOTE — Therapy (Signed)
[facility] Physical Therapy [address] [place], [place], [address] Phone: [phone]   Fax:  [phone]  Physical Therapy Evaluation  Patient Details  Name: Heather Bailey MRN: [id number] Date of Birth: Aug 16, 1960 Referring Provider (PT): Aundra Dubin, [place]   Encounter Date: 04/02/2021   PT End of Session - 04/02/21 1553     Visit Number 1    Number of Visits 6    Date for PT Re-Evaluation 05/14/21    Authorization Type BCBS    PT Start Time 1516    PT Stop Time 0177    PT Time Calculation (min) 36 min    Activity Tolerance Patient tolerated treatment well    Behavior During Therapy [facility] for tasks assessed/performed             Past Medical History:  Diagnosis Date   Anemia Dx 2003   Arthritis Dx 1999   DVT (deep venous thrombosis) ([place])    Fibroids    Gestational diabetes    Hyperlipidemia Dx 2000   Hypertension Dx 2000   Hypertensive retinopathy    Thyroid disease Dx 2005    Past Surgical History:  Procedure Laterality Date   CESAREAN SECTION     MYOMECTOMY     TUBAL LIGATION      There were no vitals filed for this visit.    Subjective Assessment - 04/02/21 1519     Subjective Pt is a 60 y/o female who presents to OPPT for chronic LBP.  She reports ~ 20 years ago she was dx with bulging disc and declined surgery.  She reports pain has gotten progressively worse, and expresses difficulty with prolonged sitting.  She does report one fall about 2 years ago hitting back on sofa.    Pertinent History HLD, DM, HTN, depression, DVT with PE    Limitations Sitting    How long can you sit comfortably? sits for hours at a time    Diagnostic tests arthritis    Patient Stated Goals improve pain    Currently in Pain? Yes    Pain Score 8    up to 10/10; at best 5/10   Pain Location Back    Pain Orientation Left;Right;Lower   Lt > Rt   Pain Descriptors / Indicators Burning;Constant;Throbbing    Pain Type Chronic pain    Pain Onset  More than a month ago    Pain Frequency Constant    Aggravating Factors  sitting    Pain Relieving Factors massage gun, stretches                [facility] PT Assessment - 04/02/21 1506       Assessment   Medical Diagnosis M54.50,G89.29 (ICD-10-CM) - Chronic bilateral low back pain, unspecified whether sciatica present    Referring Provider (PT) Aundra Dubin, PA-C    Onset Date/Surgical Date --   chronic   Hand Dominance Right    Next MD Visit PRN    Prior Therapy many years ago      Precautions   Precautions None      Restrictions   Weight Bearing Restrictions No      Balance Screen   Has the patient fallen in the past 6 months No    Has the patient had a decrease in activity level because of a fear of falling?  No    Is the patient reluctant to leave their home because of a fear of falling?  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, seated computer work, Geophysical data processor every weekend    Leisure traveling, movies, board games, crafting; 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)      Cognition   Overall Cognitive Status Within Functional Limits for tasks assessed      Observation/Other Assessments   Focus on Therapeutic Outcomes (FOTO)  52 (predicted 58)      Posture/Postural Control   Posture/Postural Control Postural limitations    Postural Limitations Rounded Shoulders;Forward  head;Increased lumbar lordosis      ROM / Strength   AROM / PROM / Strength AROM;Strength      AROM   AROM Assessment Site Lumbar    Lumbar Flexion limited 25%    Lumbar Extension WNL    Lumbar - Right Side Bend limited 25% with hip pain    Lumbar - Left Side Bend limited 25% with hip pain    Lumbar - Right Rotation WNL - with catch in back    Lumbar - Left Rotation WNL- with catch in back      Strength   Strength Assessment Site Hip;Knee    Right/Left Hip Right;Left    Right Hip Flexion 4/5    Right Hip Extension 3/5    Right Hip ABduction 3/5    Left Hip Flexion 5/5    Left Hip Extension 3/5    Left Hip ABduction 3/5    Right/Left Knee Right;Left    Right Knee Flexion 5/5    Right Knee Extension 5/5    Left Knee Flexion 5/5    Left Knee Extension 5/5      Flexibility   Soft Tissue Assessment /Muscle Length yes    Piriformis tightness bil      Palpation   Spinal mobility mild hypomobility    Palpation comment tenderness over Lt glute/piriformis      Special Tests    Special Tests Lumbar    Lumbar Tests Slump Test      Slump test   Findings Negative                        Objective measurements completed on examination: See above findings.       [facility] Adult PT Treatment/Exercise - 04/02/21 1506       Exercises   Exercises Other Exercises    Other Exercises  see pt instructions - performed and reviewed PRN for understanding                     PT Education - 04/02/21 1509     Education Details HEP    Person(s) Educated Patient    Methods Explanation;Demonstration;Handout    Comprehension Verbalized understanding;Returned demonstration;Need further instruction              PT  Short Term Goals - 04/02/21 1510       PT SHORT TERM GOAL #1   Title independent with initial HEP    Time 3    Period Weeks    Status New    Target Date 04/23/21               PT Long Term Goals - 04/02/21 1510       PT LONG TERM  GOAL #1   Title independent with final HEP    Time 6    Period Weeks    Status New    Target Date 05/14/21      PT LONG TERM GOAL #2   Title FOTO score improved to 58 for improved function    Time 6    Period Weeks    Status New    Target Date 05/14/21      PT LONG TERM GOAL #3   Title report pain < 3/10 with activity for improved function    Time 6    Period Weeks    Status New    Target Date 05/14/21      PT LONG TERM GOAL #4   Title demonstrate 4/5 bil hip strength for improved function    Time 6    Period Weeks    Status New    Target Date 05/14/21      PT LONG TERM GOAL #5   Title perform lumbar ROM without increase in pain for improved function    Time 6    Period Weeks    Status New    Target Date 05/14/21                    Plan - 04/02/21 1509     Clinical Impression Statement Pt is a 60 y/o female who presents to OPPT for chronic LBP without known injury.  Pt demonstrates decreased ROM and strength as well as decreased flexibility and active trigger points.  Will benefit from PT to address deficits listed.    Personal Factors and Comorbidities Comorbidity 3+    Comorbidities HLD, DM, HTN, depression, DVT, PE    Examination-Activity Limitations Transfers;Bed Mobility;Sit;Sleep;Lift;Stand    Examination-Participation Restrictions Cleaning;Meal Prep;Community Activity;Occupation;Driving    Stability/Clinical Decision Making Evolving/Moderate complexity    Clinical Decision Making Moderate    Rehab Potential Good    PT Frequency 1x / week    PT Duration 6 weeks    PT Treatment/Interventions ADLs/Self Care Home Management;Cryotherapy;Electrical Stimulation;Traction;Moist Heat;Stair training;Gait training;Functional mobility training;Therapeutic activities;Therapeutic exercise;Neuromuscular re-education;Patient/family education;Manual techniques;Dry needling;Taping    PT Next Visit Plan review HEP, hip/core strengthening, general endurance/mobility.   manual/modalities/DN PRN    PT Home Exercise Plan Access Code: [email or web address]    Consulted and Agree with Plan of Care Patient             Patient will benefit from skilled therapeutic intervention in order to improve the following deficits and impairments:  Pain, Increased muscle spasms, Increased fascial restricitons, Decreased strength, Decreased range of motion, Impaired flexibility, Hypomobility, Postural dysfunction  Visit Diagnosis: Chronic bilateral low back pain, unspecified whether sciatica present - Plan: PT plan of care cert/re-cert  Muscle weakness (generalized) - Plan: PT plan of care cert/re-cert  Abnormal posture - Plan: PT plan of care cert/re-cert     Problem List Patient Active Problem List   Diagnosis Date Noted   Hyperlipidemia associated with type 2 diabetes mellitus ([place]) 11/03/2019   Class 1 obesity due to excess calories with  serious comorbidity and body mass index (BMI) of 32.0 to 32.9 in adult 11/03/2019   Floaters in visual field, right 11/15/2018   Onychomycosis 08/15/2018   Achilles tendinitis of right lower extremity 08/15/2018   Hot flashes 02/23/2017   Chronic low back pain 04/12/2015   Dry skin 04/12/2015   Chronic dryness of both eyes 08/28/2014   DM2 (diabetes mellitus, type 2) ([place]) 03/19/2014   Hypothyroidism 03/19/2014   Hypertension 03/19/2014   Hyperlipidemia 03/19/2014   Depression 03/19/2014      Laureen Abrahams, PT, DPT 04/02/21 3:58 PM     [facility] [address] [place], [place], [address] Phone: [phone]   Fax:  [phone]  Name: Conchetta Lamia MRN: [id number] Date of Birth: 03/24/1961

## 2021-04-09 ENCOUNTER — Other Ambulatory Visit: Payer: Self-pay | Admitting: Internal Medicine

## 2021-04-09 DIAGNOSIS — E039 Hypothyroidism, unspecified: Secondary | ICD-10-CM

## 2021-04-09 NOTE — Telephone Encounter (Signed)
Medication Refill - Medication:  levothyroxine (SYNTHROID) 50 MCG tablet   Has the patient contacted their pharmacy? No. Pt called on behalf of pt  Preferred Pharmacy (with phone number or street name):  [place] [place] Phone:  [phone]  Fax:  [phone]     Has the patient been seen for an appointment in the last year OR does the patient have an upcoming appointment? Yes.    Agent: Please be advised that RX refills may take up to 3 business days. We ask that you follow-up with your pharmacy.

## 2021-04-09 NOTE — Telephone Encounter (Signed)
Requested medications are due for refill today.  yes  Requested medications are on the active medications list.  yes  Last refill. 11/06/2020  Future visit scheduled.   no  Notes to clinic.  Abnormal labs. Pt was to return 6 weeks after abnormal labs of 02/06/2021.

## 2021-04-10 MED ORDER — LEVOTHYROXINE SODIUM 50 MCG PO TABS: 50.0000 ug | ORAL_TABLET | Freq: Every day | ORAL | 0 refills | Status: DC

## 2021-04-17 ENCOUNTER — Ambulatory Visit (INDEPENDENT_AMBULATORY_CARE_PROVIDER_SITE_OTHER): Payer: BC Managed Care – PPO | Admitting: Rehabilitative and Restorative Service Providers"

## 2021-04-17 ENCOUNTER — Other Ambulatory Visit: Payer: Self-pay

## 2021-04-17 ENCOUNTER — Encounter: Payer: Self-pay | Admitting: Rehabilitative and Restorative Service Providers"

## 2021-04-17 DIAGNOSIS — M545 Low back pain, unspecified: Secondary | ICD-10-CM | POA: Diagnosis not present

## 2021-04-17 DIAGNOSIS — G8929 Other chronic pain: Secondary | ICD-10-CM | POA: Diagnosis not present

## 2021-04-17 DIAGNOSIS — R293 Abnormal posture: Secondary | ICD-10-CM

## 2021-04-17 DIAGNOSIS — M6281 Muscle weakness (generalized): Secondary | ICD-10-CM

## 2021-04-17 NOTE — Therapy (Signed)
[facility] [address] [place], [place], [address] Phone: [phone]   Fax:  [phone]  Physical Therapy Treatment  Patient Details  Name: Heather Bailey MRN: [id number] Date of Birth: [birth year] Referring Provider (PT): Aundra Dubin, [place]   Encounter Date: 04/17/2021   PT End of Session - 04/17/21 0828     Visit Number 2    Number of Visits 6    Date for PT Re-Evaluation 05/14/21    Authorization Type BCBS    PT Start Time 0802    PT Stop Time 8841    PT Time Calculation (min) 39 min    Activity Tolerance Patient tolerated treatment well    Behavior During Therapy [facility] for tasks assessed/performed             Past Medical History:  Diagnosis Date   Anemia Dx 2003   Arthritis Dx 1999   DVT (deep venous thrombosis) ([place])    Fibroids    Gestational diabetes    Hyperlipidemia Dx 2000   Hypertension Dx 2000   Hypertensive retinopathy    Thyroid disease Dx 2005    Past Surgical History:  Procedure Laterality Date   CESAREAN SECTION     MYOMECTOMY     TUBAL LIGATION      There were no vitals filed for this visit.   Subjective Assessment - 04/17/21 0808     Subjective Pt. indicated 4/10 or so on Lt lumbar.  Pt. indicated no specific complaints with performance of HEP.  Pt. indicated overall trending better.    Pertinent History HLD, DM, HTN, depression, DVT with PE    Limitations Sitting    How long can you sit comfortably? sits for hours at a time    Diagnostic tests arthritis    Patient Stated Goals improve pain    Currently in Pain? Yes    Pain Score 4     Pain Location Back    Pain Orientation Left    Pain Type Chronic pain    Pain Onset More than a month ago    Pain Frequency Intermittent    Aggravating Factors  turning the wrong way yesterday    Pain Relieving Factors stretching                [facility] PT Assessment - 04/17/21 0001       Assessment   Medical Diagnosis M54.50,G89.29 (ICD-10-CM) -  Chronic bilateral low back pain, unspecified whether sciatica present    Referring Provider (PT) Aundra Dubin, PA-C      AROM   Lumbar Extension 75 % WFL c mild Lt lumbar pain.  Repeated 5x in standing: improved to 100%, reduced symptoms                           [facility] Adult PT Treatment/Exercise - 04/17/21 0001       Exercises   Exercises Lumbar    Other Exercises  HEP review      Lumbar Exercises: Stretches   Single Knee to Chest Stretch 30 seconds;2 reps;Right;Left    Standing Extension 2 reps;5 reps    Other Lumbar Stretch Exercise single knee to opposite shoulder 30 sec x 2 bilateral      Lumbar Exercises: Aerobic   Nustep Lvl 6 7 mins      Lumbar Exercises: Supine   Bridge 5 seconds;10 reps  PT Education - 04/17/21 0809     Education Details HEP review    Person(s) Educated Patient    Methods Explanation;Demonstration;Verbal cues    Comprehension Returned demonstration;Verbalized understanding              PT Short Term Goals - 04/17/21 0812       PT SHORT TERM GOAL #1   Title independent with initial HEP    Time 3    Period Weeks    Status On-going    Target Date 04/23/21               PT Long Term Goals - 04/02/21 1510       PT LONG TERM GOAL #1   Title independent with final HEP    Time 6    Period Weeks    Status New    Target Date 05/14/21      PT LONG TERM GOAL #2   Title FOTO score improved to 58 for improved function    Time 6    Period Weeks    Status New    Target Date 05/14/21      PT LONG TERM GOAL #3   Title report pain < 3/10 with activity for improved function    Time 6    Period Weeks    Status New    Target Date 05/14/21      PT LONG TERM GOAL #4   Title demonstrate 4/5 bil hip strength for improved function    Time 6    Period Weeks    Status New    Target Date 05/14/21      PT LONG TERM GOAL #5   Title perform lumbar ROM without increase in pain for  improved function    Time 6    Period Weeks    Status New    Target Date 05/14/21                   Plan - 04/17/21 0827     Clinical Impression Statement Today spent in review and technique adjustment at times for HEP to improve knowledge and performance.  Pt. continued to show response from movements to promote improved mobility within lumbar and hip motion.    Personal Factors and Comorbidities Comorbidity 3+    Comorbidities HLD, DM, HTN, depression, DVT, PE    Examination-Activity Limitations Transfers;Bed Mobility;Sit;Sleep;Lift;Stand    Examination-Participation Restrictions Cleaning;Meal Prep;Community Activity;Occupation;Driving    Stability/Clinical Decision Making Evolving/Moderate complexity    Rehab Potential Good    PT Frequency 1x / week    PT Duration 6 weeks    PT Treatment/Interventions ADLs/Self Care Home Management;Cryotherapy;Electrical Stimulation;Traction;Moist Heat;Stair training;Gait training;Functional mobility training;Therapeutic activities;Therapeutic exercise;Neuromuscular re-education;Patient/family education;Manual techniques;Dry needling;Taping    PT Next Visit Plan hip/core strengthening, general endurance/mobility.  Possible neck/upper thoracic movement for HEP (Pt. report of neck stiffness at times)    PT Home Exercise Plan Access Code: [email or web address]    Consulted and Agree with Plan of Care Patient             Patient will benefit from skilled therapeutic intervention in order to improve the following deficits and impairments:  Pain, Increased muscle spasms, Increased fascial restricitons, Decreased strength, Decreased range of motion, Impaired flexibility, Hypomobility, Postural dysfunction  Visit Diagnosis: Chronic bilateral low back pain, unspecified whether sciatica present  Muscle weakness (generalized)  Abnormal posture     Problem List Patient Active Problem List   Diagnosis Date Noted   Hyperlipidemia associated  with type 2  diabetes mellitus ([place]) 11/03/2019   Class 1 obesity due to excess calories with serious comorbidity and body mass index (BMI) of 32.0 to 32.9 in adult 11/03/2019   Floaters in visual field, right 11/15/2018   Onychomycosis 08/15/2018   Achilles tendinitis of right lower extremity 08/15/2018   Hot flashes 02/23/2017   Chronic low back pain 04/12/2015   Dry skin 04/12/2015   Chronic dryness of both eyes 08/28/2014   DM2 (diabetes mellitus, type 2) ([place]) 03/19/2014   Hypothyroidism 03/19/2014   Hypertension 03/19/2014   Hyperlipidemia 03/19/2014   Depression 03/19/2014    Scot Jun, PT, DPT, OCS, ATC 04/17/21  8:39 AM    [facility] [address] [place], [place], [address] Phone: [phone]   Fax:  [phone]  Name: English Tomer MRN: [id number] Date of Birth: [birth year]

## 2021-04-23 ENCOUNTER — Ambulatory Visit (INDEPENDENT_AMBULATORY_CARE_PROVIDER_SITE_OTHER): Payer: BC Managed Care – PPO | Admitting: Physical Therapy

## 2021-04-23 ENCOUNTER — Other Ambulatory Visit: Payer: Self-pay

## 2021-04-23 DIAGNOSIS — R293 Abnormal posture: Secondary | ICD-10-CM

## 2021-04-23 DIAGNOSIS — M545 Low back pain, unspecified: Secondary | ICD-10-CM

## 2021-04-23 DIAGNOSIS — M6281 Muscle weakness (generalized): Secondary | ICD-10-CM | POA: Diagnosis not present

## 2021-04-23 DIAGNOSIS — G8929 Other chronic pain: Secondary | ICD-10-CM

## 2021-04-23 NOTE — Therapy (Signed)
[facility] [address] [place], [place], [address] Phone: [phone]   Fax:  [phone]  Physical Therapy Treatment  Patient Details  Name: Heather Bailey MRN: [id number] Date of Birth: [birth year] Referring Provider (PT): Aundra Dubin, [place]   Encounter Date: 04/23/2021   PT End of Session - 04/23/21 1100     Visit Number 3    Number of Visits 6    Date for PT Re-Evaluation 05/14/21    Authorization Type BCBS    PT Start Time 45    PT Stop Time 3846    PT Time Calculation (min) 45 min    Activity Tolerance Patient tolerated treatment well    Behavior During Therapy [facility] for tasks assessed/performed             Past Medical History:  Diagnosis Date   Anemia Dx 2003   Arthritis Dx 1999   DVT (deep venous thrombosis) ([place])    Fibroids    Gestational diabetes    Hyperlipidemia Dx 2000   Hypertension Dx 2000   Hypertensive retinopathy    Thyroid disease Dx 2005    Past Surgical History:  Procedure Laterality Date   CESAREAN SECTION     MYOMECTOMY     TUBAL LIGATION      There were no vitals filed for this visit.   Subjective Assessment - 04/23/21 1102     Subjective Pt reports nothing new or different. She feels she is on track with her PT. Pt state she's been able to do some exercises daily.    Pertinent History HLD, DM, HTN, depression, DVT with PE    Limitations Sitting    How long can you sit comfortably? sits for hours at a time    Diagnostic tests arthritis    Patient Stated Goals improve pain    Currently in Pain? Yes    Pain Score 5     Pain Location Back    Pain Orientation Left    Pain Type Chronic pain    Pain Onset More than a month ago    Pain Frequency Intermittent                               [facility] Adult PT Treatment/Exercise - 04/23/21 0001       Lumbar Exercises: Stretches   Single Knee to Chest Stretch 2 reps;Right;Left;60 seconds    Standing Extension 10 reps     Other Lumbar Stretch Exercise L LE off EOB in slightly adduction and modified thomas x2 min      Lumbar Exercises: Aerobic   Nustep L6 x 5 min      Lumbar Exercises: Standing   Shoulder Extension Strengthening;20 reps;Theraband    Theraband Level (Shoulder Extension) Level 4 (Blue)    Other Standing Lumbar Exercises Palloff press blue tband 2x10      Lumbar Exercises: Supine   Bridge 20 reps    Bridge with March --   3x3     Lumbar Exercises: Sidelying   Hip Abduction Left;20 reps      Manual Therapy   Manual Therapy Joint mobilization    Manual therapy comments in prone: gentle anterior rotation of L ilium x2 min                       PT Short Term Goals - 04/17/21 0812       PT SHORT TERM  GOAL #1   Title independent with initial HEP    Time 3    Period Weeks    Status On-going    Target Date 04/23/21               PT Long Term Goals - 04/02/21 1510       PT LONG TERM GOAL #1   Title independent with final HEP    Time 6    Period Weeks    Status New    Target Date 05/14/21      PT LONG TERM GOAL #2   Title FOTO score improved to 58 for improved function    Time 6    Period Weeks    Status New    Target Date 05/14/21      PT LONG TERM GOAL #3   Title report pain < 3/10 with activity for improved function    Time 6    Period Weeks    Status New    Target Date 05/14/21      PT LONG TERM GOAL #4   Title demonstrate 4/5 bil hip strength for improved function    Time 6    Period Weeks    Status New    Target Date 05/14/21      PT LONG TERM GOAL #5   Title perform lumbar ROM without increase in pain for improved function    Time 6    Period Weeks    Status New    Target Date 05/14/21                   Plan - 04/23/21 1106     Clinical Impression Statement Treatment focused on continuing to improve lumbar and hip mobility and strength. Pt found to have posteriorly rotated L ilium this session -- provided gentle manual  therapy to encourage anterior rotation and stretches. Pt reports improvement of pain from 5 to 3 by end of session. Updated HEP to progress her exercises.    Personal Factors and Comorbidities Comorbidity 3+    Comorbidities HLD, DM, HTN, depression, DVT, PE    Examination-Activity Limitations Transfers;Bed Mobility;Sit;Sleep;Lift;Stand    Examination-Participation Restrictions Cleaning;Meal Prep;Community Activity;Occupation;Driving    Stability/Clinical Decision Making Evolving/Moderate complexity    Rehab Potential Good    PT Frequency 1x / week    PT Duration 6 weeks    PT Treatment/Interventions ADLs/Self Care Home Management;Cryotherapy;Electrical Stimulation;Traction;Moist Heat;Stair training;Gait training;Functional mobility training;Therapeutic activities;Therapeutic exercise;Neuromuscular re-education;Patient/family education;Manual techniques;Dry needling;Taping    PT Next Visit Plan hip/core strengthening, general endurance/mobility.  Possible neck/upper thoracic movement for HEP (Pt. report of neck stiffness at times)    PT Home Exercise Plan Access Code: [email or web address]    Consulted and Agree with Plan of Care Patient             Patient will benefit from skilled therapeutic intervention in order to improve the following deficits and impairments:  Pain, Increased muscle spasms, Increased fascial restricitons, Decreased strength, Decreased range of motion, Impaired flexibility, Hypomobility, Postural dysfunction  Visit Diagnosis: Chronic bilateral low back pain, unspecified whether sciatica present  Muscle weakness (generalized)  Abnormal posture     Problem List Patient Active Problem List   Diagnosis Date Noted   Hyperlipidemia associated with type 2 diabetes mellitus ([place]) 11/03/2019   Class 1 obesity due to excess calories with serious comorbidity and body mass index (BMI) of 32.0 to 32.9 in adult 11/03/2019   Floaters in visual field, right 11/15/2018   Onychomycosis  08/15/2018   Achilles tendinitis of right lower extremity 08/15/2018   Hot flashes 02/23/2017   Chronic low back pain 04/12/2015   Dry skin 04/12/2015   Chronic dryness of both eyes 08/28/2014   DM2 (diabetes mellitus, type 2) ([place]) 03/19/2014   Hypothyroidism 03/19/2014   Hypertension 03/19/2014   Hyperlipidemia 03/19/2014   Depression 03/19/2014    [facility] April Gordy Levan, PT, DPT 04/23/2021, 11:56 AM  [facility] [address] [place], [place], [address] Phone: [phone]   Fax:  [phone]  Name: Heather Bailey MRN: [id number] Date of Birth: [birth year]

## 2021-04-23 NOTE — Patient Instructions (Addendum)
  On the back of the left pelvis, gently push down at and at a slight angle. Hold for 2 min.    Place yourself at the very edge of the bottom of your bed. Let the leg left drop off at an angle.  Hold for 2 min. Attempt 2x/day for 3 days.

## 2021-04-24 ENCOUNTER — Encounter: Payer: BC Managed Care – PPO | Admitting: Rehabilitative and Restorative Service Providers"

## 2021-04-30 DIAGNOSIS — N83209 Unspecified ovarian cyst, unspecified side: Secondary | ICD-10-CM | POA: Diagnosis not present

## 2021-05-01 ENCOUNTER — Encounter: Payer: Self-pay | Admitting: Rehabilitative and Restorative Service Providers"

## 2021-05-01 ENCOUNTER — Ambulatory Visit (INDEPENDENT_AMBULATORY_CARE_PROVIDER_SITE_OTHER): Payer: BC Managed Care – PPO | Admitting: Rehabilitative and Restorative Service Providers"

## 2021-05-01 ENCOUNTER — Other Ambulatory Visit: Payer: Self-pay

## 2021-05-01 DIAGNOSIS — G8929 Other chronic pain: Secondary | ICD-10-CM | POA: Diagnosis not present

## 2021-05-01 DIAGNOSIS — R293 Abnormal posture: Secondary | ICD-10-CM | POA: Diagnosis not present

## 2021-05-01 DIAGNOSIS — M545 Low back pain, unspecified: Secondary | ICD-10-CM

## 2021-05-01 DIAGNOSIS — M6281 Muscle weakness (generalized): Secondary | ICD-10-CM | POA: Diagnosis not present

## 2021-05-01 NOTE — Patient Instructions (Signed)
Access Code: [email or web address] URL: [email or web address] Date: 05/01/2021 Prepared by: Scot Jun  Exercises Marching Bridge - 1 x daily - 7 x weekly - 3 sets - 10 reps Prone Hip Extension - 2 x daily - 7 x weekly - 1 sets - 10 reps Sidelying Hip Abduction - 2 x daily - 7 x weekly - 1 sets - 10 reps Supine Piriformis Stretch with Foot on Ground - 2 x daily - 7 x weekly - 1 sets - 3 reps - 30 sec hold Hooklying Single Knee to Chest - 2 x daily - 7 x weekly - 1 sets - 3 reps - 30 sec hold Child's Pose Stretch - 2 x daily - 7 x weekly - 1 sets - 10 reps Standing Lumbar Extension - 2-3 x daily - 7 x weekly - 1 sets - 5 reps Standing Shoulder Row with Anchored Resistance - 1 x daily - 7 x weekly - 3 sets - 10 reps Shoulder Extension with Resistance - 1 x daily - 7 x weekly - 3 sets - 10 reps Standing Anti-Rotation Press with Anchored Resistance - 1 x daily - 7 x weekly - 3 sets - 10 reps

## 2021-05-01 NOTE — Therapy (Signed)
[facility] Physical Therapy [address] [place], [place], [address] Phone: [phone]   Fax:  [phone]  Physical Therapy Treatment  Patient Details  Name: Heather Bailey MRN: [id number] Date of Birth: 22-Oct-1960 Referring Provider (PT): Aundra Dubin, [place]   Encounter Date: 05/01/2021   PT End of Session - 05/01/21 0805     Visit Number 4    Number of Visits 6    Date for PT Re-Evaluation 05/14/21    Authorization Type BCBS    PT Start Time 0804    PT Stop Time 0843    PT Time Calculation (min) 39 min    Activity Tolerance Patient tolerated treatment well    Behavior During Therapy [facility] for tasks assessed/performed             Past Medical History:  Diagnosis Date   Anemia Dx 2003   Arthritis Dx 1999   DVT (deep venous thrombosis) ([place])    Fibroids    Gestational diabetes    Hyperlipidemia Dx 2000   Hypertension Dx 2000   Hypertensive retinopathy    Thyroid disease Dx 2005    Past Surgical History:  Procedure Laterality Date   CESAREAN SECTION     MYOMECTOMY     TUBAL LIGATION      There were no vitals filed for this visit.   Subjective Assessment - 05/01/21 0807     Subjective Pt. indicated no pain today, "coming along pretty good."  Pt. indicated overall improvement around 70% to this point.   Pt. indicated chief complaint noted now is morning, some neck stiffness/pain at well.    Pertinent History HLD, DM, HTN, depression, DVT with PE    Limitations Sitting    Patient Stated Goals improve pain    Currently in Pain? No/denies    Pain Score 0-No pain    Pain Onset More than a month ago                [facility] PT Assessment - 05/01/21 0001       Assessment   Medical Diagnosis M54.50,G89.29 (ICD-10-CM) - Chronic bilateral low back pain, unspecified whether sciatica present    Referring Provider (PT) Aundra Dubin, PA-C      Observation/Other Assessments   Focus on Therapeutic Outcomes (FOTO)  updated 60%                            [facility] Adult PT Treatment/Exercise - 05/01/21 0001       Lumbar Exercises: Aerobic   Nustep Lvl 6 10 mins UE/LE      Lumbar Exercises: Standing   Row 20 reps;Both;Theraband    Theraband Level (Row) Level 4 (Blue)    Shoulder Extension 20 reps;Theraband;Both    Theraband Level (Shoulder Extension) Level 4 (Blue)    Other Standing Lumbar Exercises Palloff press blue tband 20x bilateral                     PT Education - 05/01/21 0847     Education Details HEP progress, POC discussion    Person(s) Educated Patient    Methods Explanation;Demonstration;Verbal cues;Handout    Comprehension Verbalized understanding;Returned demonstration              PT Short Term Goals - 05/01/21 0847       PT SHORT TERM GOAL #1   Title independent with initial HEP    Time 3  Period Weeks    Status Achieved    Target Date 04/23/21               PT Long Term Goals - 05/01/21 0846       PT LONG TERM GOAL #1   Title independent with final HEP    Time 6    Period Weeks    Status On-going    Target Date 05/14/21      PT LONG TERM GOAL #2   Title FOTO score improved to 58 for improved function    Time 6    Period Weeks    Status Achieved      PT LONG TERM GOAL #3   Title report pain < 3/10 with activity for improved function    Time 6    Period Weeks    Status On-going    Target Date 05/14/21      PT LONG TERM GOAL #4   Title demonstrate 4/5 bil hip strength for improved function    Time 6    Period Weeks    Status On-going    Target Date 05/14/21      PT LONG TERM GOAL #5   Title perform lumbar ROM without increase in pain for improved function    Time 6    Period Weeks    Status On-going    Target Date 05/14/21                   Plan - 05/01/21 8676     Clinical Impression Statement Pt. has continued to report positive management in symptoms c HEP usage.  Progressed HEP to include core activation  interventions as well as continued stretching for lumbar mobility.  FOTO reassessment was improved.    Personal Factors and Comorbidities Comorbidity 3+    Comorbidities HLD, DM, HTN, depression, DVT, PE    Examination-Activity Limitations Transfers;Bed Mobility;Sit;Sleep;Lift;Stand    Examination-Participation Restrictions Cleaning;Meal Prep;Community Activity;Occupation;Driving    Stability/Clinical Decision Making Evolving/Moderate complexity    Rehab Potential Good    PT Frequency 1x / week    PT Duration 6 weeks    PT Treatment/Interventions ADLs/Self Care Home Management;Cryotherapy;Electrical Stimulation;Traction;Moist Heat;Stair training;Gait training;Functional mobility training;Therapeutic activities;Therapeutic exercise;Neuromuscular re-education;Patient/family education;Manual techniques;Dry needling;Taping    PT Next Visit Plan Possible transitioning to HEP if Pt. desired.  Continue focus on building on mobility gains.    PT Home Exercise Plan Access Code: [email or web address]    [email or web address] and Agree with Plan of Care Patient             Patient will benefit from skilled therapeutic intervention in order to improve the following deficits and impairments:  Pain, Increased muscle spasms, Increased fascial restricitons, Decreased strength, Decreased range of motion, Impaired flexibility, Hypomobility, Postural dysfunction  Visit Diagnosis: Chronic bilateral low back pain, unspecified whether sciatica present  Muscle weakness (generalized)  Abnormal posture     Problem List Patient Active Problem List   Diagnosis Date Noted   Hyperlipidemia associated with type 2 diabetes mellitus ([place]) 11/03/2019   Class 1 obesity due to excess calories with serious comorbidity and body mass index (BMI) of 32.0 to 32.9 in adult 11/03/2019   Floaters in visual field, right 11/15/2018   Onychomycosis 08/15/2018   Achilles tendinitis of right lower extremity 08/15/2018   Hot flashes 02/23/2017    Chronic low back pain 04/12/2015   Dry skin 04/12/2015   Chronic dryness of both eyes 08/28/2014   DM2 (diabetes mellitus, type 2) ([place]) 03/19/2014  Hypothyroidism 03/19/2014   Hypertension 03/19/2014   Hyperlipidemia 03/19/2014   Depression 03/19/2014   Scot Jun, PT, DPT, OCS, ATC 05/01/21  8:48 AM    [facility] Physical Therapy [address] [place], [place], [address] Phone: [phone]   Fax:  [phone]  Name: Heather Bailey MRN: [id number] Date of Birth: May 01, 1961

## 2021-05-05 ENCOUNTER — Encounter: Payer: Self-pay | Admitting: Internal Medicine

## 2021-05-05 ENCOUNTER — Ambulatory Visit: Payer: BC Managed Care – PPO | Attending: Internal Medicine | Admitting: Internal Medicine

## 2021-05-05 ENCOUNTER — Other Ambulatory Visit: Payer: Self-pay

## 2021-05-05 VITALS — BP 139/91 | HR 67 | Resp 16 | Ht 66.0 in | Wt 199.8 lb

## 2021-05-05 DIAGNOSIS — M47896 Other spondylosis, lumbar region: Secondary | ICD-10-CM

## 2021-05-05 DIAGNOSIS — Z0000 Encounter for general adult medical examination without abnormal findings: Secondary | ICD-10-CM | POA: Diagnosis not present

## 2021-05-05 DIAGNOSIS — I10 Essential (primary) hypertension: Secondary | ICD-10-CM

## 2021-05-05 DIAGNOSIS — Z1231 Encounter for screening mammogram for malignant neoplasm of breast: Secondary | ICD-10-CM

## 2021-05-05 DIAGNOSIS — G44209 Tension-type headache, unspecified, not intractable: Secondary | ICD-10-CM | POA: Diagnosis not present

## 2021-05-05 DIAGNOSIS — E039 Hypothyroidism, unspecified: Secondary | ICD-10-CM

## 2021-05-05 DIAGNOSIS — Z6832 Body mass index (BMI) 32.0-32.9, adult: Secondary | ICD-10-CM

## 2021-05-05 DIAGNOSIS — H43391 Other vitreous opacities, right eye: Secondary | ICD-10-CM

## 2021-05-05 DIAGNOSIS — E6609 Other obesity due to excess calories: Secondary | ICD-10-CM

## 2021-05-05 DIAGNOSIS — Z1211 Encounter for screening for malignant neoplasm of colon: Secondary | ICD-10-CM

## 2021-05-05 DIAGNOSIS — L570 Actinic keratosis: Secondary | ICD-10-CM

## 2021-05-05 NOTE — Progress Notes (Signed)
Patient ID: Heather Bailey, female    DOB: 1961/03/12  MRN: [id number]  CC: Annual Exam   Subjective: Heather Bailey is a 60 y.o. female who presents for annual exam Her concerns today include:  Pt with hx of HTN, DM, hypothyroidism, HL, depression, chronic LBP (on Gabapentin), dry skin.   C/o HA this a.m which she attributes to stress..  Just got off 12 hr shift from work.  Has not eaten since yesterday at 6 p.m and very tired.   HA located at back of neck and in temple areas. Feels she will be good once she gets home and eat and able to lay down Endorses blurred vision, Photophobia.  No nausea, sinus congestion or lacrimation -similar HA in past but not as frequently as she use to.  Currently occurs once every several mths.  Usually last a few hrs She has not taken anything for it.  In past she took Excedrine Sinus -BP elev today Checks BP several times a wk.  Last reading  was 117/83 about 1 wk ago.  Average was 133/80-85.  Did not take BP meds as yet for the morning  DM:   Lab Results  Component Value Date   HGBA1C 6.3 (H) 02/06/2021   taking Metformin consistently Checks BS sometimes.  Reports readings are good Wgh up 6 lbs since August.  Wearing steel toe tennis shoes today. Eating one full meal a day.  Eating more fruits.  Trying to cut back on french fries. Saw an orthopedic specialist recently at [facility] because she felt her lower back was getting worse.  Had x-rays.  Told she has bone on bone.  Given short course of Prednisone and a muscle relaxant.  Doing P.T with them once a wk.  It is helping.  Thyroid: reports compliance with Levothyroxine first thing in a.m by itself.  Last TSH level 5.7  HM:  did mail in FIT test earlier this yr which we did not receive. Mom with colon CA at age 61.  She would like to get c-scope.  Has eye exam scheduled for next mth.  Declines Shingrix.  MMG scheduled 05/21/2021  Patient Active Problem List   Diagnosis Date Noted   Hyperlipidemia  associated with type 2 diabetes mellitus ([place]) 11/03/2019   Class 1 obesity due to excess calories with serious comorbidity and body mass index (BMI) of 32.0 to 32.9 in adult 11/03/2019   Floaters in visual field, right 11/15/2018   Onychomycosis 08/15/2018   Achilles tendinitis of right lower extremity 08/15/2018   Hot flashes 02/23/2017   Chronic low back pain 04/12/2015   Dry skin 04/12/2015   Chronic dryness of both eyes 08/28/2014   DM2 (diabetes mellitus, type 2) ([place]) 03/19/2014   Hypothyroidism 03/19/2014   Hypertension 03/19/2014   Hyperlipidemia 03/19/2014   Depression 03/19/2014     Current Outpatient Medications on File Prior to Visit  Medication Sig Dispense Refill   atorvastatin (LIPITOR) 40 MG tablet TAKE ONE-HALF (1/2) TABLET DAILY 45 tablet 2   cycloSPORINE (RESTASIS) 0.05 % ophthalmic emulsion Instill 1 drop in both eyes twice a day 60 each 3   diclofenac sodium (VOLTAREN) 1 % GEL Apply 2 g topically 4 (four) times daily. 100 g 1   fluticasone (FLONASE) 50 MCG/ACT nasal spray PLACE 2 SPRAYS INTO BOTH NOSTRILS DAILY. 16 g 6   gabapentin (NEURONTIN) 300 MG capsule Take 1 capsule (300 mg total) by mouth at bedtime. 90 capsule 3   glucose blood (  TRUE METRIX BLOOD GLUCOSE TEST) test strip Use as instructed to check blood sugar up to 3 times daily. 100 each 2   levothyroxine (SYNTHROID) 50 MCG tablet Take 1 tablet (50 mcg total) by mouth daily. 30 tablet 0   losartan (COZAAR) 25 MG tablet TAKE ONE-HALF (1/2) TABLET DAILY 45 tablet 1   metFORMIN (GLUCOPHAGE-XR) 500 MG 24 hr tablet TAKE 2 TABLETS DAILY 180 tablet 1   methocarbamol (ROBAXIN) 500 MG tablet Take 1 tablet (500 mg total) by mouth 2 (two) times daily as needed. 20 tablet 0   metoprolol succinate (TOPROL-XL) 25 MG 24 hr tablet TAKE 1 TABLET DAILY 90 tablet 1   predniSONE (DELTASONE) 5 MG tablet TAKE 6 TABLETS BY MOUTH ON DAY 1 THEN DECREASE BY 1 TABLET DAILY (6-5-4-3-2-1) 21 tablet 0   sertraline (ZOLOFT) 100 MG  tablet TAKE 1 TABLET DAILY 90 tablet 0   TRUEPLUS LANCETS 28G MISC Use to check blood sugar up to 3 times daily. 100 each 2   No current facility-administered medications on file prior to visit.    Allergies  Allergen Reactions   Zithromax [Azithromycin Dihydrate]     Social History   Socioeconomic History   Marital status: Married    Spouse name: Not on file   Number of children: 2   Years of education: Not on file   Highest education level: Bachelor's degree (e.g., BA, AB, BS)  Occupational History   Not on file  Tobacco Use   Smoking status: Never   Smokeless tobacco: Never  Vaping Use   Vaping Use: Never used  Substance and Sexual Activity   Alcohol use: No   Drug use: No   Sexual activity: Not Currently    Birth control/protection: Surgical  Other Topics Concern   Not on file  Social History Narrative   Lives with husband who has diabetes.    Social Determinants of Health   Financial Resource Strain: Not on file  Food Insecurity: Not on file  Transportation Needs: Not on file  Physical Activity: Not on file  Stress: Not on file  Social Connections: Not on file  Intimate Partner Violence: Not on file    Family History  Problem Relation Age of Onset   Hypertension Mother    Cancer Mother        colon/cervical   Alzheimer's disease Sister     Past Surgical History:  Procedure Laterality Date   CESAREAN SECTION     MYOMECTOMY     TUBAL LIGATION      ROS: Review of Systems  HENT:  Negative for hearing loss and sore throat.        Regular dental exam.  Had a recent abscess in the left lower jaw.  Eyes:        She wears glasses.  She notes floaters in the right eye which is chronic.  She states she has mentioned this before to her eye doctor.  She has an upcoming eye exam.  Respiratory:         Had a dry cough for a while but not in the past 1 week.  Cardiovascular:  Negative for chest pain.  Skin:        Reports having a mole close to the left  axilla that has been there for several years.  She feels it has increased in size recently.   PHYSICAL EXAM: BP (!) 156/96   Pulse 67   Resp 16   Ht 5\' 6"  (1.676 m)  Wt 199 lb 12.8 oz (90.6 kg)   SpO2 95%   BMI 32.25 kg/m   Wt Readings from Last 3 Encounters:  05/05/21 199 lb 12.8 oz (90.6 kg)  03/27/21 194 lb (88 kg)  02/06/21 194 lb 3.2 oz (88.1 kg)    Physical Exam  General appearance - alert, well appearing, and in no distress Mental status - normal mood, behavior, speech, dress, motor activity, and thought processes Eyes - pupils equal and reactive, extraocular eye movements intact Ears - bilateral TM's and external ear canals normal Nose - normal and patent, no erythema, discharge or polyps Mouth - mucous membranes moist, pharynx normal without lesions Neck - supple, no significant adenopathy Lymphatics - no palpable lymphadenopathy, no hepatosplenomegaly Chest - clear to auscultation, no wheezes, rales or rhonchi, symmetric air entry Heart - normal rate, regular rhythm, normal S1, S2, no murmurs, rubs, clicks or gallops Abdomen - soft, nontender, nondistended, no masses or organomegaly Musculoskeletal - no joint tenderness, deformity or swelling Extremities - peripheral pulses normal, no pedal edema, no clubbing or cyanosis Skin -1 cm pea-sized raised lesion to the left of the left nostril.  She has a similar 1 at the posterior neck.  She has had these for years and are unchanged.  She has a 1 cm slightly hyperpigmented keratosis lateral to the left breast. Neurologic: Cranial nerves grossly intact.  Power 5/5 in both upper and lower extremities.  Gross sensation intact.  Gait is stable.  CMP Latest Ref Rng & Units 11/06/2020 09/05/2020 05/03/2020  Glucose 65 - 99 mg/dL 89 86 105(H)  BUN 8 - 27 mg/dL 18 14 17   Creatinine 0.57 - 1.00 mg/dL 1.17(H) 1.06(H) 1.15(H)  Sodium 134 - 144 mmol/L 139 139 140  Potassium 3.5 - 5.2 mmol/L 4.4 4.5 4.5  Chloride 96 - 106 mmol/L 102  101 103  CO2 20 - 29 mmol/L 24 22 25   Calcium 8.7 - 10.3 mg/dL 10.1 10.0 10.3(H)  Total Protein 6.0 - 8.5 g/dL 7.7 - 8.0  Total Bilirubin 0.0 - 1.2 mg/dL <0.2 - 0.4  Alkaline Phos 44 - 121 IU/L 92 - 96  AST 0 - 40 IU/L 11 - 15  ALT 0 - 32 IU/L 12 - 17   Lipid Panel     Component Value Date/Time   CHOL 155 11/06/2020 0924   TRIG 119 11/06/2020 0924   HDL 40 11/06/2020 0924   CHOLHDL 3.9 11/06/2020 0924   CHOLHDL 4.3 12/19/2015 1620   VLDL 20 12/19/2015 1620   LDLCALC 93 11/06/2020 0924    CBC    Component Value Date/Time   WBC 4.9 05/03/2020 1624   WBC 6.6 03/10/2013 2005   RBC 4.59 05/03/2020 1624   RBC 3.99 03/10/2013 2005   HGB 13.0 05/03/2020 1624   HCT 40.2 05/03/2020 1624   PLT 321 05/03/2020 1624   MCV 88 05/03/2020 1624   MCH 28.3 05/03/2020 1624   MCH 29.3 03/10/2013 2005   MCHC 32.3 05/03/2020 1624   MCHC 33.2 03/10/2013 2005   RDW 13.1 05/03/2020 1624    ASSESSMENT AND PLAN: 1. Annual physical exam Discussed and encourage healthy eating habits and trying to move as much as she can.  2. Essential hypertension Not at goal.  However patient has not taken medications as yet for the morning.  She will take them as soon as she returns home.  3. Tension headache Most consistent with tension type headache plus or minus migraine.  However not lasting as long as  migraines usually would.  Recommend trial of Tylenol as needed.  Try to work on ways to decrease stress.  4. Acquired hypothyroidism Continue levothyroxine. - TSH  5. Keratosis I told her that this looks like a benign lesion.  However if it increases in size or changes color, we should refer her to a dermatologist.  6. Floaters in visual field, right Remind her to inform her eye doctor about the floaters when she is seen later this month.  7. Class 1 obesity due to excess calories with serious comorbidity and body mass index (BMI) of 32.0 to 32.9 in adult See #1 above.  8. Other osteoarthritis of  spine, lumbar region Followed by orthopedics.  Weight loss will help.  9. Screening for colon cancer Referred to gastroenterology for colonoscopy.  10. Encounter for screening mammogram for malignant neoplasm of breast Keep upcoming appointment for mammogram.   Patient was given the opportunity to ask questions.  Patient verbalized understanding of the plan and was able to repeat key elements of the plan.   No orders of the defined types were placed in this encounter.    Requested Prescriptions    No prescriptions requested or ordered in this encounter    No follow-ups on file.  Karle Plumber, MD, FACP

## 2021-05-05 NOTE — Patient Instructions (Signed)

## 2021-05-06 LAB — TSH: TSH: 3.33 u[IU]/mL (ref 0.450–4.500)

## 2021-05-07 ENCOUNTER — Encounter: Payer: Self-pay | Admitting: Physical Therapy

## 2021-05-07 ENCOUNTER — Ambulatory Visit (INDEPENDENT_AMBULATORY_CARE_PROVIDER_SITE_OTHER): Payer: BC Managed Care – PPO | Admitting: Physical Therapy

## 2021-05-07 ENCOUNTER — Other Ambulatory Visit: Payer: Self-pay

## 2021-05-07 DIAGNOSIS — M545 Low back pain, unspecified: Secondary | ICD-10-CM

## 2021-05-07 DIAGNOSIS — M6281 Muscle weakness (generalized): Secondary | ICD-10-CM | POA: Diagnosis not present

## 2021-05-07 DIAGNOSIS — G8929 Other chronic pain: Secondary | ICD-10-CM

## 2021-05-07 DIAGNOSIS — R293 Abnormal posture: Secondary | ICD-10-CM | POA: Diagnosis not present

## 2021-05-07 NOTE — Therapy (Signed)
[facility] Physical Therapy [address] [place], [place], [address] Phone: [phone]   Fax:  [phone]  Physical Therapy Treatment/Discharge Summary  Patient Details  Name: Heather Bailey MRN: [id number] Date of Birth: 13-Jul-1960 Referring Provider (PT): Aundra Dubin, [place]   Encounter Date: 05/07/2021   PT End of Session - 05/07/21 0816     Visit Number 5    Date for PT Re-Evaluation 05/14/21    Authorization Type BCBS    PT Start Time 0803    PT Stop Time 0832    PT Time Calculation (min) 29 min    Activity Tolerance Patient tolerated treatment well    Behavior During Therapy [facility] for tasks assessed/performed             Past Medical History:  Diagnosis Date   Anemia Dx 2003   Arthritis Dx 1999   DVT (deep venous thrombosis) ([place])    Fibroids    Gestational diabetes    Hyperlipidemia Dx 2000   Hypertension Dx 2000   Hypertensive retinopathy    Thyroid disease Dx 2005    Past Surgical History:  Procedure Laterality Date   CESAREAN SECTION     MYOMECTOMY     TUBAL LIGATION      There were no vitals filed for this visit.   Subjective Assessment - 05/07/21 0805     Subjective doing well.  has been "feeling really good."    Pertinent History HLD, DM, HTN, depression, DVT with PE    Limitations Sitting    Patient Stated Goals improve pain    Currently in Pain? No/denies    Pain Onset More than a month ago                [facility] PT Assessment - 05/07/21 0818       Assessment   Medical Diagnosis M54.50,G89.29 (ICD-10-CM) - Chronic bilateral low back pain, unspecified whether sciatica present    Referring Provider (PT) Aundra Dubin, PA-C      AROM   Overall AROM Comments lumbar ROM WNL without pain      Strength   Right Hip Flexion 5/5    Right Hip Extension 4/5    Right Hip ABduction 5/5    Left Hip Flexion 5/5    Left Hip Extension 4/5    Left Hip ABduction 5/5    Right Knee Flexion 5/5    Right Knee  Extension 5/5    Left Knee Flexion 5/5    Left Knee Extension 5/5                           [facility] Adult PT Treatment/Exercise - 05/07/21 0807       Exercises   Other Exercises  discussed HEP and continued exercise as well as utilizing gym for continued exercise.      Lumbar Exercises: Aerobic   Nustep Lvl 6 10 mins UE/LE                       PT Short Term Goals - 05/01/21 0847       PT SHORT TERM GOAL #1   Title independent with initial HEP    Time 3    Period Weeks    Status Achieved    Target Date 04/23/21               PT Long Term Goals - 05/07/21 6160  PT LONG TERM GOAL #1   Title independent with final HEP    Time 6    Period Weeks    Status Achieved      PT LONG TERM GOAL #2   Title FOTO score improved to 58 for improved function    Time 6    Period Weeks    Status Achieved      PT LONG TERM GOAL #3   Title report pain < 3/10 with activity for improved function    Time 6    Period Weeks    Status Achieved      PT LONG TERM GOAL #4   Title demonstrate 4/5 bil hip strength for improved function    Time 6    Period Weeks    Status Achieved      PT LONG TERM GOAL #5   Title perform lumbar ROM without increase in pain for improved function    Time 6    Period Weeks    Status Achieved                   Plan - 05/07/21 3546     Clinical Impression Statement Pt has met all goals and is ready for d/c from PT.  She understands need for continued exercise and has well established HEP as well as gym membership to continue exercise.  Will d/c PT today.    Personal Factors and Comorbidities Comorbidity 3+    Comorbidities HLD, DM, HTN, depression, DVT, PE    Examination-Activity Limitations Transfers;Bed Mobility;Sit;Sleep;Lift;Stand    Examination-Participation Restrictions Cleaning;Meal Prep;Community Activity;Occupation;Driving    Stability/Clinical Decision Making Evolving/Moderate complexity     Rehab Potential Good    PT Frequency 1x / week    PT Duration 6 weeks    PT Treatment/Interventions ADLs/Self Care Home Management;Cryotherapy;Electrical Stimulation;Traction;Moist Heat;Stair training;Gait training;Functional mobility training;Therapeutic activities;Therapeutic exercise;Neuromuscular re-education;Patient/family education;Manual techniques;Dry needling;Taping    PT Next Visit Plan d/c PT today    PT Home Exercise Plan Access Code: [email or web address]    Consulted and Agree with Plan of Care Patient             Patient will benefit from skilled therapeutic intervention in order to improve the following deficits and impairments:  Pain, Increased muscle spasms, Increased fascial restricitons, Decreased strength, Decreased range of motion, Impaired flexibility, Hypomobility, Postural dysfunction  Visit Diagnosis: Chronic bilateral low back pain, unspecified whether sciatica present  Muscle weakness (generalized)  Abnormal posture     Problem List Patient Active Problem List   Diagnosis Date Noted   Hyperlipidemia associated with type 2 diabetes mellitus ([place]) 11/03/2019   Class 1 obesity due to excess calories with serious comorbidity and body mass index (BMI) of 32.0 to 32.9 in adult 11/03/2019   Floaters in visual field, right 11/15/2018   Onychomycosis 08/15/2018   Achilles tendinitis of right lower extremity 08/15/2018   Hot flashes 02/23/2017   Chronic low back pain 04/12/2015   Dry skin 04/12/2015   Chronic dryness of both eyes 08/28/2014   DM2 (diabetes mellitus, type 2) ([place]) 03/19/2014   Hypothyroidism 03/19/2014   Hypertension 03/19/2014   Hyperlipidemia 03/19/2014   Depression 03/19/2014      Laureen Abrahams, PT, DPT 05/07/21 8:34 AM     [facility] [address] [place], [place], [address] Phone: [phone]   Fax:  [phone]  Name: Heather Bailey MRN: [id number] Date of Birth:  03-Nov-1960     PHYSICAL THERAPY DISCHARGE SUMMARY  Visits  from Start of Care: 5  Current functional level related to goals / functional outcomes: See above   Remaining deficits: See above   Education / Equipment: HEP   Patient agrees to discharge. Patient goals were met. Patient is being discharged due to meeting the stated rehab goals.  Laureen Abrahams, PT, DPT 05/07/21 8:34 AM  [facility] Physical Therapy [address] [place], [place], [address] Phone: [phone]   Fax:  [phone]

## 2021-05-17 ENCOUNTER — Other Ambulatory Visit: Payer: Self-pay | Admitting: Internal Medicine

## 2021-05-17 DIAGNOSIS — E039 Hypothyroidism, unspecified: Secondary | ICD-10-CM

## 2021-05-18 NOTE — Telephone Encounter (Signed)
Requested Prescriptions  Pending Prescriptions Disp Refills  . levothyroxine (SYNTHROID) 50 MCG tablet [Pharmacy Med Name: L-THYROXINE (SYNTHROID) TABS 50MCG] 90 tablet 2    Sig: TAKE 1 TABLET DAILY (MUST KEEP UPCOMING OFFICE VISIT FOR REFILLS)     Endocrinology:  Hypothyroid Agents Failed - 05/17/2021 10:08 AM      Failed - TSH needs to be rechecked within 3 months after an abnormal result. Refill until TSH is due.      Passed - TSH in normal range and within 360 days    TSH  Date Value Ref Range Status  05/05/2021 3.330 0.450 - 4.500 uIU/mL Final         Passed - Valid encounter within last 12 months    Recent Outpatient Visits          1 week ago Annual physical exam   [place] Kar[place] Plumber B, MD   3 months ago Type 2 diabetes mellitus with obesity [facility])   [place] Karle Plumber B, MD   6 months ago Type 2 diabetes mellitus with obesity [facility])   [place], [place], [place]   8 months ago Type 2 diabetes mellitus with obesity [facility])   [place], [place], [place]   1 year ago Annual physical exam   [place], Deborah B, MD      Future Appointments            In 3 months Wynetta Emery Dalbert Batman, MD [place]

## 2021-05-20 ENCOUNTER — Other Ambulatory Visit: Payer: Self-pay

## 2021-05-20 ENCOUNTER — Other Ambulatory Visit: Payer: Self-pay | Admitting: Internal Medicine

## 2021-05-20 DIAGNOSIS — E039 Hypothyroidism, unspecified: Secondary | ICD-10-CM

## 2021-05-20 MED ORDER — LEVOTHYROXINE SODIUM 50 MCG PO TABS
ORAL_TABLET | ORAL | 0 refills | Status: DC
  Filled 2021-05-20 – 2021-05-21 (×2): qty 30, 30d supply, fill #0

## 2021-05-20 NOTE — Telephone Encounter (Signed)
Medication Refill - Medication: Synthroid   Has the patient contacted their pharmacy? Yes.    (Agent: If yes, when and what did the pharmacy advise?) Patient states that she contacted pharmacy and they are telling her that this prescription is discontinued, she is requesting a short supply be sent [facility] until a [facility]  Preferred Pharmacy (with phone number or street name):  [facility] [place]  Phone:  [phone] Fax:  [place], [place]  Phone:  [phone] Fax:  [phone]  Has the patient been seen for an appointment in the last year OR does the patient have an upcoming appointment? Yes.

## 2021-05-20 NOTE — Telephone Encounter (Signed)
Call to [facility]- verified they do have Rx- it is being processed and will be shipped by 12/10. Also verified they have information needed from patient. Short term Rx sent to local pharmacy and message left for patient on her VM.

## 2021-05-21 ENCOUNTER — Other Ambulatory Visit: Payer: Self-pay

## 2021-05-29 DIAGNOSIS — Z1231 Encounter for screening mammogram for malignant neoplasm of breast: Secondary | ICD-10-CM | POA: Diagnosis not present

## 2021-06-30 ENCOUNTER — Other Ambulatory Visit: Payer: Self-pay | Admitting: Internal Medicine

## 2021-06-30 DIAGNOSIS — F419 Anxiety disorder, unspecified: Secondary | ICD-10-CM

## 2021-06-30 NOTE — Telephone Encounter (Signed)
Requested Prescriptions  Pending Prescriptions Disp Refills   sertraline (ZOLOFT) 100 MG tablet [Pharmacy Med Name: SERTRALINE HCL TABS 100MG ] 90 tablet 1    Sig: TAKE 1 TABLET DAILY     Psychiatry:  Antidepressants - SSRI Passed - 06/30/2021  1:50 AM      Passed - Completed PHQ-2 or PHQ-9 in the last 360 days      Passed - Valid encounter within last 6 months    Recent Outpatient Visits          1 month ago Annual physical exam   [place] Karle Plumber B, MD   4 months ago Type 2 diabetes mellitus with obesity [facility])   [place] Karle Plumber B, MD   7 months ago Type 2 diabetes mellitus with obesity [facility])   [place], [place], [place]   9 months ago Type 2 diabetes mellitus with obesity [facility])   [place], [place], [place]   1 year ago Annual physical exam   [place], Deborah B, MD      Future Appointments            In 2 months Wynetta Emery Dalbert Batman, MD [place]

## 2021-07-24 ENCOUNTER — Encounter: Payer: Self-pay | Admitting: Gastroenterology

## 2021-08-21 ENCOUNTER — Ambulatory Visit (AMBULATORY_SURGERY_CENTER): Payer: BC Managed Care – PPO | Admitting: *Deleted

## 2021-08-21 ENCOUNTER — Other Ambulatory Visit: Payer: Self-pay

## 2021-08-21 VITALS — Ht 66.0 in | Wt 194.0 lb

## 2021-08-21 DIAGNOSIS — Z1211 Encounter for screening for malignant neoplasm of colon: Secondary | ICD-10-CM

## 2021-08-21 NOTE — Progress Notes (Signed)

## 2021-08-27 ENCOUNTER — Encounter: Payer: Self-pay | Admitting: Gastroenterology

## 2021-09-03 ENCOUNTER — Emergency Department (HOSPITAL_COMMUNITY): Payer: BC Managed Care – PPO

## 2021-09-03 ENCOUNTER — Emergency Department (HOSPITAL_COMMUNITY)
Admission: EM | Admit: 2021-09-03 | Discharge: 2021-09-03 | Disposition: A | Discharge status: Home / Self Care | Payer: BC Managed Care – PPO | Attending: Emergency Medicine | Admitting: Emergency Medicine

## 2021-09-03 ENCOUNTER — Encounter (HOSPITAL_COMMUNITY): Payer: Self-pay | Admitting: Emergency Medicine

## 2021-09-03 ENCOUNTER — Other Ambulatory Visit: Payer: Self-pay

## 2021-09-03 DIAGNOSIS — M25552 Pain in left hip: Secondary | ICD-10-CM | POA: Insufficient documentation

## 2021-09-03 DIAGNOSIS — R55 Syncope and collapse: Secondary | ICD-10-CM | POA: Insufficient documentation

## 2021-09-03 DIAGNOSIS — S2242XA Multiple fractures of ribs, left side, initial encounter for closed fracture: Secondary | ICD-10-CM | POA: Diagnosis not present

## 2021-09-03 DIAGNOSIS — I878 Other specified disorders of veins: Secondary | ICD-10-CM | POA: Diagnosis not present

## 2021-09-03 DIAGNOSIS — I10 Essential (primary) hypertension: Secondary | ICD-10-CM | POA: Insufficient documentation

## 2021-09-03 DIAGNOSIS — R42 Dizziness and giddiness: Secondary | ICD-10-CM | POA: Diagnosis not present

## 2021-09-03 DIAGNOSIS — M1612 Unilateral primary osteoarthritis, left hip: Secondary | ICD-10-CM | POA: Diagnosis not present

## 2021-09-03 DIAGNOSIS — W19XXXA Unspecified fall, initial encounter: Secondary | ICD-10-CM | POA: Insufficient documentation

## 2021-09-03 DIAGNOSIS — S299XXA Unspecified injury of thorax, initial encounter: Secondary | ICD-10-CM | POA: Diagnosis not present

## 2021-09-03 DIAGNOSIS — Z79899 Other long term (current) drug therapy: Secondary | ICD-10-CM | POA: Insufficient documentation

## 2021-09-03 DIAGNOSIS — M545 Low back pain, unspecified: Secondary | ICD-10-CM | POA: Diagnosis not present

## 2021-09-03 LAB — COMPREHENSIVE METABOLIC PANEL
ALT: 21 U/L (ref 0–44)
AST: 22 U/L (ref 15–41)
Albumin: 4.5 g/dL (ref 3.5–5.0)
Alkaline Phosphatase: 72 U/L (ref 38–126)
Anion gap: 7 (ref 5–15)
BUN: 15 mg/dL (ref 6–20)
CO2: 27 mmol/L (ref 22–32)
Calcium: 9.6 mg/dL (ref 8.9–10.3)
Chloride: 104 mmol/L (ref 98–111)
Creatinine, Ser: 1.17 mg/dL — ABNORMAL HIGH (ref 0.44–1.00)
GFR, Estimated: 53 mL/min — ABNORMAL LOW (ref >60)
Glucose, Bld: 165 mg/dL — ABNORMAL HIGH (ref 70–99)
Potassium: 3.7 mmol/L (ref 3.5–5.1)
Sodium: 138 mmol/L (ref 135–145)
Total Bilirubin: 0.6 mg/dL (ref 0.3–1.2)
Total Protein: 8.2 g/dL — ABNORMAL HIGH (ref 6.5–8.1)

## 2021-09-03 LAB — CBC
HCT: 39.2 % (ref 36.0–46.0)
Hemoglobin: 12.5 g/dL (ref 12.0–15.0)
MCH: 28.7 pg (ref 26.0–34.0)
MCHC: 31.9 g/dL (ref 30.0–36.0)
MCV: 90.1 fL (ref 80.0–100.0)
Platelets: 268 10*3/uL (ref 150–400)
RBC: 4.35 MIL/uL (ref 3.87–5.11)
RDW: 13.2 % (ref 11.5–15.5)
WBC: 5.9 10*3/uL (ref 4.0–10.5)
nRBC: 0 % (ref 0.0–0.2)

## 2021-09-03 LAB — MAGNESIUM: Magnesium: 2 mg/dL (ref 1.7–2.4)

## 2021-09-03 LAB — CBG MONITORING, ED: Glucose-Capillary: 94 mg/dL (ref 70–99)

## 2021-09-03 LAB — TROPONIN I (HIGH SENSITIVITY): Troponin I (High Sensitivity): <2 ng/L (ref <18)

## 2021-09-03 MED ORDER — LIDOCAINE 4 % EX PTCH: 1.0000 | MEDICATED_PATCH | Freq: Two times a day (BID) | CUTANEOUS | 0 refills | Status: DC | PRN

## 2021-09-03 MED ORDER — SODIUM CHLORIDE 0.9 % IV BOLUS
1000.0000 mL | Freq: Once | INTRAVENOUS | Status: AC
  Administered 2021-09-03: 1000 mL via INTRAVENOUS

## 2021-09-03 MED ORDER — OXYCODONE-ACETAMINOPHEN 5-325 MG PO TABS: 1.0000 | ORAL_TABLET | Freq: Three times a day (TID) | ORAL | 0 refills | Status: AC | PRN

## 2021-09-03 MED ORDER — OXYCODONE-ACETAMINOPHEN 5-325 MG PO TABS
1.0000 | ORAL_TABLET | Freq: Once | ORAL | Status: AC
  Administered 2021-09-03: 1 via ORAL
  Filled 2021-09-03: qty 1

## 2021-09-03 MED ORDER — ACETAMINOPHEN 500 MG PO TABS
1000.0000 mg | ORAL_TABLET | Freq: Once | ORAL | Status: AC
  Administered 2021-09-03: 1000 mg via ORAL
  Filled 2021-09-03: qty 2

## 2021-09-03 NOTE — Discharge Instructions (Addendum)
You came to the emergency department today to be evaluated for your left side pain after a syncopal episode.  Your lab results and EKG are reassuring regarding your syncopal episode.  You will need to follow-up closely with your primary care provider for repeat evaluation. ? ?The x-ray of your ribs showed that your 10th and 11th ribs are broken and minimally displaced.  Due to this have given you prescription for pain medication.  Please take this medication as prescribed.  Additionally I have given you an incentive spirometer.  Please make sure to use this at least once an hour while awake to help avoid developing pneumonia. ? ?Today you received medications that may make you sleepy or impair your ability to make decisions.  For the next 24 hours please do not drive, operate heavy machinery, care for a small child with out another adult present, or perform any activities that may cause harm to you or someone else if you were to fall asleep or be impaired.  ? ?You are being prescribed a medication which may make you sleepy. Please follow up of listed precautions for at least 24 hours after taking one dose. ? ? ?Get help right away if: ?You faint. ?You hit your head or are injured after fainting. ?You have any of these symptoms that may indicate trouble with your heart: ?Fast or irregular heartbeats (palpitations). ?Unusual pain in your chest, abdomen, or back. ?Shortness of breath. ?You have a seizure. ?You have a severe headache. ?You are confused. ?You have vision problems. ?You have severe weakness or trouble walking. ?You are bleeding from your mouth or rectum, or you have black or tarry stool. ?

## 2021-09-03 NOTE — ED Provider Notes (Signed)
?[facility] DEPT ?Provider Note ? ? ?CSN: [id number] ?Arrival date & time: 09/03/21  1250 ? ?  ? ?History ? ?Chief Complaint  ?Patient presents with  ? Syncopal Episode   ? Left side pain   ? ? ?Heather Bailey is a 61 y.o. female with past medical history of hypertension, hyperlipidemia, DVT, gestational diabetes.  Presents to the emergency department with acute complaint of syncopal episode.  Patient reports that single episode occurred while she was at work.  This morning at 2 AM she was sitting at her desk when she started to feel nauseous and became hot.  Patient reports that she was walking to the bathroom when she had a syncopal episode.  Patient endorses falling to the floor.  Patient is on longer sure how long she was unconscious for.  Has full recollection of the events after regaining consciousness.  Patient denies any preceding chest pain, palpitations, shortness of breath, sudden onset of headache.  Patient reports that she has had pain to left hip and left chest wall after the injury.  Pain is worse with touch and movement.  Patient took medication immediately after fall but has not taken anything since. ? ?Patient denies any change in medications.  No alcohol or illicit drug use.  Patient is not on any blood thinners ? ?Patient denies any fever, chills, chest pain, shortness of breath, palpitations, leg swelling or tenderness, abdominal pain, nausea, vomiting, diarrhea, blood in stool, melena, dysuria, hematuria, urinary urgency, vaginal pain, vaginal bleeding, vaginal discharge. ? ?HPI ? ?  ? ?Home Medications ?Prior to Admission medications   ?Medication Sig Start Date End Date Taking? Authorizing Provider  ?atorvastatin (LIPITOR) 40 MG tablet TAKE ONE-HALF (1/2) TABLET DAILY 02/18/21   Ladell Pier, MD  ?cycloSPORINE (RESTASIS) 0.05 % ophthalmic emulsion Instill 1 drop in both eyes twice a day 10/31/20     ?diclofenac sodium (VOLTAREN) 1 % GEL Apply 2 g topically 4  (four) times daily. 03/03/19   Fulp, Cammie, MD  ?fluticasone (FLONASE) 50 MCG/ACT nasal spray PLACE 2 SPRAYS INTO BOTH NOSTRILS DAILY. ?Patient not taking: Reported on 08/21/2021 11/06/20 11/06/21  Argentina Donovan, PA-C  ?gabapentin (NEURONTIN) 300 MG capsule Take 1 capsule (300 mg total) by mouth at bedtime. ?Patient not taking: Reported on 08/21/2021 11/06/20   Argentina Donovan, PA-C  ?glucose blood (TRUE METRIX BLOOD GLUCOSE TEST) test strip Use as instructed to check blood sugar up to 3 times daily. 08/17/18   Ladell Pier, MD  ?levothyroxine (SYNTHROID) 50 MCG tablet TAKE 1 TABLET BY MOUTH DAILY 05/20/21   Ladell Pier, MD  ?losartan (COZAAR) 25 MG tablet TAKE ONE-HALF (1/2) TABLET DAILY 03/31/21   Ladell Pier, MD  ?metFORMIN (GLUCOPHAGE-XR) 500 MG 24 hr tablet TAKE 2 TABLETS DAILY 03/31/21   Ladell Pier, MD  ?methocarbamol (ROBAXIN) 500 MG tablet Take 1 tablet (500 mg total) by mouth 2 (two) times daily as needed. 03/27/21   Aundra Dubin, PA-C  ?metoprolol succinate (TOPROL-XL) 25 MG 24 hr tablet TAKE 1 TABLET DAILY 03/31/21   Ladell Pier, MD  ?Multiple Vitamin (MULTIVITAMIN ADULT PO) Take by mouth.    [provider]  ?predniSONE (DELTASONE) 5 MG tablet TAKE 6 TABLETS BY MOUTH ON DAY 1 THEN DECREASE BY 1 TABLET DAILY (6-5-4-3-2-1) 03/27/21   Aundra Dubin, PA-C  ?sertraline (ZOLOFT) 100 MG tablet TAKE 1 TABLET DAILY 06/30/21   Ladell Pier, MD  ?TRUEPLUS LANCETS 28G MISC Use  to check blood sugar up to 3 times daily. 08/17/18   Ladell Pier, MD  ?   ? ?Allergies    ?Zithromax [azithromycin dihydrate]   ? ?Review of Systems   ?Review of Systems  ?Constitutional:  Negative for chills and fever.  ?Eyes:  Negative for visual disturbance.  ?Respiratory:  Negative for shortness of breath.   ?Cardiovascular:  Negative for chest pain, palpitations and leg swelling.  ?Gastrointestinal:  Negative for abdominal pain, anal bleeding, blood in stool, diarrhea, nausea and  vomiting.  ?Genitourinary:  Positive for frequency. Negative for difficulty urinating, dysuria, hematuria, vaginal bleeding, vaginal discharge and vaginal pain.  ?Musculoskeletal:  Negative for back pain and neck pain.  ?Skin:  Negative for color change, pallor, rash and wound.  ?Neurological:  Positive for syncope and light-headedness. Negative for dizziness, tremors, seizures, facial asymmetry, speech difficulty, numbness and headaches.  ?Psychiatric/Behavioral:  Negative for confusion.   ? ?Physical Exam ?Updated Vital Signs ?BP (!) 137/92 (BP Location: Left Arm)   Pulse 89   Temp 98.3 ?F (36.8 ?C) (Oral)   Resp 16   SpO2 100%  ?Physical Exam ?Vitals and nursing note reviewed.  ?Constitutional:   ?   General: She is not in acute distress. ?   Appearance: She is not ill-appearing, toxic-appearing or diaphoretic.  ?HENT:  ?   Head: Normocephalic and atraumatic. No raccoon eyes, Battle's sign, abrasion, contusion, right periorbital erythema, left periorbital erythema or laceration.  ?Eyes:  ?   General: No scleral icterus.    ?   Right eye: No discharge.     ?   Left eye: No discharge.  ?Cardiovascular:  ?   Rate and Rhythm: Normal rate.  ?   Heart sounds: Normal heart sounds.  ?Pulmonary:  ?   Effort: Pulmonary effort is normal. No tachypnea, bradypnea or respiratory distress.  ?   Breath sounds: Normal breath sounds. No stridor.  ?Chest:  ?   Chest wall: Tenderness present. No mass, lacerations, deformity, swelling, crepitus or edema.  ?   Comments: Tenderness to the left chest wall. ?Abdominal:  ?   Palpations: Abdomen is soft.  ?   Tenderness: There is no abdominal tenderness.  ?Musculoskeletal:  ?   Cervical back: Neck supple. No swelling, edema, deformity, erythema, signs of trauma, lacerations, rigidity, spasms, torticollis, tenderness, bony tenderness or crepitus. No pain with movement. Normal range of motion.  ?   Thoracic back: No swelling, edema, deformity, signs of trauma, lacerations, spasms,  tenderness or bony tenderness.  ?   Lumbar back: No swelling, edema, deformity, signs of trauma, lacerations, spasms, tenderness or bony tenderness.  ?   Right hip: Normal.  ?   Left hip: Tenderness and bony tenderness present. No deformity, lacerations or crepitus.  ?   Right upper leg: No deformity, tenderness or bony tenderness.  ?   Left upper leg: No deformity, tenderness or bony tenderness.  ?   Right knee: No swelling, deformity, bony tenderness or crepitus. Normal range of motion. No tenderness.  ?   Left knee: No swelling, deformity, bony tenderness or crepitus. Normal range of motion. No tenderness.  ?   Right lower leg: Normal.  ?   Left lower leg: Normal.  ?   Right ankle: No swelling, deformity, ecchymosis or lacerations. No tenderness. Normal range of motion.  ?   Left ankle: No swelling, deformity, ecchymosis or lacerations. No tenderness. Normal range of motion.  ?   Comments: No midline tenderness or deformity  to cervical, thoracic, lumbar spine.  Patient has tenderness to left lumbar back and left hip.  No leg length discrepancy or rotation.  Patient able to move all limbs equally without difficulty.  Does complain of pain to left hip with movement of left lower leg.  ?Skin: ?   General: Skin is warm and dry.  ?Neurological:  ?   General: No focal deficit present.  ?   Mental Status: She is alert and oriented to person, place, and time.  ?   GCS: GCS eye subscore is 4. GCS verbal subscore is 5. GCS motor subscore is 6.  ?   Cranial Nerves: No dysarthria.  ?Psychiatric:     ?   Behavior: Behavior is cooperative.  ? ? ?ED Results / Procedures / Treatments   ?Labs ?(all labs ordered are listed, but only abnormal results are displayed) ?Labs Reviewed  ?COMPREHENSIVE METABOLIC PANEL - Abnormal; Notable for the following components:  ?    Result Value  ? Glucose, Bld 165 (*)   ? Creatinine, Ser 1.17 (*)   ? Total Protein 8.2 (*)   ? GFR, Estimated 53 (*)   ? All other components within normal limits   ?CBC  ?MAGNESIUM  ?CBG MONITORING, ED  ?CBG MONITORING, ED  ?TROPONIN I (HIGH SENSITIVITY)  ? ? ?EKG ?None ? ?Radiology ?DG Ribs Unilateral W/Chest Left ? ?Result Date: 09/03/2021 ?CLINICAL DATA:  pain after fall EXAM:

## 2021-09-03 NOTE — ED Triage Notes (Signed)
Pt reports syncopal episode around 2am. Left side pain. Pt reporting thinking she hit her left side when she fell last night.  ?

## 2021-09-04 ENCOUNTER — Ambulatory Visit: Payer: BC Managed Care – PPO | Attending: Internal Medicine | Admitting: Internal Medicine

## 2021-09-04 ENCOUNTER — Telehealth: Payer: Self-pay | Admitting: *Deleted

## 2021-09-04 ENCOUNTER — Telehealth: Payer: Self-pay | Admitting: Internal Medicine

## 2021-09-04 DIAGNOSIS — I10 Essential (primary) hypertension: Secondary | ICD-10-CM | POA: Diagnosis not present

## 2021-09-04 DIAGNOSIS — R55 Syncope and collapse: Secondary | ICD-10-CM | POA: Diagnosis not present

## 2021-09-04 DIAGNOSIS — S2232XA Fracture of one rib, left side, initial encounter for closed fracture: Secondary | ICD-10-CM | POA: Diagnosis not present

## 2021-09-04 MED ORDER — OXYCODONE-ACETAMINOPHEN 5-325 MG PO TABS: 1.0000 | ORAL_TABLET | Freq: Three times a day (TID) | ORAL | 0 refills | Status: DC | PRN

## 2021-09-04 NOTE — Progress Notes (Signed)
Virtual Visit via Telephone Note ? ?I connected with Heather Bailey on 09/04/2021 at 11:54 AM by telephone and verified that I am speaking with the correct person using two identifiers ? ?Location: ?Patient: home ?Provider: office ? ?Participants: ?Myself ?Patient ?  ?I discussed the limitations, risks, security and privacy concerns of performing an evaluation and management service by telephone and the availability of in person appointments. I also discussed with the patient that there may be a patient responsible charge related to this service. The patient expressed understanding and agreed to proceed. ? ? ?History of Present Illness: ?Pt with hx of HTN, DM, hypothyroidism, HL, depression, chronic LBP (on Gabapentin), dry skin.   Last seen 04/2021.  This is for ER follow-up ? ?Pt seen in ER yesterday post syncopy at work where she sustained fracture of 10th and 11th  rib on the left side. 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 ?-She had misplaced her device that she normally use to look at automotive parts.  She ended up having to climb up and down a ladder looking in 11 cabinets each of which had 15-17 draws to try to find her device.  This involved a lot of bending and climbing.  The whole time she was inhaling fumes from off the tool that were in the  drawers.  By the time she came down off the ladder and sat at her desk.  She felt nauseated and hot.  She jumped up to go to the bathroom.  While trying to make it to the bathroom she felt palpitations.  She made it to the break room where she passed out.  She thinks she hit her left side on a stool. ?-She was found by a coworker on the floor.  She thinks she may have been out for a few minutes.  There was no loss of bowel or bladder function.  Coworkers did not report any shaking episodes.  Patient feels that the fainting episode was due to her climbing up and down that ladder and from breathing in fumes from the tools ?-Denies any  chest pains or shortness of breath prior to the episode.  She works evenings and had eaten part of her dinner prior to going into work.  She drinks several glasses of water throughout her shift so does not feel that she was dehydrated. ?-Reports having had a syncopal episode in 1997 when she was pregnant with her last son.  Thought to be due to the fact that she got up too fast at that time. ? ?In the emergency room yesterday, EKG revealed sinus rhythm.  Troponin level was normal.  CBC normal.  Creatinine 1.17 with GFR of 53 unchanged from May of last year.  Rib x-rays revealed minimally displaced left lateral 10th and posterior 11th rib fractures.  No lumbar fracture.  Multilevel degenerative disc disease and degenerative grade 1 anterior listhesis at L4-L5 seen.  X-ray of the left hip was negative for fracture but did show mild left osteoarthritis.  Patient discharged with some Percocet to take every 8 hours as needed and an incentive spirometry.  They forgot to give her the incentive spirometry but she was called back today and her son plans to pick that up. ?She has been taking the Percocet every 8 hours as prescribed.  She was only given a prescription for 9 tablets.  She rates pain as 10/10 when the Percocet wears off.  Pain is worse with movement and breathing ? ?  She has not been checking her blood pressure as often as in the past.  She now checks it about every other week.  Last level was 130/94.  Reports compliance with lovastatin and metoprolol. ?She also reports compliance with taking her levothyroxine every day.  Last TSH done 04/2021 was 3.3 in the normal range. ? ? ?Outpatient Encounter Medications as of 09/04/2021  ?Medication Sig  ? atorvastatin (LIPITOR) 40 MG tablet TAKE ONE-HALF (1/2) TABLET DAILY  ? cycloSPORINE (RESTASIS) 0.05 % ophthalmic emulsion Instill 1 drop in both eyes twice a day  ? diclofenac sodium (VOLTAREN) 1 % GEL Apply 2 g topically 4 (four) times daily.  ? fluticasone (FLONASE) 50  MCG/ACT nasal spray PLACE 2 SPRAYS INTO BOTH NOSTRILS DAILY. (Patient not taking: Reported on 08/21/2021)  ? gabapentin (NEURONTIN) 300 MG capsule Take 1 capsule (300 mg total) by mouth at bedtime. (Patient not taking: Reported on 08/21/2021)  ? glucose blood (TRUE METRIX BLOOD GLUCOSE TEST) test strip Use as instructed to check blood sugar up to 3 times daily.  ? levothyroxine (SYNTHROID) 50 MCG tablet TAKE 1 TABLET BY MOUTH DAILY  ? Lidocaine (HM LIDOCAINE PATCH) 4 % PTCH Apply 1 patch topically every 12 (twelve) hours as needed.  ? losartan (COZAAR) 25 MG tablet TAKE ONE-HALF (1/2) TABLET DAILY  ? metFORMIN (GLUCOPHAGE-XR) 500 MG 24 hr tablet TAKE 2 TABLETS DAILY  ? methocarbamol (ROBAXIN) 500 MG tablet Take 1 tablet (500 mg total) by mouth 2 (two) times daily as needed.  ? metoprolol succinate (TOPROL-XL) 25 MG 24 hr tablet TAKE 1 TABLET DAILY  ? Multiple Vitamin (MULTIVITAMIN ADULT PO) Take by mouth.  ? oxyCODONE-acetaminophen (PERCOCET/ROXICET) 5-325 MG tablet Take 1 tablet by mouth every 8 (eight) hours as needed for up to 3 days for severe pain.  ? predniSONE (DELTASONE) 5 MG tablet TAKE 6 TABLETS BY MOUTH ON DAY 1 THEN DECREASE BY 1 TABLET DAILY (6-5-4-3-2-1)  ? sertraline (ZOLOFT) 100 MG tablet TAKE 1 TABLET DAILY  ? TRUEPLUS LANCETS 28G MISC Use to check blood sugar up to 3 times daily.  ? ?No facility-administered encounter medications on file as of 09/04/2021.  ? ? ?  ?Observations/Objective: ?No direct observation done as this was a telephone visit. ?Results for orders placed or performed during the hospital encounter of 09/03/21  ?CBC  ?Result Value Ref Range  ? WBC 5.9 4.0 - 10.5 K/uL  ? RBC 4.35 3.87 - 5.11 MIL/uL  ? Hemoglobin 12.5 12.0 - 15.0 g/dL  ? HCT 39.2 36.0 - 46.0 %  ? MCV 90.1 80.0 - 100.0 fL  ? MCH 28.7 26.0 - 34.0 pg  ? MCHC 31.9 30.0 - 36.0 g/dL  ? RDW 13.2 11.5 - 15.5 %  ? Platelets 268 150 - 400 K/uL  ? nRBC 0.0 0.0 - 0.2 %  ?Comprehensive metabolic panel  ?Result Value Ref Range  ? Sodium  138 135 - 145 mmol/L  ? Potassium 3.7 3.5 - 5.1 mmol/L  ? Chloride 104 98 - 111 mmol/L  ? CO2 27 22 - 32 mmol/L  ? Glucose, Bld 165 (H) 70 - 99 mg/dL  ? BUN 15 6 - 20 mg/dL  ? Creatinine, Ser 1.17 (H) 0.44 - 1.00 mg/dL  ? Calcium 9.6 8.9 - 10.3 mg/dL  ? Total Protein 8.2 (H) 6.5 - 8.1 g/dL  ? Albumin 4.5 3.5 - 5.0 g/dL  ? AST 22 15 - 41 U/L  ? ALT 21 0 - 44 U/L  ? Alkaline Phosphatase 72  38 - 126 U/L  ? Total Bilirubin 0.6 0.3 - 1.2 mg/dL  ? GFR, Estimated 53 (L) >60 mL/min  ? Anion gap 7 5 - 15  ?Magnesium  ?Result Value Ref Range  ? Magnesium 2.0 1.7 - 2.4 mg/dL  ?CBG monitoring, ED  ?Result Value Ref Range  ? Glucose-Capillary 94 70 - 99 mg/dL  ?Troponin I (High Sensitivity)  ?Result Value Ref Range  ? Troponin I (High Sensitivity) <2 <18 ng/L  ? ? ? ?  ? ? ? ?Assessment and Plan: ? ?1. Syncope, unspecified syncope type ?-Suspect this may have been vasovagal given her work activities before and then the feeling of nausea and being hot shortly afterwards.  Inhaling the strong fumes also may have contributed.  However given that this resulted in an injury, I will refer her to cardiology to see whether she needs to wear a monitor as well. ?-Continue to stay hydrated. ? ?2. Closed fracture of one rib of left side, initial encounter ?Told patient that it can take 6 to 8 weeks for rib fractures to heal.  I will give her a refill on the Percocets for several weeks until fracture is healed and pain decrease.  Advise that the medication is a narcotic and can cause drowsiness.  Also went over the fact that it can be habit-forming or addictive if continued to be used long-term which we do not plan to do.  Patient expressed understanding.  [place] controlled substance reporting system reviewed. ? ?3. Essential hypertension ?Continue current medications.  Check blood pressure at least once a week with goal being 130/80 or lower.  Send me the results of blood pressure readings if she is running above that. ? ?Follow Up  Instructions: ?6 weeks ?  ?I discussed the assessment and treatment plan with the patient. The patient was provided an opportunity to ask questions and all were answered. The patient agreed with the p

## 2021-09-04 NOTE — Telephone Encounter (Signed)
Called and spoke with pt regarding Dr. Dayle Points recommendations. Explained reasoning for cancelling procedure. Pt verbalized understanding that this was for her safety. Office visit scheduled for 09/16/21 for pt to be evaluated by MD before rescheduling colonoscopy. Colonoscopy cancelled for 09/05/21.

## 2021-09-04 NOTE — Telephone Encounter (Signed)
Pt called and stated that she was seen in the ED yesterday after a fall and sustained two rib fractures. Pt started on Percocet for pain and called to ask if she could still have the colonoscopy tomorrow. RN instructed that the medication would not interfere with the procedure/sedation, however after reviewing the chart it was noted that the pt had a syncopal event that caused the fall. Dr. Candis Schatz please see ED note from 09/03/21 and advise if pt should proceed with colonoscopy on 09/05/21 or if she should reschedule/ be evaluated before rescheduling.  ?

## 2021-09-04 NOTE — Telephone Encounter (Signed)
Please call pt for phone appt today 3/23 at 11:10 see appt notes, pt needs to keep appt, fell and broke ribs yesterday call [phone] Can not reach office  ?

## 2021-09-04 NOTE — Telephone Encounter (Signed)
noted 

## 2021-09-05 ENCOUNTER — Encounter: Payer: Self-pay | Admitting: Internal Medicine

## 2021-09-05 ENCOUNTER — Encounter: Payer: BC Managed Care – PPO | Admitting: Gastroenterology

## 2021-09-05 NOTE — Telephone Encounter (Signed)
Copied from [place] [id number]. Topic: General - Other ?>> Sep 05, 2021 11:45 AM Heather Bailey A wrote: ?Reason for CRM: The patient would like to speak with a member of clinical staff when possible ? ?The patient is requesting a note to return to work  ? ?The patient shares that they will need accommodations for their return to work and would like to discuss this further before the note is written  ? ?Please contact further ?

## 2021-09-09 ENCOUNTER — Telehealth: Payer: Self-pay | Admitting: Internal Medicine

## 2021-09-09 NOTE — Telephone Encounter (Signed)
Tried contacting pt pt didn't answer ? ?Sent pt a MyChart message  ?

## 2021-09-09 NOTE — Telephone Encounter (Signed)
Copied from [place] [id number]. Topic: General - Other >> Sep 09, 2021 10:45 AM Yvette Rack wrote: Reason for CRM: Pt stated she sent a [facility] message regarding leave of absence but she has yet to hear back from anyone. Pt stated her employer advised that the case will be closed if no response within 15 days starting from last week. Pt asked if the form from her employer was received and she requests call back. Cb# [phone]

## 2021-09-10 ENCOUNTER — Telehealth: Payer: Self-pay

## 2021-09-10 ENCOUNTER — Telehealth: Payer: Self-pay | Admitting: Internal Medicine

## 2021-09-10 ENCOUNTER — Other Ambulatory Visit: Payer: Self-pay

## 2021-09-10 NOTE — Telephone Encounter (Signed)
Lmovm for the pt to call the office and schedule a 6 week follow up from her last appt. She can call the office to schedule  ?

## 2021-09-10 NOTE — Telephone Encounter (Signed)
PC placed to pt today several times to discuss FMLA form that was sent to me.  Unable to leave message on cell as MB full.  Called and left 2 messages on home phone informing of who I am and reason for calling.  Request call back to discuss length of leave.   ?

## 2021-09-10 NOTE — Telephone Encounter (Signed)
Pt called in. I relayed the message, She states that 8 weeks will be better for her. Please call back ?

## 2021-09-10 NOTE — Telephone Encounter (Signed)
PA for Oxycodone-Acetaminophen approved until 09/10/22 ?

## 2021-09-11 NOTE — Telephone Encounter (Signed)
PC returned to pt today.  Pt reports her job is very physical involving walking up and down stairs though not lifting.  I told her it takes about 6 wks for rib fractures to heal and pain should get better with time.  She is requesting 8 weeks off to fully heal.  This would put her back at work 11/03/2021.  I told pt I will complete her FMLA with return date of 5/22/203. ?

## 2021-09-16 ENCOUNTER — Ambulatory Visit: Payer: BC Managed Care – PPO | Admitting: Gastroenterology

## 2021-09-16 DIAGNOSIS — E119 Type 2 diabetes mellitus without complications: Secondary | ICD-10-CM | POA: Diagnosis not present

## 2021-09-16 DIAGNOSIS — H25813 Combined forms of age-related cataract, bilateral: Secondary | ICD-10-CM | POA: Diagnosis not present

## 2021-09-16 DIAGNOSIS — H5202 Hypermetropia, left eye: Secondary | ICD-10-CM | POA: Diagnosis not present

## 2021-09-16 DIAGNOSIS — H35033 Hypertensive retinopathy, bilateral: Secondary | ICD-10-CM | POA: Diagnosis not present

## 2021-09-16 DIAGNOSIS — H40013 Open angle with borderline findings, low risk, bilateral: Secondary | ICD-10-CM | POA: Diagnosis not present

## 2021-09-16 LAB — HM DIABETES EYE EXAM

## 2021-09-18 ENCOUNTER — Ambulatory Visit (INDEPENDENT_AMBULATORY_CARE_PROVIDER_SITE_OTHER): Payer: BC Managed Care – PPO | Admitting: Internal Medicine

## 2021-09-18 ENCOUNTER — Encounter: Payer: Self-pay | Admitting: Internal Medicine

## 2021-09-18 VITALS — BP 118/80 | HR 73 | Ht 66.0 in | Wt 195.2 lb

## 2021-09-18 DIAGNOSIS — R55 Syncope and collapse: Secondary | ICD-10-CM

## 2021-09-18 NOTE — Patient Instructions (Addendum)
Medication Instructions:  ?No Changes In Medications at this time.  ?*If you need a refill on your cardiac medications before your next appointment, please call your pharmacy* ? ?Follow-Up: ?At [facility], you and your health needs are our priority.  As part of our continuing mission to provide you with exceptional heart care, we have created designated Provider Care Teams.  These Care Teams include your primary Cardiologist (physician) and Advanced Practice Providers (APPs -  Physician Assistants and Nurse Practitioners) who all work together to provide you with the care you need, when you need it. ? ?Your next appointment:   ?AS NEEDED  ? ?The format for your next appointment:   ?In Person ? ?Provider:   ?Janina Mayo, MD \  ? ?Other Instructions ? Symptoms associated with vasovagal syncope. We discussed that certain triggers can lower your heart rates and blood pressure. Your lab tests and EKG were normal. You have no signs of heart disease. ?

## 2021-09-18 NOTE — Progress Notes (Signed)
?Cardiology Office Note:   ? ?Date:  09/18/2021  ? ?ID:  Heather Bailey, DOB 03-22-1961, MRN [id number] ? ?PCP:  Heather Pier, MD ?  ?[facility] Providers ?Cardiologist:  None    ? ?Referring MD: Heather Pier, MD  ? ?No chief complaint on file. ?Syncope ? ?History of Present Illness:   ? ?Heather Bailey is a 61 y.o. female with a hx of hypertension, hyperlipidemia, DVT, hypothyroidism on synthroid (TSH is normal), gestational diabetes referral from the ED for syncope ? ?Per ED documentation on 09/03/2021:  Patient reports that single episode occurred while she was at work.  She works in [employer]. This morning at 2 AM she was sitting at her desk when she started to feel nauseous and became hot. There were fumes in the office  Patient reports that she was walking to the bathroom when she had a syncopal episode.  Patient endorses falling to the floor.  Patient is on longer sure how long she was unconscious for.  Has full recollection of the events after regaining consciousness.  Patient denies any preceding chest pain, palpitations, shortness of breath, sudden onset of headache.  Patient reports that she has had pain to left hip and left chest wall after the injury.  Pain is worse with touch and movement.  Patient took medication immediately after fall but has not taken anything since. She was noted to have chest wall pain. Her episode was c/f vasovagal syncope ? ?This was a one time incident. She has no cardiac dx history. She had a fainting spell in 1996 when she was pregnant.  She was at work, and fainting with a prodrome before going to the bathroom. She denies persistent LH or dizziness.  Father had an MI and passed. No other cardiac known cardiac dx. She had a DVT/PE in 2006. She was on lovenox.  Her blood pressure is well controlled ? ?The 10-year ASCVD risk score (Arnett DK, et al., 2019) is: 11.9% ?  Values used to calculate the score: ?    Age: 27 years ?    Sex: Female ?    Is Non-Hispanic  African American: Yes ?    Diabetic: Yes ?    Tobacco smoker: No ?    Systolic Blood Pressure: 681 mmHg ?    Is BP treated: Yes ?    HDL Cholesterol: 40 mg/dL ?    Total Cholesterol: 155 mg/dL ? ? ?Cardiology Studies: ?EKG 09/05/2021- NSR ? ?Past Medical History:  ?Diagnosis Date  ? Anemia Dx 2003  ? Arthritis Dx 1999  ? DVT (deep venous thrombosis) ([place])   ? Fibroids   ? Gestational diabetes   ? Hyperlipidemia Dx 2000  ? Hypertension Dx 2000  ? Hypertensive retinopathy   ? Thyroid disease Dx 2005  ? ? ?Past Surgical History:  ?Procedure Laterality Date  ? CESAREAN SECTION    ? MYOMECTOMY    ? TUBAL LIGATION    ? ? ?Current Medications: ?No outpatient medications have been marked as taking for the 09/18/21 encounter (Appointment) with Heather Mayo, MD.  ?  ? ?Allergies:   Zithromax [azithromycin dihydrate]  ? ?Social History  ? ?Socioeconomic History  ? Marital status: Married  ?  Spouse name: Not on file  ? Number of children: 2  ? Years of education: Not on file  ? Highest education level: Bachelor's degree (e.g., BA, AB, BS)  ?Occupational History  ? Not on file  ?Tobacco Use  ? Smoking status:  Never  ? Smokeless tobacco: Never  ?Vaping Use  ? Vaping Use: Never used  ?Substance and Sexual Activity  ? Alcohol use: No  ? Drug use: No  ? Sexual activity: Not Currently  ?  Birth control/protection: Surgical  ?Other Topics Concern  ? Not on file  ?Social History Narrative  ? Lives with husband who has diabetes.   ? ?Social Determinants of Health  ? ?Financial Resource Strain: Not on file  ?Food Insecurity: Not on file  ?Transportation Needs: Not on file  ?Physical Activity: Not on file  ?Stress: Not on file  ?Social Connections: Not on file  ?  ? ?Family History: ?The patient's family history includes Alzheimer's disease in her sister; Cancer in her mother; Hypertension in her mother. There is no history of Colon cancer, Colon polyps, Esophageal cancer, Stomach cancer, or Rectal cancer. ? ?ROS:   ?Please see the  history of present illness.    ? All other systems reviewed and are negative. ? ?EKGs/Labs/Other Studies Reviewed:   ? ?The following studies were reviewed today: ? ? ?EKG:  EKG is  ordered today.  The ekg ordered today demonstrates  ?EKG 09/18/2021- NSR  ? ?Recent Labs: ?05/05/2021: TSH 3.330 ?09/03/2021: ALT 21; BUN 15; Creatinine, Ser 1.17; Hemoglobin 12.5; Magnesium 2.0; Platelets 268; Potassium 3.7; Sodium 138  ?Recent Lipid Panel ?   ?Component Value Date/Time  ? CHOL 155 11/06/2020 0924  ? TRIG 119 11/06/2020 0924  ? HDL 40 11/06/2020 0924  ? CHOLHDL 3.9 11/06/2020 0924  ? CHOLHDL 4.3 12/19/2015 1620  ? VLDL 20 12/19/2015 1620  ? [place] 93 11/06/2020 0924  ? ? ? ?Risk Assessment/Calculations:   ?  ? ?    ? ?Physical Exam:   ? ?VS:  There were no vitals taken for this visit.   ? ?Wt Readings from Last 3 Encounters:  ?09/03/21 194 lb (88 kg)  ?08/21/21 194 lb (88 kg)  ?05/05/21 199 lb 12.8 oz (90.6 kg)  ?  ? ?GEN:  Well nourished, well developed in no acute distress ?HEENT: Normal ?NECK: No JVD; No carotid bruits ?LYMPHATICS: No lymphadenopathy ?CARDIAC: RRR, no murmurs, rubs, gallops ?RESPIRATORY:  Clear to auscultation without rales, wheezing or rhonchi  ?ABDOMEN: Soft, non-tender, non-distended ?MUSCULOSKELETAL:  No edema; No deformity  ?SKIN: Warm and dry ?NEUROLOGIC:  Alert and oriented x 3 ?PSYCHIATRIC:  Normal affect  ? ?ASSESSMENT:   ?Syncope-  Symptoms associated with vasovagal syncope with prodrome. We discussed knowing triggers, and lowering to the floor if he notes these triggers, as well as staying hydrated. She does not have high risk features including syncope c/f arrhythmia , family hx of SCD, or abnormalities on her EKG.Continue thyroid dx management. ? ?HLD-  at goal. Continue atorvastatin 20 mg daily ? ?HTN- continue losartan 25 mg daily and metop XL 25 mg daily ? ?PLAN:   ? ?In order of problems listed above: ? ?No changes ?Follow up PRN ? ?   ? ?   ? ? ?Medication Adjustments/Labs and Tests  Ordered: ?Current medicines are reviewed at length with the patient today.  Concerns regarding medicines are outlined above.  ?No orders of the defined types were placed in this encounter. ? ?No orders of the defined types were placed in this encounter. ? ? ?There are no Patient Instructions on file for this visit.  ? ?Signed, ?Heather Mayo, MD  ?09/18/2021 9:55 AM    ?[place] ?

## 2021-09-26 ENCOUNTER — Other Ambulatory Visit: Payer: Self-pay | Admitting: Internal Medicine

## 2021-09-26 DIAGNOSIS — E1169 Type 2 diabetes mellitus with other specified complication: Secondary | ICD-10-CM

## 2021-09-26 DIAGNOSIS — I10 Essential (primary) hypertension: Secondary | ICD-10-CM

## 2021-09-26 NOTE — Telephone Encounter (Signed)
Requested medication (s) are due for refill today: yes ? ?Requested medication (s) are on the active medication list: yes ? ?Last refill:  03/31/21 #180 1 refills ? ?Future visit scheduled: yes in 3 weeks ? ?Notes to clinic:  failed protocol, last HG A1c 02/06/21, last CBC 09/03/21. Do you want to refill Rx? ? ? ?  ?Requested Prescriptions  ?Pending Prescriptions Disp Refills  ? metFORMIN (GLUCOPHAGE-XR) 500 MG 24 hr tablet [Pharmacy Med Name: METFORMIN HCL ER TABS 500MG] 180 tablet 3  ?  Sig: TAKE 2 TABLETS DAILY  ?  ? Endocrinology:  Diabetes - Biguanides Failed - 09/26/2021  1:32 AM  ?  ?  Failed - Cr in normal range and within 360 days  ?  Creat  ?Date Value Ref Range Status  ?12/19/2015 1.10 (H) 0.50 - 1.05 mg/dL Final  ?  Comment:  ?    ?For patients > or = 61 years of age: The upper reference limit for ?Creatinine is approximately 13% higher for people identified as ?African-American. ?  ?  ? ?Creatinine, Ser  ?Date Value Ref Range Status  ?09/03/2021 1.17 (H) 0.44 - 1.00 mg/dL Final  ? ?Creatinine, Urine  ?Date Value Ref Range Status  ?08/21/2016 101 20 - 320 mg/dL Final  ?  ?  ?  ?  Failed - HBA1C is between 0 and 7.9 and within 180 days  ?  HbA1c, POC (prediabetic range)  ?Date Value Ref Range Status  ?08/15/2018 6.1 5.7 - 6.4 % Final  ? ?Hgb A1c MFr Bld  ?Date Value Ref Range Status  ?02/06/2021 6.3 (H) 4.8 - 5.6 % Final  ?  Comment:  ?           Prediabetes: 5.7 - 6.4 ?         Diabetes: >6.4 ?         Glycemic control for adults with diabetes: <7.0 ?  ?  ?  ?  ?  Failed - eGFR in normal range and within 360 days  ?  GFR, Est African American  ?Date Value Ref Range Status  ?12/19/2015 65 >=60 mL/min Final  ? ?GFR calc Af Amer  ?Date Value Ref Range Status  ?05/03/2020 60 >59 mL/min/1.73 Final  ?  Comment:  ?  **In accordance with recommendations from the NKF-ASN Task force,** ?  [facility] is in the process of updating its eGFR calculation to the ?  2021 CKD-EPI creatinine equation that estimates kidney  function ?  without a race variable. ?  ? ?GFR, Est Non African American  ?Date Value Ref Range Status  ?12/19/2015 57 (L) >=60 mL/min Final  ? ?GFR, Estimated  ?Date Value Ref Range Status  ?09/03/2021 53 (L) >60 mL/min Final  ?  Comment:  ?  (NOTE) ?Calculated using the CKD-EPI Creatinine Equation (2021) ?  ? ?eGFR  ?Date Value Ref Range Status  ?11/06/2020 53 (L) >59 mL/min/1.73 Final  ?  ?  ?  ?  Failed - B12 Level in normal range and within 720 days  ?  No results found for: VITAMINB12  ?  ?  ?  Failed - CBC within normal limits and completed in the last 12 months  ?  WBC  ?Date Value Ref Range Status  ?09/03/2021 5.9 4.0 - 10.5 K/uL Final  ? ?RBC  ?Date Value Ref Range Status  ?09/03/2021 4.35 3.87 - 5.11 MIL/uL Final  ? ?Hemoglobin  ?Date Value Ref Range Status  ?09/03/2021 12.5 12.0 - 15.0 g/dL  Final  ?05/03/2020 13.0 11.1 - 15.9 g/dL Final  ? ?HCT  ?Date Value Ref Range Status  ?09/03/2021 39.2 36.0 - 46.0 % Final  ? ?Hematocrit  ?Date Value Ref Range Status  ?05/03/2020 40.2 34.0 - 46.6 % Final  ? ?MCHC  ?Date Value Ref Range Status  ?09/03/2021 31.9 30.0 - 36.0 g/dL Final  ? ?MCH  ?Date Value Ref Range Status  ?09/03/2021 28.7 26.0 - 34.0 pg Final  ? ?MCV  ?Date Value Ref Range Status  ?09/03/2021 90.1 80.0 - 100.0 fL Final  ?05/03/2020 88 79 - 97 fL Final  ? ?No results found for: PLTCOUNTKUC, LABPLAT, [place] ?RDW  ?Date Value Ref Range Status  ?09/03/2021 13.2 11.5 - 15.5 % Final  ?05/03/2020 13.1 11.7 - 15.4 % Final  ? ?  ?  ?  Passed - Valid encounter within last 6 months  ?  Recent Outpatient Visits   ? ?      ? 3 weeks ago Syncope, unspecified syncope type  ? [place] Ladell Pier, MD  ? 4 months ago Annual physical exam  ? [place] Ladell Pier, MD  ? 7 months ago Type 2 diabetes mellitus with obesity ([place])  ? [place] Ladell Pier, MD  ? 10 months ago Type 2 diabetes mellitus with  obesity ([place])  ? [place], [place], [place]  ? 1 year ago Type 2 diabetes mellitus with obesity ([place])  ? [place], [place], [place]  ? ?  ?  ?Future Appointments   ? ?        ? In 3 weeks Ladell Pier, MD [place]  ? ?  ? ?  ?  ?  ?Signed Prescriptions Disp Refills  ? losartan (COZAAR) 25 MG tablet 45 tablet 1  ?  Sig: TAKE ONE-HALF (1/2) TABLET DAILY  ?  ? Cardiovascular:  Angiotensin Receptor Blockers Failed - 09/26/2021  1:32 AM  ?  ?  Failed - Cr in normal range and within 180 days  ?  Creat  ?Date Value Ref Range Status  ?12/19/2015 1.10 (H) 0.50 - 1.05 mg/dL Final  ?  Comment:  ?    ?For patients > or = 61 years of age: The upper reference limit for ?Creatinine is approximately 13% higher for people identified as ?African-American. ?  ?  ? ?Creatinine, Ser  ?Date Value Ref Range Status  ?09/03/2021 1.17 (H) 0.44 - 1.00 mg/dL Final  ? ?Creatinine, Urine  ?Date Value Ref Range Status  ?08/21/2016 101 20 - 320 mg/dL Final  ?  ?  ?  ?  Passed - K in normal range and within 180 days  ?  Potassium  ?Date Value Ref Range Status  ?09/03/2021 3.7 3.5 - 5.1 mmol/L Final  ?  ?  ?  ?  Passed - Patient is not pregnant  ?  ?  Passed - Last BP in normal range  ?  BP Readings from Last 1 Encounters:  ?09/18/21 118/80  ?  ?  ?  ?  Passed - Valid encounter within last 6 months  ?  Recent Outpatient Visits   ? ?      ? 3 weeks ago Syncope, unspecified syncope type  ? [place] Ladell Pier, MD  ? 4 months ago  Annual physical exam  ? [place] Karle Plumber B, MD  ? 7 months ago Type 2 diabetes mellitus with obesity ([place])  ? [place] Ladell Pier, MD  ? 10 months ago Type 2 diabetes mellitus with obesity ([place])  ? [place], [place], [place]  ? 1 year ago Type 2  diabetes mellitus with obesity ([place])  ? [place], [place], [place]  ? ?  ?  ?Future Appointments   ? ?        ? In 3 weeks Ladell Pier, MD [place]  ? ?  ? ?  ?  ?  ? metoprolol succinate (TOPROL-XL) 25 MG 24 hr tablet 90 tablet 1  ?  Sig: TAKE 1 TABLET DAILY  ?  ? Cardiovascular:  Beta Blockers Passed - 09/26/2021  1:32 AM  ?  ?  Passed - Last BP in normal range  ?  BP Readings from Last 1 Encounters:  ?09/18/21 118/80  ?  ?  ?  ?  Passed - Last Heart Rate in normal range  ?  Pulse Readings from Last 1 Encounters:  ?09/18/21 73  ?  ?  ?  ?  Passed - Valid encounter within last 6 months  ?  Recent Outpatient Visits   ? ?      ? 3 weeks ago Syncope, unspecified syncope type  ? [place] Ladell Pier, MD  ? 4 months ago Annual physical exam  ? [place] Ladell Pier, MD  ? 7 months ago Type 2 diabetes mellitus with obesity ([place])  ? [place] Ladell Pier, MD  ? 10 months ago Type 2 diabetes mellitus with obesity ([place])  ? [place], [place], [place]  ? 1 year ago Type 2 diabetes mellitus with obesity ([place])  ? [place], [place], [place]  ? ?  ?  ?Future Appointments   ? ?        ? In 3 weeks Ladell Pier, MD [place]  ? ?  ? ?  ?  ?  ? ?

## 2021-09-26 NOTE — Telephone Encounter (Signed)
Requested Prescriptions  ?Pending Prescriptions Disp Refills  ?? metFORMIN (GLUCOPHAGE-XR) 500 MG 24 hr tablet [Pharmacy Med Name: METFORMIN HCL ER TABS 500MG] 180 tablet 3  ?  Sig: TAKE 2 TABLETS DAILY  ?  ? Endocrinology:  Diabetes - Biguanides Failed - 09/26/2021  1:32 AM  ?  ?  Failed - Cr in normal range and within 360 days  ?  Creat  ?Date Value Ref Range Status  ?12/19/2015 1.10 (H) 0.50 - 1.05 mg/dL Final  ?  Comment:  ?    ?For patients > or = 61 years of age: The upper reference limit for ?Creatinine is approximately 13% higher for people identified as ?African-American. ?  ?  ? ?Creatinine, Ser  ?Date Value Ref Range Status  ?09/03/2021 1.17 (H) 0.44 - 1.00 mg/dL Final  ? ?Creatinine, Urine  ?Date Value Ref Range Status  ?08/21/2016 101 20 - 320 mg/dL Final  ?   ?  ?  Failed - HBA1C is between 0 and 7.9 and within 180 days  ?  HbA1c, POC (prediabetic range)  ?Date Value Ref Range Status  ?08/15/2018 6.1 5.7 - 6.4 % Final  ? ?Hgb A1c MFr Bld  ?Date Value Ref Range Status  ?02/06/2021 6.3 (H) 4.8 - 5.6 % Final  ?  Comment:  ?           Prediabetes: 5.7 - 6.4 ?         Diabetes: >6.4 ?         Glycemic control for adults with diabetes: <7.0 ?  ?   ?  ?  Failed - eGFR in normal range and within 360 days  ?  GFR, Est African American  ?Date Value Ref Range Status  ?12/19/2015 65 >=60 mL/min Final  ? ?GFR calc Af Amer  ?Date Value Ref Range Status  ?05/03/2020 60 >59 mL/min/1.73 Final  ?  Comment:  ?  **In accordance with recommendations from the NKF-ASN Task force,** ?  [facility] is in the process of updating its eGFR calculation to the ?  2021 CKD-EPI creatinine equation that estimates kidney function ?  without a race variable. ?  ? ?GFR, Est Non African American  ?Date Value Ref Range Status  ?12/19/2015 57 (L) >=60 mL/min Final  ? ?GFR, Estimated  ?Date Value Ref Range Status  ?09/03/2021 53 (L) >60 mL/min Final  ?  Comment:  ?  (NOTE) ?Calculated using the CKD-EPI Creatinine Equation (2021) ?  ? ?eGFR  ?Date  Value Ref Range Status  ?11/06/2020 53 (L) >59 mL/min/1.73 Final  ?   ?  ?  Failed - B12 Level in normal range and within 720 days  ?  No results found for: VITAMINB12   ?  ?  Failed - CBC within normal limits and completed in the last 12 months  ?  WBC  ?Date Value Ref Range Status  ?09/03/2021 5.9 4.0 - 10.5 K/uL Final  ? ?RBC  ?Date Value Ref Range Status  ?09/03/2021 4.35 3.87 - 5.11 MIL/uL Final  ? ?Hemoglobin  ?Date Value Ref Range Status  ?09/03/2021 12.5 12.0 - 15.0 g/dL Final  ?05/03/2020 13.0 11.1 - 15.9 g/dL Final  ? ?HCT  ?Date Value Ref Range Status  ?09/03/2021 39.2 36.0 - 46.0 % Final  ? ?Hematocrit  ?Date Value Ref Range Status  ?05/03/2020 40.2 34.0 - 46.6 % Final  ? ?MCHC  ?Date Value Ref Range Status  ?09/03/2021 31.9 30.0 - 36.0 g/dL Final  ? ?MCH  ?  Date Value Ref Range Status  ?09/03/2021 28.7 26.0 - 34.0 pg Final  ? ?MCV  ?Date Value Ref Range Status  ?09/03/2021 90.1 80.0 - 100.0 fL Final  ?05/03/2020 88 79 - 97 fL Final  ? ?No results found for: PLTCOUNTKUC, LABPLAT, [place] ?RDW  ?Date Value Ref Range Status  ?09/03/2021 13.2 11.5 - 15.5 % Final  ?05/03/2020 13.1 11.7 - 15.4 % Final  ? ?  ?  ?  Passed - Valid encounter within last 6 months  ?  Recent Outpatient Visits   ?      ? 3 weeks ago Syncope, unspecified syncope type  ? [place] Ladell Pier, MD  ? 4 months ago Annual physical exam  ? [place] Ladell Pier, MD  ? 7 months ago Type 2 diabetes mellitus with obesity ([place])  ? [place] Ladell Pier, MD  ? 10 months ago Type 2 diabetes mellitus with obesity ([place])  ? [place], [place], [place]  ? 1 year ago Type 2 diabetes mellitus with obesity ([place])  ? [place], [place], [place]  ?  ?  ?Future Appointments   ?        ? In 3 weeks Ladell Pier, MD [place]  ?   ? ?  ?  ?  ?? losartan (COZAAR) 25 MG tablet [Pharmacy Med Name: LOSARTAN TABS 25MG] 45 tablet 1  ?  Sig: TAKE ONE-HALF (1/2) TABLET DAILY  ?  ? Cardiovascular:  Angiotensin Receptor Blockers Failed - 09/26/2021  1:32 AM  ?  ?  Failed - Cr in normal range and within 180 days  ?  Creat  ?Date Value Ref Range Status  ?12/19/2015 1.10 (H) 0.50 - 1.05 mg/dL Final  ?  Comment:  ?    ?For patients > or = 61 years of age: The upper reference limit for ?Creatinine is approximately 13% higher for people identified as ?African-American. ?  ?  ? ?Creatinine, Ser  ?Date Value Ref Range Status  ?09/03/2021 1.17 (H) 0.44 - 1.00 mg/dL Final  ? ?Creatinine, Urine  ?Date Value Ref Range Status  ?08/21/2016 101 20 - 320 mg/dL Final  ?   ?  ?  Passed - K in normal range and within 180 days  ?  Potassium  ?Date Value Ref Range Status  ?09/03/2021 3.7 3.5 - 5.1 mmol/L Final  ?   ?  ?  Passed - Patient is not pregnant  ?  ?  Passed - Last BP in normal range  ?  BP Readings from Last 1 Encounters:  ?09/18/21 118/80  ?   ?  ?  Passed - Valid encounter within last 6 months  ?  Recent Outpatient Visits   ?      ? 3 weeks ago Syncope, unspecified syncope type  ? [place] Ladell Pier, MD  ? 4 months ago Annual physical exam  ? [place] Ladell Pier, MD  ? 7 months ago Type 2 diabetes mellitus with obesity ([place])  ? [place] Ladell Pier, MD  ? 10 months ago Type 2 diabetes mellitus with obesity ([place])  ? [place], [place], [place]  ? 1 year ago Type 2 diabetes  mellitus with obesity ([place])  ? [place], [place], [place]  ?  ?  ?Future Appointments   ?        ? In 3 weeks Ladell Pier, MD [place]  ?  ? ?  ?  ?  ?? metoprolol succinate (TOPROL-XL) 25 MG 24 hr tablet [Pharmacy Med Name: METOPROLOL SUCCINATE ER TABS  25MG] 90 tablet 1  ?  Sig: TAKE 1 TABLET DAILY  ?  ? Cardiovascular:  Beta Blockers Passed - 09/26/2021  1:32 AM  ?  ?  Passed - Last BP in normal range  ?  BP Readings from Last 1 Encounters:  ?09/18/21 118/80  ?   ?  ?  Passed - Last Heart Rate in normal range  ?  Pulse Readings from Last 1 Encounters:  ?09/18/21 73  ?   ?  ?  Passed - Valid encounter within last 6 months  ?  Recent Outpatient Visits   ?      ? 3 weeks ago Syncope, unspecified syncope type  ? [place] Ladell Pier, MD  ? 4 months ago Annual physical exam  ? [place] Ladell Pier, MD  ? 7 months ago Type 2 diabetes mellitus with obesity ([place])  ? [place] Ladell Pier, MD  ? 10 months ago Type 2 diabetes mellitus with obesity ([place])  ? [place], [place], [place]  ? 1 year ago Type 2 diabetes mellitus with obesity ([place])  ? [employer] [place], [place], [place]  ?  ?  ?Future Appointments   ?        ? In 3 weeks Ladell Pier, MD [place]  ?  ? ?  ?  ?  ? ?

## 2021-10-02 ENCOUNTER — Ambulatory Visit (AMBULATORY_SURGERY_CENTER): Payer: BC Managed Care – PPO | Admitting: Internal Medicine

## 2021-10-02 ENCOUNTER — Ambulatory Visit: Payer: BC Managed Care – PPO | Admitting: Gastroenterology

## 2021-10-02 ENCOUNTER — Encounter: Payer: Self-pay | Admitting: Internal Medicine

## 2021-10-02 VITALS — BP 133/77 | HR 71 | Temp 95.9°F | Resp 13 | Ht 66.0 in | Wt 194.0 lb

## 2021-10-02 DIAGNOSIS — D122 Benign neoplasm of ascending colon: Secondary | ICD-10-CM | POA: Diagnosis not present

## 2021-10-02 DIAGNOSIS — Z1211 Encounter for screening for malignant neoplasm of colon: Secondary | ICD-10-CM

## 2021-10-02 DIAGNOSIS — D125 Benign neoplasm of sigmoid colon: Secondary | ICD-10-CM | POA: Diagnosis not present

## 2021-10-02 DIAGNOSIS — K635 Polyp of colon: Secondary | ICD-10-CM | POA: Diagnosis not present

## 2021-10-02 MED ORDER — SODIUM CHLORIDE 0.9 % IV SOLN: 500.0000 mL | Freq: Once | INTRAVENOUS | Status: DC

## 2021-10-02 NOTE — Progress Notes (Signed)
? ?GASTROENTEROLOGY PROCEDURE H&P NOTE  ? ?Primary Care Physician: ?Ladell Pier, MD ? ? ? ?Reason for Procedure:   Colon cancer screening ? ?Plan:    Colonoscopy ? ?Patient is appropriate for endoscopic procedure(s) in the ambulatory ([facility]) setting. ? ?The nature of the procedure, as well as the risks, benefits, and alternatives were carefully and thoroughly reviewed with the patient. Ample time for discussion and questions allowed. The patient understood, was satisfied, and agreed to proceed.  ? ? ? ?HPI: ?Heather Bailey is a 61 y.o. female who presents for colonoscopy for colon cancer screening. Denies blood in stools, changes in bowel habits, weight loss. Mother had likely cervical cancer that spread to her colon. Denies other fam hx of colon cancer. Denies use of blood thinners. ? ?Past Medical History:  ?Diagnosis Date  ? Anemia Dx 2003  ? Arthritis Dx 1999  ? DVT (deep venous thrombosis) ([place])   ? Fibroids   ? Gestational diabetes   ? Hyperlipidemia Dx 2000  ? Hypertension Dx 2000  ? Hypertensive retinopathy   ? Thyroid disease Dx 2005  ? ? ?Past Surgical History:  ?Procedure Laterality Date  ? CESAREAN SECTION    ? MYOMECTOMY    ? TUBAL LIGATION    ? ? ?Prior to Admission medications   ?Medication Sig Start Date End Date Taking? Authorizing Provider  ?atorvastatin (LIPITOR) 40 MG tablet TAKE ONE-HALF (1/2) TABLET DAILY 02/18/21  Yes Ladell Pier, MD  ?glucose blood (TRUE METRIX BLOOD GLUCOSE TEST) test strip Use as instructed to check blood sugar up to 3 times daily. 08/17/18  Yes Ladell Pier, MD  ?levothyroxine (SYNTHROID) 50 MCG tablet TAKE 1 TABLET BY MOUTH DAILY 05/20/21  Yes Ladell Pier, MD  ?Lidocaine (HM LIDOCAINE PATCH) 4 % PTCH Apply 1 patch topically every 12 (twelve) hours as needed. 09/03/21  Yes Loni Beckwith, PA-C  ?losartan (COZAAR) 25 MG tablet TAKE ONE-HALF (1/2) TABLET DAILY 09/26/21  Yes Ladell Pier, MD  ?metFORMIN (GLUCOPHAGE-XR) 500 MG 24 hr tablet TAKE 2  TABLETS DAILY 09/26/21  Yes Ladell Pier, MD  ?methocarbamol (ROBAXIN) 500 MG tablet Take 1 tablet (500 mg total) by mouth 2 (two) times daily as needed. 03/27/21  Yes Aundra Dubin, PA-C  ?metoprolol succinate (TOPROL-XL) 25 MG 24 hr tablet TAKE 1 TABLET DAILY 09/26/21  Yes Ladell Pier, MD  ?Multiple Vitamin (MULTIVITAMIN ADULT PO) Take by mouth.   Yes [provider]  ?oxyCODONE-acetaminophen (PERCOCET) 5-325 MG tablet Take 1 tablet by mouth every 8 (eight) hours as needed for severe pain. 09/04/21  Yes Ladell Pier, MD  ?sertraline (ZOLOFT) 100 MG tablet TAKE 1 TABLET DAILY 06/30/21  Yes Ladell Pier, MD  ?TRUEPLUS LANCETS 28G MISC Use to check blood sugar up to 3 times daily. 08/17/18  Yes Ladell Pier, MD  ?cycloSPORINE (RESTASIS) 0.05 % ophthalmic emulsion Instill 1 drop in both eyes twice a day 10/31/20     ?diclofenac sodium (VOLTAREN) 1 % GEL Apply 2 g topically 4 (four) times daily. ?Patient not taking: Reported on 09/18/2021 03/03/19   Fulp, Cammie, MD  ?fluticasone (FLONASE) 50 MCG/ACT nasal spray PLACE 2 SPRAYS INTO BOTH NOSTRILS DAILY. 11/06/20 11/06/21  Argentina Donovan, PA-C  ?gabapentin (NEURONTIN) 300 MG capsule Take 1 capsule (300 mg total) by mouth at bedtime. 11/06/20   Argentina Donovan, PA-C  ? ? ?Current Outpatient Medications  ?Medication Sig Dispense Refill  ? atorvastatin (LIPITOR) 40 MG tablet  TAKE ONE-HALF (1/2) TABLET DAILY 45 tablet 2  ? glucose blood (TRUE METRIX BLOOD GLUCOSE TEST) test strip Use as instructed to check blood sugar up to 3 times daily. 100 each 2  ? levothyroxine (SYNTHROID) 50 MCG tablet TAKE 1 TABLET BY MOUTH DAILY 30 tablet 0  ? Lidocaine (HM LIDOCAINE PATCH) 4 % PTCH Apply 1 patch topically every 12 (twelve) hours as needed. 30 patch 0  ? losartan (COZAAR) 25 MG tablet TAKE ONE-HALF (1/2) TABLET DAILY 45 tablet 1  ? metFORMIN (GLUCOPHAGE-XR) 500 MG 24 hr tablet TAKE 2 TABLETS DAILY 180 tablet 0  ? methocarbamol (ROBAXIN) 500 MG  tablet Take 1 tablet (500 mg total) by mouth 2 (two) times daily as needed. 20 tablet 0  ? metoprolol succinate (TOPROL-XL) 25 MG 24 hr tablet TAKE 1 TABLET DAILY 90 tablet 1  ? Multiple Vitamin (MULTIVITAMIN ADULT PO) Take by mouth.    ? oxyCODONE-acetaminophen (PERCOCET) 5-325 MG tablet Take 1 tablet by mouth every 8 (eight) hours as needed for severe pain. 60 tablet 0  ? sertraline (ZOLOFT) 100 MG tablet TAKE 1 TABLET DAILY 90 tablet 1  ? TRUEPLUS LANCETS 28G MISC Use to check blood sugar up to 3 times daily. 100 each 2  ? cycloSPORINE (RESTASIS) 0.05 % ophthalmic emulsion Instill 1 drop in both eyes twice a day 60 each 3  ? diclofenac sodium (VOLTAREN) 1 % GEL Apply 2 g topically 4 (four) times daily. (Patient not taking: Reported on 09/18/2021) 100 g 1  ? fluticasone (FLONASE) 50 MCG/ACT nasal spray PLACE 2 SPRAYS INTO BOTH NOSTRILS DAILY. 16 g 6  ? gabapentin (NEURONTIN) 300 MG capsule Take 1 capsule (300 mg total) by mouth at bedtime. 90 capsule 3  ? ?Current Facility-Administered Medications  ?Medication Dose Route Frequency Provider Last Rate Last Admin  ? 0.9 %  sodium chloride infusion  500 mL Intravenous Once Sharyn Creamer, MD      ? ? ?Allergies as of 10/02/2021 - Review Complete 10/02/2021  ?Allergen Reaction Noted  ? Zithromax [azithromycin dihydrate]  08/18/2011  ? ? ?Family History  ?Problem Relation Age of Onset  ? Hypertension Mother   ? Cancer Mother   ?     colon/cervical  ? Alzheimer's disease Sister   ? Colon cancer Neg Hx   ? Colon polyps Neg Hx   ? Esophageal cancer Neg Hx   ? Stomach cancer Neg Hx   ? Rectal cancer Neg Hx   ? ? ?Social History  ? ?Socioeconomic History  ? Marital status: Married  ?  Spouse name: Not on file  ? Number of children: 2  ? Years of education: Not on file  ? Highest education level: Bachelor's degree (e.g., BA, AB, BS)  ?Occupational History  ? Not on file  ?Tobacco Use  ? Smoking status: Never  ? Smokeless tobacco: Never  ?Vaping Use  ? Vaping Use: Never used   ?Substance and Sexual Activity  ? Alcohol use: No  ? Drug use: No  ? Sexual activity: Not Currently  ?  Birth control/protection: Surgical  ?Other Topics Concern  ? Not on file  ?Social History Narrative  ? Lives with husband who has diabetes.   ? ?Social Determinants of Health  ? ?Financial Resource Strain: Not on file  ?Food Insecurity: Not on file  ?Transportation Needs: Not on file  ?Physical Activity: Not on file  ?Stress: Not on file  ?Social Connections: Not on file  ?Intimate Partner Violence: Not on  file  ? ? ?Physical Exam: ?Vital signs in last 24 hours: ?BP 112/71   Pulse 75   Temp (!) 95.9 ?F (35.5 ?C) (Skin)   Ht '5\' 6"'$  (1.676 m)   Wt 194 lb (88 kg)   SpO2 100%   BMI 31.31 kg/m?  ?GEN: NAD ?EYE: Sclerae anicteric ?ENT: MMM ?CV: Non-tachycardic ?Pulm: No increased work of breathing ?GI: Soft, NT/ND ?NEURO:  Alert & Oriented ? ? ?Christia Reading, MD ?[facility] ? ?10/02/2021 10:59 AM ? ?

## 2021-10-02 NOTE — Progress Notes (Signed)
Pt arrived at [facility] today prepped for a colonoscopy. She was scheduled for an office visit with Dr. Candis Schatz to discuss rescheduling the procedure after pt was seen by Cardiology for a syncopal episode that she had back in March. Pt was originally scheduled for 09/05/21 but had called to ask a question a few days before. At that time it was discovered that pt had sustained a fall and had broken ribs. Further discussion led to finding out that pt had passed out and that was why she fell. At that time MD decided that for safety pt needed to be seen and cleared by Cardiology for the syncopal episode prior to rescheduling the colonoscopy. Reviewed Cardiology note and pt was not found to have a cardiac reason for the syncope. Dr. Candis Schatz made aware. Discussed with Dr. Lorenso Courier who had availability to perform the colonoscopy today and she agreed to do the procedure. Pt and her care partner updated. Procedure scheduled for 11am today.  ?

## 2021-10-02 NOTE — Op Note (Signed)
[facility] ?Patient Name: Heather Bailey ?Procedure Date: 10/02/2021 10:57 AM ?MRN: [id number] ?Endoscopist: Sonny Masters "Christia Reading ,  ?Age: 61 ?Referring MD:  ?Date of Birth: [birth year] ?Gender: Female ?Account #: [id number] ?Procedure:                Colonoscopy ?Indications:              Screening for colorectal malignant neoplasm ?Medicines:                Monitored Anesthesia Care ?Procedure:                Pre-Anesthesia Assessment: ?                          - Prior to the procedure, a History and Physical  ?                          was performed, and patient medications and  ?                          allergies were reviewed. The patient's tolerance of  ?                          previous anesthesia was also reviewed. The risks  ?                          and benefits of the procedure and the sedation  ?                          options and risks were discussed with the patient.  ?                          All questions were answered, and informed consent  ?                          was obtained. Prior Anticoagulants: The patient has  ?                          taken no previous anticoagulant or antiplatelet  ?                          agents. ASA Grade Assessment: II - A patient with  ?                          mild systemic disease. After reviewing the risks  ?                          and benefits, the patient was deemed in  ?                          satisfactory condition to undergo the procedure. ?                          After obtaining informed consent, the colonoscope  ?  was passed under direct vision. Throughout the  ?                          procedure, the patient's blood pressure, pulse, and  ?                          oxygen saturations were monitored continuously. The  ?                          Olympus CF-[id number] (#[id number]) Colonoscope was  ?                          introduced through the anus and advanced to the the  ?                          terminal ileum.  The colonoscopy was performed  ?                          without difficulty. The patient tolerated the  ?                          procedure well. The quality of the bowel  ?                          preparation was good. The terminal ileum, ileocecal  ?                          valve, appendiceal orifice, and rectum were  ?                          photographed. ?Scope In: 22:29:79 AM ?Scope Out: 11:25:07 AM ?Scope Withdrawal Time: 0 hours 12 minutes 59 seconds  ?Total Procedure Duration: 0 hours 18 minutes 54 seconds  ?Findings:                 The terminal ileum appeared normal. ?                          A 5 mm polyp was found in the ascending colon. The  ?                          polyp was sessile. The polyp was removed with a  ?                          cold snare. Resection and retrieval were complete. ?                          Two sessile polyps were found in the sigmoid colon.  ?                          The polyps were 4 to 6 mm in size. These polyps  ?                          were removed with a cold  snare. Resection and  ?                          retrieval were complete. ?                          Non-bleeding internal hemorrhoids were found during  ?                          retroflexion. ?Complications:            No immediate complications. ?Estimated Blood Loss:     Estimated blood loss was minimal. ?Impression:               - The examined portion of the ileum was normal. ?                          - One 5 mm polyp in the ascending colon, removed  ?                          with a cold snare. Resected and retrieved. ?                          - Two 4 to 6 mm polyps in the sigmoid colon,  ?                          removed with a cold snare. Resected and retrieved. ?                          - Non-bleeding internal hemorrhoids. ?Recommendation:           - Discharge patient to home (with escort). ?                          - Await pathology results. ?                          - The findings and  recommendations were discussed  ?                          with the patient. ?Georgian Co,  ?10/02/2021 11:29:15 AM ?

## 2021-10-02 NOTE — Progress Notes (Signed)
Report to PACU, RN, vss, BBS= Clear.  

## 2021-10-02 NOTE — Progress Notes (Signed)
VS by DT  Pt's states no medical or surgical changes since previsit or office visit.  

## 2021-10-02 NOTE — Patient Instructions (Signed)
Handout on polyps and hemorrhoids provided  ? ?Await pathology results.  ? ?Continue current medications.  ? ?YOU HAD AN ENDOSCOPIC PROCEDURE TODAY AT [facility]:   Refer to the procedure report that was given to you for any specific questions about what was found during the examination.  If the procedure report does not answer your questions, please call your gastroenterologist to clarify.  If you requested that your care partner not be given the details of your procedure findings, then the procedure report has been included in a sealed envelope for you to review at your convenience later. ? ?YOU SHOULD EXPECT: Some feelings of bloating in the abdomen. Passage of more gas than usual.  Walking can help get rid of the air that was put into your GI tract during the procedure and reduce the bloating. If you had a lower endoscopy (such as a colonoscopy or flexible sigmoidoscopy) you may notice spotting of blood in your stool or on the toilet paper. If you underwent a bowel prep for your procedure, you may not have a normal bowel movement for a few days. ? ?Please Note:  You might notice some irritation and congestion in your nose or some drainage.  This is from the oxygen used during your procedure.  There is no need for concern and it should clear up in a day or so. ? ?SYMPTOMS TO REPORT IMMEDIATELY: ? ?Following lower endoscopy (colonoscopy or flexible sigmoidoscopy): ? Excessive amounts of blood in the stool ? Significant tenderness or worsening of abdominal pains ? Swelling of the abdomen that is new, acute ? Fever of 100?F or higher ? ?For urgent or emergent issues, a gastroenterologist can be reached at any hour by calling [phone]. ?Do not use MyChart messaging for urgent concerns.  ? ? ?DIET:  We do recommend a small meal at first, but then you may proceed to your regular diet.  Drink plenty of fluids but you should avoid alcoholic beverages for 24 hours. ? ?ACTIVITY:  You should plan to  take it easy for the rest of today and you should NOT DRIVE or use heavy machinery until tomorrow (because of the sedation medicines used during the test).   ? ?FOLLOW UP: ?Our staff will call the number listed on your records 48-72 hours following your procedure to check on you and address any questions or concerns that you may have regarding the information given to you following your procedure. If we do not reach you, we will leave a message.  We will attempt to reach you two times.  During this call, we will ask if you have developed any symptoms of COVID 19. If you develop any symptoms (ie: fever, flu-like symptoms, shortness of breath, cough etc.) before then, please call [phone].  If you test positive for Covid 19 in the 2 weeks post procedure, please call and report this information to [place].   ? ?If any biopsies were taken you will be contacted by phone or by letter within the next 1-3 weeks.  Please call us at [phone] if you have not heard about the biopsies in 3 weeks.  ? ? ?SIGNATURES/CONFIDENTIALITY: ?You and/or your care partner have signed paperwork which will be entered into your electronic medical record.  These signatures attest to the fact that that the information above on your After Visit Summary has been reviewed and is understood.  Full responsibility of the confidentiality of this discharge information lies with you and/or your care-partner. ? ? ? ?

## 2021-10-02 NOTE — Progress Notes (Signed)
Called to room to assist during endoscopic procedure.  Patient ID and intended procedure confirmed with present staff. Received instructions for my participation in the procedure from the performing physician.  

## 2021-10-06 ENCOUNTER — Telehealth: Payer: Self-pay

## 2021-10-06 NOTE — Telephone Encounter (Signed)
?  Follow up Call- ? ? ?  10/02/2021  ? 10:27 AM  ?Call back number  ?Post procedure Call Back phone  # [phone]  ?Permission to leave phone message Yes  ?  ? ?Patient questions: ? ?Do you have a fever, pain , or abdominal swelling? No. ?Pain Score  0 * ? ?Have you tolerated food without any problems? Yes.   ? ?Have you been able to return to your normal activities? Yes.   ? ?Do you have any questions about your discharge instructions: ?Diet   No. ?Medications  No. ?Follow up visit  No. ? ?Do you have questions or concerns about your Care? No. ? ?Actions: ?* If pain score is 4 or above: ?No action needed, pain <4. ? ? ?

## 2021-10-09 ENCOUNTER — Encounter: Payer: Self-pay | Admitting: Internal Medicine

## 2021-10-17 ENCOUNTER — Encounter: Payer: Self-pay | Admitting: Internal Medicine

## 2021-10-17 ENCOUNTER — Ambulatory Visit: Payer: BC Managed Care – PPO | Attending: Internal Medicine | Admitting: Internal Medicine

## 2021-10-17 VITALS — BP 120/80 | HR 77 | Ht 66.0 in | Wt 196.4 lb

## 2021-10-17 DIAGNOSIS — E669 Obesity, unspecified: Secondary | ICD-10-CM

## 2021-10-17 DIAGNOSIS — I10 Essential (primary) hypertension: Secondary | ICD-10-CM

## 2021-10-17 DIAGNOSIS — F320 Major depressive disorder, single episode, mild: Secondary | ICD-10-CM

## 2021-10-17 DIAGNOSIS — E1169 Type 2 diabetes mellitus with other specified complication: Secondary | ICD-10-CM

## 2021-10-17 DIAGNOSIS — Z6831 Body mass index (BMI) 31.0-31.9, adult: Secondary | ICD-10-CM

## 2021-10-17 DIAGNOSIS — D126 Benign neoplasm of colon, unspecified: Secondary | ICD-10-CM

## 2021-10-17 DIAGNOSIS — S2242XD Multiple fractures of ribs, left side, subsequent encounter for fracture with routine healing: Secondary | ICD-10-CM

## 2021-10-17 DIAGNOSIS — E039 Hypothyroidism, unspecified: Secondary | ICD-10-CM

## 2021-10-17 MED ORDER — ACETAMINOPHEN-CODEINE #3 300-30 MG PO TABS: 2.0000 | ORAL_TABLET | Freq: Three times a day (TID) | ORAL | 0 refills | Status: DC | PRN

## 2021-10-17 NOTE — Progress Notes (Signed)
? ? ?Patient ID: Heather Bailey, female    DOB: 1960/08/05  MRN: [id number] ? ?CC: Chronic disease management and follow-up from rib fracture ? ?Subjective: ?Heather Bailey is a 61 y.o. female who presents for chronic disease management ?Her concerns today include:  ?Pt with hx of HTN, DM, hypothyroidism, HL, depression, chronic LBP (on Gabapentin), dry skin.  ?  ? ?Last visit in March was for ER follow-up for rib fractures on the left side that occurred after she passed out at work hitting her left side.  X-rays revealed minimally displaced left lateral 10th and posterior 11th rib fractures.  Patient was placed on oxycodone to use as needed for pain and was taken out of work for 2 months.  She states that her return to work date is set for 11/11/2021.  I had completed some forms for her including FMLA.  However, patient tells me that her Workmen's Comp.and short-term disability were denied.  She was told that the Workmen's Comp. was denied because she has underlying chronic medical illnesses.  She was told short-term disability was denied because it was a work-related injury.  She has retained a lawyer to assist with appealing.  She tells me that the lawyers office had faxed me a letter with a form attach to get information from me regarding her medical condition.  She has a copy of the letter with her which she showed to me today.  I have not received it as yet. ? ?Today she tells me that the pain is not as bad as it was before but she still has a lot of pain.  When the event first happened, she states her pain was 10/10.  Now it is about 7/10 with some days being better than others.  She denies pain when she takes a deep breath.  Pain is most noticeable when she is "moving around too much" and when she gets up from her recliner.  She is out of oxycodone.  She has been taking the muscle relaxer Robaxin. ?-She has history of depression and is on Zoloft.  She feels that her depression was a little worse dealing with her  employer.  She is doing a little better now that she has retained a lawyer to help her appeal ? ?DM: Checks BS but not as frequent.  Taking metformin.  This a.m BS was 85 ?Had eye exam last mth.  [place].  I have not received the results of that as yet. ? ?HTN: takes meds in the evening.  She is on metoprolol 25 mg daily and Cozaar 25 mg daily. ?She limits salt in the foods.  She checks blood pressure intermittently.  She recalls blood pressure reading at the beginning of April was 106/77 beginning April.  By end April was 122/86 ? ?Hypothyroid:  taking the levothyroxine 50 mcg as prescribed.  Last TSH was within normal range. ? ?Had c-scope last month by Dr. Lorenso Courier.  3 polyps were removed 1 of which was a tubular adenoma.  She will be due again in 5 years. ? ?HM:  Reports she had MMG with [facility].  She will try get the report to me so that I can update her health maintenance. ? ?Patient Active Problem List  ? Diagnosis Date Noted  ? Hyperlipidemia associated with type 2 diabetes mellitus ([place]) 11/03/2019  ? Class 1 obesity due to excess calories with serious comorbidity and body mass index (BMI) of 32.0 to 32.9 in adult 11/03/2019  ?  Floaters in visual field, right 11/15/2018  ? Onychomycosis 08/15/2018  ? Achilles tendinitis of right lower extremity 08/15/2018  ? Hot flashes 02/23/2017  ? Chronic low back pain 04/12/2015  ? Dry skin 04/12/2015  ? Chronic dryness of both eyes 08/28/2014  ? DM2 (diabetes mellitus, type 2) ([place]) 03/19/2014  ? Hypothyroidism 03/19/2014  ? Hypertension 03/19/2014  ? Hyperlipidemia 03/19/2014  ? Depression 03/19/2014  ?  ? ?Current Outpatient Medications on File Prior to Visit  ?Medication Sig Dispense Refill  ? atorvastatin (LIPITOR) 40 MG tablet TAKE ONE-HALF (1/2) TABLET DAILY 45 tablet 2  ? cycloSPORINE (RESTASIS) 0.05 % ophthalmic emulsion Instill 1 drop in both eyes twice a day 60 each 3  ? diclofenac sodium (VOLTAREN) 1 % GEL Apply 2 g topically 4 (four) times daily.  100 g 1  ? fluticasone (FLONASE) 50 MCG/ACT nasal spray PLACE 2 SPRAYS INTO BOTH NOSTRILS DAILY. 16 g 6  ? gabapentin (NEURONTIN) 300 MG capsule Take 1 capsule (300 mg total) by mouth at bedtime. 90 capsule 3  ? glucose blood (TRUE METRIX BLOOD GLUCOSE TEST) test strip Use as instructed to check blood sugar up to 3 times daily. 100 each 2  ? levothyroxine (SYNTHROID) 50 MCG tablet TAKE 1 TABLET BY MOUTH DAILY 30 tablet 0  ? Lidocaine (HM LIDOCAINE PATCH) 4 % PTCH Apply 1 patch topically every 12 (twelve) hours as needed. 30 patch 0  ? losartan (COZAAR) 25 MG tablet TAKE ONE-HALF (1/2) TABLET DAILY 45 tablet 1  ? metFORMIN (GLUCOPHAGE-XR) 500 MG 24 hr tablet TAKE 2 TABLETS DAILY 180 tablet 0  ? methocarbamol (ROBAXIN) 500 MG tablet Take 1 tablet (500 mg total) by mouth 2 (two) times daily as needed. 20 tablet 0  ? metoprolol succinate (TOPROL-XL) 25 MG 24 hr tablet TAKE 1 TABLET DAILY 90 tablet 1  ? Multiple Vitamin (MULTIVITAMIN ADULT PO) Take by mouth.    ? sertraline (ZOLOFT) 100 MG tablet TAKE 1 TABLET DAILY 90 tablet 1  ? TRUEPLUS LANCETS 28G MISC Use to check blood sugar up to 3 times daily. 100 each 2  ? ?No current facility-administered medications on file prior to visit.  ? ? ?Allergies  ?Allergen Reactions  ? Zithromax [Azithromycin Dihydrate]   ? ? ?Social History  ? ?Socioeconomic History  ? Marital status: Married  ?  Spouse name: Not on file  ? Number of children: 2  ? Years of education: Not on file  ? Highest education level: Bachelor's degree (e.g., BA, AB, BS)  ?Occupational History  ? Not on file  ?Tobacco Use  ? Smoking status: Never  ? Smokeless tobacco: Never  ?Vaping Use  ? Vaping Use: Never used  ?Substance and Sexual Activity  ? Alcohol use: No  ? Drug use: No  ? Sexual activity: Not Currently  ?  Birth control/protection: Surgical  ?Other Topics Concern  ? Not on file  ?Social History Narrative  ? Lives with husband who has diabetes.   ? ?Social Determinants of Health  ? ?Financial Resource  Strain: Not on file  ?Food Insecurity: Not on file  ?Transportation Needs: Not on file  ?Physical Activity: Not on file  ?Stress: Not on file  ?Social Connections: Not on file  ?Intimate Partner Violence: Not on file  ? ? ?Family History  ?Problem Relation Age of Onset  ? Hypertension Mother   ? Cancer Mother   ?     colon/cervical  ? Alzheimer's disease Sister   ? Colon cancer Neg  Hx   ? Colon polyps Neg Hx   ? Esophageal cancer Neg Hx   ? Stomach cancer Neg Hx   ? Rectal cancer Neg Hx   ? ? ?Past Surgical History:  ?Procedure Laterality Date  ? CESAREAN SECTION    ? MYOMECTOMY    ? TUBAL LIGATION    ? ? ?ROS: ?Review of Systems ?Negative except as stated above ? ?PHYSICAL EXAM: ?BP 120/80   Pulse 77   Ht '5\' 6"'$  (1.676 m)   Wt 196 lb 6.4 oz (89.1 kg)   SpO2 99%   BMI 31.70 kg/m?   ?Physical Exam ? ?General appearance - alert, well appearing, and in no distress ?Mental status - normal mood, behavior, speech, dress, motor activity, and thought processes ?Neck - supple, no significant adenopathy ?Chest - clear to auscultation, no wheezes, rales or rhonchi, symmetric air entry.  No splinting noted. ?Heart - normal rate, regular rhythm, normal S1, S2, no murmurs, rubs, clicks or gallops ?Musculoskeletal -mild tenderness on palpation of the left posterior lateral rib cage at about the 10th rib. ?Extremities - peripheral pulses normal, no pedal edema, no clubbing or cyanosis ? ? ?  10/17/2021  ?  3:11 PM 05/05/2021  ? 10:23 AM 11/06/2020  ?  8:57 AM  ?Depression screen PHQ 2/9  ?Decreased Interest 2 1 0  ?Down, Depressed, Hopeless 2 0 0  ?PHQ - 2 Score 4 1 0  ?Altered sleeping 2    ?Tired, decreased energy 2    ?Change in appetite 2    ?Feeling bad or failure about yourself  2    ?Trouble concentrating 2    ?Moving slowly or fidgety/restless 1    ?Suicidal thoughts 0    ?PHQ-9 Score 15    ? ? ? ?  Latest Ref Rng & Units 09/03/2021  ?  1:29 PM 11/06/2020  ?  9:24 AM 09/05/2020  ? 10:45 AM  ?CMP  ?Glucose 70 - 99 mg/dL 165    89   86    ?BUN 6 - 20 mg/dL '15   18   14    '$ ?Creatinine 0.44 - 1.00 mg/dL 1.17   1.17   1.06    ?Sodium 135 - 145 mmol/L 138   139   139    ?Potassium 3.5 - 5.1 mmol/L 3.7   4.4   4.5    ?Chloride 98 - 111 mm

## 2021-10-18 LAB — LIPID PANEL
Chol/HDL Ratio: 3.8 ratio (ref 0.0–4.4)
Cholesterol, Total: 187 mg/dL (ref 100–199)
HDL: 49 mg/dL (ref >39)
LDL Chol Calc (NIH): 118 mg/dL — ABNORMAL HIGH (ref 0–99)
Triglycerides: 109 mg/dL (ref 0–149)
VLDL Cholesterol Cal: 20 mg/dL (ref 5–40)

## 2021-10-18 LAB — HEMOGLOBIN A1C
Est. average glucose Bld gHb Est-mCnc: 128 mg/dL
Hgb A1c MFr Bld: 6.1 % — ABNORMAL HIGH (ref 4.8–5.6)

## 2021-10-18 LAB — MICROALBUMIN / CREATININE URINE RATIO
Creatinine, Urine: 176.4 mg/dL
Microalb/Creat Ratio: 3 mg/g creat (ref 0–29)
Microalbumin, Urine: 4.8 ug/mL

## 2021-11-03 ENCOUNTER — Encounter: Payer: Self-pay | Admitting: Internal Medicine

## 2021-11-03 ENCOUNTER — Other Ambulatory Visit: Payer: Self-pay | Admitting: Internal Medicine

## 2021-11-03 DIAGNOSIS — E1169 Type 2 diabetes mellitus with other specified complication: Secondary | ICD-10-CM

## 2021-11-03 DIAGNOSIS — E669 Obesity, unspecified: Secondary | ICD-10-CM

## 2021-11-03 MED ORDER — ATORVASTATIN CALCIUM 40 MG PO TABS: 40.0000 mg | ORAL_TABLET | Freq: Every day | ORAL | 2 refills | Status: DC

## 2021-11-10 ENCOUNTER — Encounter: Payer: Self-pay | Admitting: Emergency Medicine

## 2021-11-10 ENCOUNTER — Ambulatory Visit
Admission: EM | Admit: 2021-11-10 | Discharge: 2021-11-10 | Disposition: A | Discharge status: Home / Self Care | Payer: BC Managed Care – PPO | Attending: Internal Medicine | Admitting: Internal Medicine

## 2021-11-10 DIAGNOSIS — J069 Acute upper respiratory infection, unspecified: Secondary | ICD-10-CM | POA: Diagnosis not present

## 2021-11-10 DIAGNOSIS — H65192 Other acute nonsuppurative otitis media, left ear: Secondary | ICD-10-CM | POA: Diagnosis not present

## 2021-11-10 MED ORDER — AMOXICILLIN 875 MG PO TABS: 875.0000 mg | ORAL_TABLET | Freq: Two times a day (BID) | ORAL | 0 refills | Status: AC

## 2021-11-10 NOTE — ED Provider Notes (Signed)
[facility]    CSN: [id number] Arrival date & time: 11/10/21  1535      History   Chief Complaint Chief Complaint  Patient presents with   Possible Sinus Infection    HPI Heather Bailey is a 61 y.o. female.   Patient presents with nasal congestion, left ear pain, nasal drainage, headache, sore throat that has been present for approximately 3 to 4 days.  Denies any known sick contacts or fevers.  Patient has taken Tylenol and approximately 4 pills of amoxicillin antibiotic that she had leftover.  She reports improvement with these medications.  Denies chest pain, shortness of breath, cough, nausea, vomiting, diarrhea, abdominal pain.    Past Medical History:  Diagnosis Date   Anemia Dx 2003   Arthritis Dx 1999   DVT (deep venous thrombosis) ([place])    Fibroids    Gestational diabetes    Hyperlipidemia Dx 2000   Hypertension Dx 2000   Hypertensive retinopathy    Thyroid disease Dx 2005    Patient Active Problem List   Diagnosis Date Noted   Hyperlipidemia associated with type 2 diabetes mellitus ([place]) 11/03/2019   Class 1 obesity due to excess calories with serious comorbidity and body mass index (BMI) of 32.0 to 32.9 in adult 11/03/2019   Floaters in visual field, right 11/15/2018   Onychomycosis 08/15/2018   Achilles tendinitis of right lower extremity 08/15/2018   Hot flashes 02/23/2017   Chronic low back pain 04/12/2015   Dry skin 04/12/2015   Chronic dryness of both eyes 08/28/2014   DM2 (diabetes mellitus, type 2) ([place]) 03/19/2014   Hypothyroidism 03/19/2014   Hypertension 03/19/2014   Hyperlipidemia 03/19/2014   Depression 03/19/2014    Past Surgical History:  Procedure Laterality Date   CESAREAN SECTION     MYOMECTOMY     TUBAL LIGATION      OB History     Gravida  2   Para  2   Term  2   Preterm      AB      Living  2      SAB      IAB      Ectopic      Multiple      Live Births               Home  Medications    Prior to Admission medications   Medication Sig Start Date End Date Taking? Authorizing Provider  acetaminophen-codeine (TYLENOL #3) 300-30 MG tablet Take 2 tablets by mouth every 8 (eight) hours as needed for moderate pain. 10/17/21  Yes Ladell Pier, MD  amoxicillin (AMOXIL) 875 MG tablet Take 1 tablet (875 mg total) by mouth 2 (two) times daily for 7 days. 11/10/21 11/17/21 Yes Tate Jerkins, Michele Rockers, FNP  atorvastatin (LIPITOR) 40 MG tablet Take 1 tablet (40 mg total) by mouth daily. 11/03/21  Yes Ladell Pier, MD  cycloSPORINE (RESTASIS) 0.05 % ophthalmic emulsion Instill 1 drop in both eyes twice a day 10/31/20  Yes   diclofenac sodium (VOLTAREN) 1 % GEL Apply 2 g topically 4 (four) times daily. 03/03/19  Yes Fulp, Cammie, MD  gabapentin (NEURONTIN) 300 MG capsule Take 1 capsule (300 mg total) by mouth at bedtime. 11/06/20  Yes Freeman Caldron M, PA-C  glucose blood (TRUE METRIX BLOOD GLUCOSE TEST) test strip Use as instructed to check blood sugar up to 3 times daily. 08/17/18  Yes Ladell Pier, MD  levothyroxine (SYNTHROID) 50 MCG  tablet TAKE 1 TABLET BY MOUTH DAILY 05/20/21  Yes Ladell Pier, MD  Lidocaine (HM LIDOCAINE PATCH) 4 % PTCH Apply 1 patch topically every 12 (twelve) hours as needed. 09/03/21  Yes Loni Beckwith, PA-C  losartan (COZAAR) 25 MG tablet TAKE ONE-HALF (1/2) TABLET DAILY 09/26/21  Yes Ladell Pier, MD  metFORMIN (GLUCOPHAGE-XR) 500 MG 24 hr tablet TAKE 2 TABLETS DAILY 09/26/21  Yes Ladell Pier, MD  methocarbamol (ROBAXIN) 500 MG tablet Take 1 tablet (500 mg total) by mouth 2 (two) times daily as needed. 03/27/21  Yes Aundra Dubin, PA-C  metoprolol succinate (TOPROL-XL) 25 MG 24 hr tablet TAKE 1 TABLET DAILY 09/26/21  Yes Ladell Pier, MD  Multiple Vitamin (MULTIVITAMIN ADULT PO) Take by mouth.   Yes [provider]  sertraline (ZOLOFT) 100 MG tablet TAKE 1 TABLET DAILY 06/30/21  Yes Ladell Pier, MD  TRUEPLUS  LANCETS 28G MISC Use to check blood sugar up to 3 times daily. 08/17/18  Yes Ladell Pier, MD  fluticasone (FLONASE) 50 MCG/ACT nasal spray PLACE 2 SPRAYS INTO BOTH NOSTRILS DAILY. 11/06/20 11/06/21  Argentina Donovan, PA-C    Family History Family History  Problem Relation Age of Onset   Hypertension Mother    Cancer Mother        colon/cervical   Alzheimer's disease Sister    Colon cancer Neg Hx    Colon polyps Neg Hx    Esophageal cancer Neg Hx    Stomach cancer Neg Hx    Rectal cancer Neg Hx     Social History Social History   Tobacco Use   Smoking status: Never   Smokeless tobacco: Never  Vaping Use   Vaping Use: Never used  Substance Use Topics   Alcohol use: No   Drug use: No     Allergies   Zithromax [azithromycin dihydrate]   Review of Systems Review of Systems Per HPI  Physical Exam Triage Vital Signs ED Triage Vitals  Enc Vitals Group     BP 11/10/21 1651 (!) 147/89     Pulse Rate 11/10/21 1651 76     Resp 11/10/21 1651 20     Temp 11/10/21 1651 97.9 F (36.6 C)     Temp Source 11/10/21 1651 Oral     SpO2 11/10/21 1651 95 %     Weight 11/10/21 1653 196 lb 6.9 oz (89.1 kg)     Height 11/10/21 1653 '5\' 6"'$  (1.676 m)     Head Circumference --      Peak Flow --      Pain Score 11/10/21 1653 4     Pain Loc --      Pain Edu? --      Excl. in [place]? --    No data found.  Updated Vital Signs BP (!) 147/89 (BP Location: Right Arm)   Pulse 76   Temp 97.9 F (36.6 C) (Oral)   Resp 20   Ht '5\' 6"'$  (1.676 m)   Wt 196 lb 6.9 oz (89.1 kg)   SpO2 95%   BMI 31.70 kg/m   Visual Acuity Right Eye Distance:   Left Eye Distance:   Bilateral Distance:    Right Eye Near:   Left Eye Near:    Bilateral Near:     Physical Exam Constitutional:      General: She is not in acute distress.    Appearance: Normal appearance. She is not toxic-appearing or diaphoretic.  HENT:  Head: Normocephalic and atraumatic.     Right Ear: Tympanic membrane and ear  canal normal.     Left Ear: Ear canal normal. Tympanic membrane is erythematous. Tympanic membrane is not perforated or bulging.     Ears:     Comments: Slightly erythematous left tympanic membrane.    Nose: Congestion present.     Mouth/Throat:     Mouth: Mucous membranes are moist.     Pharynx: No posterior oropharyngeal erythema.  Eyes:     Extraocular Movements: Extraocular movements intact.     Conjunctiva/sclera: Conjunctivae normal.     Pupils: Pupils are equal, round, and reactive to light.  Cardiovascular:     Rate and Rhythm: Normal rate and regular rhythm.     Pulses: Normal pulses.     Heart sounds: Normal heart sounds.  Pulmonary:     Effort: Pulmonary effort is normal. No respiratory distress.     Breath sounds: Normal breath sounds. No wheezing.  Abdominal:     General: Abdomen is flat. Bowel sounds are normal.     Palpations: Abdomen is soft.  Musculoskeletal:        General: Normal range of motion.     Cervical back: Normal range of motion.  Skin:    General: Skin is warm and dry.  Neurological:     General: No focal deficit present.     Mental Status: She is alert and oriented to person, place, and time. Mental status is at baseline.  Psychiatric:        Mood and Affect: Mood normal.        Behavior: Behavior normal.     UC Treatments / Results  Labs (all labs ordered are listed, but only abnormal results are displayed) Labs Reviewed  NOVEL CORONAVIRUS, NAA    EKG   Radiology No results found.  Procedures Procedures (including critical care time)  Medications Ordered in UC Medications - No data to display  Initial Impression / Assessment and Plan / UC Course  I have reviewed the triage vital signs and the nursing notes.  Pertinent labs & imaging results that were available during my care of the patient were reviewed by me and considered in my medical decision making (see chart for details).     Patient presents with symptoms likely from  a viral upper respiratory infection. Differential includes bacterial pneumonia, sinusitis, allergic rhinitis, COVID-19, flu. Do not suspect underlying cardiopulmonary process. Symptoms seem unlikely related to ACS, CHF or COPD exacerbations, pneumonia, pneumothorax. Patient is nontoxic appearing and not in need of emergent medical intervention.  No suspicion for strep throat so will defer testing.  COVID test pending.  Recommended symptom control with over the counter medications.  Amoxicillin to treat upper respiratory infection as well as left otitis media.  Return if symptoms fail to improve in 1-2 weeks or you develop shortness of breath, chest pain, severe headache. Patient states understanding and is agreeable.  Discharged with PCP followup.  Final Clinical Impressions(s) / UC Diagnoses   Final diagnoses:  Other non-recurrent acute nonsuppurative otitis media of left ear  Viral upper respiratory tract infection with cough     Discharge Instructions      You have an ear infection which is being treated with an antibiotic.  It also appears that you have a viral upper respiratory infection that is causing symptoms.  COVID test is pending.  We will call if it is positive.    ED Prescriptions     Medication  Sig Dispense Auth. Provider   amoxicillin (AMOXIL) 875 MG tablet Take 1 tablet (875 mg total) by mouth 2 (two) times daily for 7 days. 14 tablet [place], Michele Rockers, [place]      PDMP not reviewed this encounter.   Teodora Medici, [place] 11/10/21 [phone]

## 2021-11-10 NOTE — ED Triage Notes (Signed)
Patient c/o possible sinus infection, left ear pain, nasal drainage, headache x 3-4 days.  Patient has taken Tylenol, Amoxil.

## 2021-11-10 NOTE — Discharge Instructions (Signed)
You have an ear infection which is being treated with an antibiotic.  It also appears that you have a viral upper respiratory infection that is causing symptoms.  COVID test is pending.  We will call if it is positive.

## 2021-11-12 LAB — NOVEL CORONAVIRUS, NAA: SARS-CoV-2, NAA: NOT DETECTED

## 2021-11-20 DIAGNOSIS — Z6831 Body mass index (BMI) 31.0-31.9, adult: Secondary | ICD-10-CM | POA: Diagnosis not present

## 2021-11-20 DIAGNOSIS — N83209 Unspecified ovarian cyst, unspecified side: Secondary | ICD-10-CM | POA: Diagnosis not present

## 2021-11-20 DIAGNOSIS — D259 Leiomyoma of uterus, unspecified: Secondary | ICD-10-CM | POA: Diagnosis not present

## 2021-11-26 ENCOUNTER — Telehealth: Payer: Self-pay | Admitting: *Deleted

## 2021-11-26 NOTE — Telephone Encounter (Signed)
Spoke with the patient and scheduled a new patient with Dr Berline Lopes on 6/30 at 9 am. Patient given an arrival time of *8:30 am. Patient also given the address and phone number for the clinic; along with the policy for visitors.

## 2021-12-10 ENCOUNTER — Encounter: Payer: Self-pay | Admitting: Gynecologic Oncology

## 2021-12-11 NOTE — Progress Notes (Unsigned)
GYNECOLOGIC ONCOLOGY NEW PATIENT CONSULTATION   Patient Name: Heather Bailey  Patient Age: 61 y.o. Date of Service: 12/12/21 Referring Provider: Dr. Irene Pap  Primary Care Provider: Ladell Pier, MD Consulting Provider: Jeral Pinch, MD   Assessment/Plan:  Postmenopausal patient with history of bilateral ovarian cysts with recent change noted on ultrasound, mild elevation of CA-125.  I discussed with the patient and her husband findings on recent ultrasound that show complexity of one of the small cystic lesions. She has bilateral small ovarian cysts that have been followed intermittently (due to insurance) for at least several years per the patients report. Since I am unable to see the pictures, we discussed the difficulty in assessing how concerning/complex the adnexal mass now appears. Additionally, from what the patient remembers, she has never had tumor markers drawn. We discussed that CA-125 is not approved for diagnostic purposes; it can be normal in up to 50% of early stage ovarian cancers and elevated in many non cancerous disease processes (like fibroids or endometriosis).   My office will reach out to see if we can get any further details about the patient's surgical history. She has undergone multiple myomectomy procedures but does not remember if these were transvaginal or abdominal. My suspicion is that at least one of these procedures was performed abdominally given receipt of Lupron preoperatively. Abdominal myomectomy procedures would place the patient at having significant adhesions. She also endorses a history of endometriosis, which we discussed can lead to distortion of normal anatomy.   I think it would be reasonable to move forward with scheduling surgery (to include USO vs. BSO at minimum). I also offered to the patient, given some hesitation about surgery, that we could repeat an ultrasound. If ultrasound findings raise concern for possible malignancy, we  could proceed with additional imaging (pelvic MRI) versus scheduling surgery. After discussing risks and benefits of surgery, the patient would like to proceed with repeating a pelvic ultrasound here at the hospital. I will call her to discuss findings after ultrasound performed.   If the patient undergoes surgery, given her history of DVT, would consider 2 weeks of prophylactic anticoagulation after surgery.   A copy of this note was sent to the patient's referring provider.   60 minutes of total time was spent for this patient encounter, including preparation, face-to-face counseling with the patient and coordination of care, and documentation of the encounter.   Jeral Pinch, MD  Division of Gynecologic Oncology  Department of Obstetrics and Gynecology  [facility]  ___________________________________________  Chief Complaint: Chief Complaint  Patient presents with   Cyst of ovary, unspecified laterality    History of Present Illness:  Heather Bailey is a 61 y.o. y.o. female who is seen in consultation at the request of Dr. Brien Mates for an evaluation of a complex adnexal mass.  The patient reports being followed for at least the last 3-4 years for cyst on her ovaries.  At the time that this was initially found, she did not have insurance.  Now she has insurance and is following with her OB/GYN regularly.  Pelvic ultrasound exam performed on 04/30/2021 at [facility] showed a uterus measuring 6.3 x 6.3 x 4.1 cm with an endometrial lining of 7.3 mm.  Left ovary measures up to 5.7 cm in size and right up to 5.2 cm in size.  Multiple cysts seen in bilateral ovaries, largest on the right measures up to 3.2 cm and on the left measures  up to 3.5 cm.  Tumor markers were performed on 11/20/2021.  CEA was 1.9, CA 19-9 was <2 and CA-125 was mildly elevated at 54.9. Pelvic ultrasound exam at [facility] on 11/20/2021 showed a uterus measuring 5.8 x 6.8 x  4.3 cm with an endometrial lining of 3.1 mm.  Left ovary measures 5.3 x 4.3 x 4 cm.  Right ovary measures 4.4 x 3.3 x 2.6 cm.  There are multiple simple cyst seen bilaterally, the 3 largest on the right measuring up to 2.8 cm, along with a small projection within the cyst.  Left also with multiple simple cysts, the largest measuring up to 2.8 cm.  Multiple fibroids noted within the uterus, largest measuring up to 2.9 cm.  Small amount of free fluid noted within the cul-de-sac.  Patient endorses some chronic back pain, unsure if this is related to her weight.  Also notes history of constant pelvic pressure, denies any pain.  This pressure does not require the use of medications.  She endorses normal bowel and bladder function.  Denies any recent weight changes.  Endorses a good appetite without nausea or emesis.  She denies any vaginal bleeding or discharge.  She has a history of a DVT which developed after receiving Lupron prior to myomectomy surgery.  Per her report, no other cause for DVT was identified.  She was placed on anticoagulation for 6 months.  Family history is notable for stomach cancer in her mother, treated in [place].  Cancer spread to other organs including the cervix and colon.  PAST MEDICAL HISTORY:  Past Medical History:  Diagnosis Date   Anemia Dx 2003   Arthritis Dx 1999   DVT (deep venous thrombosis) ([place]) 2007   got Lupron to shrink fibroids, developed DVT after (blood thinner for 6 months)   Fibroids    Gestational diabetes    Hyperlipidemia Dx 2000   Hypertension Dx 2000   Hypertensive retinopathy    Thyroid disease Dx 2005     PAST SURGICAL HISTORY:  Past Surgical History:  Procedure Laterality Date   CESAREAN SECTION     MYOMECTOMY     x2-3   TUBAL LIGATION     at c-section    OB/GYN HISTORY:  OB History  Gravida Para Term Preterm AB Living  '2 2 2     2  '$ SAB IAB Ectopic Multiple Live Births               # Outcome Date GA Lbr  Len/2nd Weight Sex Delivery Anes PTL Lv  2 Term           1 Term             No LMP recorded. Patient is postmenopausal.  Age at menarche: 64 Age at menopause: The 8 Hx of HRT: Denies Hx of STDs: yes - history of genital warts Last pap: 2021 - negative, HR HPV negative History of abnormal pap smears: no  SCREENING STUDIES:  Last mammogram: 2021  Last colonoscopy: 09/2021  MEDICATIONS: Outpatient Encounter Medications as of 12/12/2021  Medication Sig   acetaminophen-codeine (TYLENOL #3) 300-30 MG tablet Take 2 tablets by mouth every 8 (eight) hours as needed for moderate pain.   atorvastatin (LIPITOR) 40 MG tablet Take 1 tablet (40 mg total) by mouth daily.   cycloSPORINE (RESTASIS) 0.05 % ophthalmic emulsion Instill 1 drop in both eyes twice a day   diclofenac sodium (VOLTAREN) 1 % GEL Apply 2 g topically 4 (four)  times daily.   gabapentin (NEURONTIN) 300 MG capsule Take 1 capsule (300 mg total) by mouth at bedtime.   glucose blood (TRUE METRIX BLOOD GLUCOSE TEST) test strip Use as instructed to check blood sugar up to 3 times daily.   levothyroxine (SYNTHROID) 50 MCG tablet TAKE 1 TABLET BY MOUTH DAILY   Lidocaine (HM LIDOCAINE PATCH) 4 % PTCH Apply 1 patch topically every 12 (twelve) hours as needed.   losartan (COZAAR) 25 MG tablet TAKE ONE-HALF (1/2) TABLET DAILY   metFORMIN (GLUCOPHAGE-XR) 500 MG 24 hr tablet TAKE 2 TABLETS DAILY   methocarbamol (ROBAXIN) 500 MG tablet Take 1 tablet (500 mg total) by mouth 2 (two) times daily as needed.   metoprolol succinate (TOPROL-XL) 25 MG 24 hr tablet TAKE 1 TABLET DAILY   Multiple Vitamin (MULTIVITAMIN ADULT PO) Take by mouth.   sertraline (ZOLOFT) 100 MG tablet TAKE 1 TABLET DAILY   TRUEPLUS LANCETS 28G MISC Use to check blood sugar up to 3 times daily.   [DISCONTINUED] fluticasone (FLONASE) 50 MCG/ACT nasal spray PLACE 2 SPRAYS INTO BOTH NOSTRILS DAILY.   No facility-administered encounter medications on file as of 12/12/2021.     ALLERGIES:  Allergies  Allergen Reactions   Zithromax [Azithromycin Dihydrate]      FAMILY HISTORY:  Family History  Problem Relation Age of Onset   Hypertension Mother    Stomach cancer Mother    Alzheimer's disease Sister    Colon cancer Neg Hx    Colon polyps Neg Hx    Esophageal cancer Neg Hx    Rectal cancer Neg Hx      SOCIAL HISTORY:  Social Connections: Unknown (08/03/2017)   Social Connection and Isolation Panel [NHANES]    Frequency of Communication with Friends and Family: Patient refused    Frequency of Social Gatherings with Friends and Family: Patient refused    Attends Religious Services: Patient refused    Marine scientist or Organizations: Patient refused    Attends Archivist Meetings: Patient refused    Marital Status: Patient refused    REVIEW OF SYSTEMS:  + Fatigue, back pain, joint pain, still healing from broken left ribs Denies appetite changes, fevers, chills, unexplained weight changes. Denies hearing loss, neck lumps or masses, mouth sores, ringing in ears or voice changes. Denies cough or wheezing.  Denies shortness of breath. Denies chest pain or palpitations. Denies leg swelling. Denies abdominal distention, pain, blood in stools, constipation, diarrhea, nausea, vomiting, or early satiety. Denies pain with intercourse, dysuria, frequency, hematuria or incontinence. Denies hot flashes, pelvic pain, vaginal bleeding or vaginal discharge.   Denies muscle pain/cramps. Denies itching, rash, or wounds. Denies dizziness, headaches, numbness or seizures. Denies swollen lymph nodes or glands, denies easy bruising or bleeding. Denies anxiety, depression, confusion, or decreased concentration.  Physical Exam:  Vital Signs for this encounter:  Blood pressure 126/85, pulse 77, temperature 98.7 F (37.1 C), temperature source Oral, resp. rate 16, height 5' 5.5" (1.664 m), weight 191 lb 6.4 oz (86.8 kg), SpO2 99 %. Body mass index is  31.37 kg/m. General: Alert, oriented, no acute distress.  HEENT: Normocephalic, atraumatic. Sclera anicteric.  Chest: Clear to auscultation bilaterally. No wheezes, rhonchi, or rales. Cardiovascular: Regular rate and rhythm, no murmurs, rubs, or gallops.  Abdomen: Normoactive bowel sounds. Soft, nondistended, nontender to palpation. No masses or hepatosplenomegaly appreciated. No palpable fluid wave.  Well-healed Pfannenstiel incision. Extremities: Grossly normal range of motion. Warm, well perfused. No edema bilaterally.  Skin: No rashes  or lesions.  Lymphatics: No cervical, supraclavicular, or inguinal adenopathy.  GU:  Normal external female genitalia. No lesions. No discharge or bleeding.             Bladder/urethra:  No lesions or masses, well supported bladder             Vagina: No lesions, no bladder discharge within the vault.             Cervix: Normal appearing, no lesions.  Ectropion noted.             Uterus: Small, moderately mobile, no parametrial involvement or nodularity.             Adnexa: Fullness appreciated in the cul-de-sac, somewhat mobile, smooth.  Rectal: Deferred.  LABORATORY AND RADIOLOGIC DATA:  Outside medical records were reviewed to synthesize the above history, along with the history and physical obtained during the visit.   Lab Results  Component Value Date   WBC 5.9 09/03/2021   HGB 12.5 09/03/2021   HCT 39.2 09/03/2021   PLT 268 09/03/2021   GLUCOSE 165 (H) 09/03/2021   CHOL 187 10/17/2021   TRIG 109 10/17/2021   HDL 49 10/17/2021   LDLCALC 118 (H) 10/17/2021   ALT 21 09/03/2021   AST 22 09/03/2021   NA 138 09/03/2021   K 3.7 09/03/2021   CL 104 09/03/2021   CREATININE 1.17 (H) 09/03/2021   BUN 15 09/03/2021   CO2 27 09/03/2021   TSH 3.330 05/05/2021   HGBA1C 6.1 (H) 10/17/2021   MICROALBUR 0.2 08/21/2016

## 2021-12-12 ENCOUNTER — Encounter: Payer: Self-pay | Admitting: Gynecologic Oncology

## 2021-12-12 ENCOUNTER — Other Ambulatory Visit: Payer: Self-pay

## 2021-12-12 ENCOUNTER — Inpatient Hospital Stay: Payer: BC Managed Care – PPO | Attending: Gynecologic Oncology | Admitting: Gynecologic Oncology

## 2021-12-12 VITALS — BP 126/85 | HR 77 | Temp 98.7°F | Resp 16 | Ht 65.5 in | Wt 191.4 lb

## 2021-12-12 DIAGNOSIS — Z800 Family history of malignant neoplasm of digestive organs: Secondary | ICD-10-CM | POA: Insufficient documentation

## 2021-12-12 DIAGNOSIS — M549 Dorsalgia, unspecified: Secondary | ICD-10-CM | POA: Diagnosis not present

## 2021-12-12 DIAGNOSIS — Z7984 Long term (current) use of oral hypoglycemic drugs: Secondary | ICD-10-CM | POA: Diagnosis not present

## 2021-12-12 DIAGNOSIS — Z780 Asymptomatic menopausal state: Secondary | ICD-10-CM | POA: Insufficient documentation

## 2021-12-12 DIAGNOSIS — M1990 Unspecified osteoarthritis, unspecified site: Secondary | ICD-10-CM | POA: Insufficient documentation

## 2021-12-12 DIAGNOSIS — E785 Hyperlipidemia, unspecified: Secondary | ICD-10-CM | POA: Diagnosis not present

## 2021-12-12 DIAGNOSIS — Z79890 Hormone replacement therapy (postmenopausal): Secondary | ICD-10-CM | POA: Diagnosis not present

## 2021-12-12 DIAGNOSIS — Z86718 Personal history of other venous thrombosis and embolism: Secondary | ICD-10-CM

## 2021-12-12 DIAGNOSIS — N83201 Unspecified ovarian cyst, right side: Secondary | ICD-10-CM

## 2021-12-12 DIAGNOSIS — R971 Elevated cancer antigen 125 [CA 125]: Secondary | ICD-10-CM | POA: Diagnosis not present

## 2021-12-12 DIAGNOSIS — Z6831 Body mass index (BMI) 31.0-31.9, adult: Secondary | ICD-10-CM

## 2021-12-12 DIAGNOSIS — G8929 Other chronic pain: Secondary | ICD-10-CM | POA: Diagnosis not present

## 2021-12-12 DIAGNOSIS — D259 Leiomyoma of uterus, unspecified: Secondary | ICD-10-CM

## 2021-12-12 DIAGNOSIS — E119 Type 2 diabetes mellitus without complications: Secondary | ICD-10-CM | POA: Insufficient documentation

## 2021-12-12 DIAGNOSIS — N83209 Unspecified ovarian cyst, unspecified side: Secondary | ICD-10-CM

## 2021-12-12 DIAGNOSIS — H35039 Hypertensive retinopathy, unspecified eye: Secondary | ICD-10-CM | POA: Insufficient documentation

## 2021-12-12 DIAGNOSIS — Z79899 Other long term (current) drug therapy: Secondary | ICD-10-CM | POA: Diagnosis not present

## 2021-12-12 DIAGNOSIS — Z9889 Other specified postprocedural states: Secondary | ICD-10-CM

## 2021-12-12 DIAGNOSIS — N83202 Unspecified ovarian cyst, left side: Secondary | ICD-10-CM | POA: Diagnosis not present

## 2021-12-12 DIAGNOSIS — I10 Essential (primary) hypertension: Secondary | ICD-10-CM | POA: Insufficient documentation

## 2021-12-12 DIAGNOSIS — Z8742 Personal history of other diseases of the female genital tract: Secondary | ICD-10-CM

## 2021-12-12 DIAGNOSIS — E079 Disorder of thyroid, unspecified: Secondary | ICD-10-CM | POA: Insufficient documentation

## 2021-12-12 NOTE — Patient Instructions (Signed)
It was nice to meet you today.  Today, we discussed that you have had multiple cysts on both ovaries that have been followed on ultrasound with your OB/GYN.  On your recent ultrasound, there was a small change to one of the ovaries.  Given this, there was some concern that this could potentially represent a cancer.  You had several blood tests, 2 of which were normal, and 1 which was slightly abnormal. That test, which is called CA-125, can be a marker for ovarian cancer.  But it is not a marker that we can use to diagnose cancer before surgery.  There are many other noncancerous diseases, such as having uterine fibroids or endometriosis, that can cause it to be elevated.  We talked about proceeding with scheduling surgery based on the information we have or getting some additional information with an ultrasound.  We are planning to get an ultrasound here at the hospital to look at both ovaries and cysts again to help [place] make a decision about whether surgery is needed or not.  I will call you once I have seen the ultrasound pictures.  Please not hesitate to call if you need anything.  The office number is [phone].

## 2021-12-17 ENCOUNTER — Ambulatory Visit (HOSPITAL_COMMUNITY)
Admission: RE | Admit: 2021-12-17 | Discharge: 2021-12-17 | Disposition: A | Discharge status: Home / Self Care | Payer: BC Managed Care – PPO | Source: Ambulatory Visit | Attending: Gynecologic Oncology | Admitting: Gynecologic Oncology

## 2021-12-17 DIAGNOSIS — N83201 Unspecified ovarian cyst, right side: Secondary | ICD-10-CM | POA: Diagnosis not present

## 2021-12-17 DIAGNOSIS — N83202 Unspecified ovarian cyst, left side: Secondary | ICD-10-CM | POA: Diagnosis not present

## 2021-12-17 DIAGNOSIS — N83209 Unspecified ovarian cyst, unspecified side: Secondary | ICD-10-CM | POA: Diagnosis not present

## 2021-12-17 DIAGNOSIS — D259 Leiomyoma of uterus, unspecified: Secondary | ICD-10-CM | POA: Diagnosis not present

## 2021-12-18 ENCOUNTER — Inpatient Hospital Stay: Payer: BC Managed Care – PPO | Attending: Gynecologic Oncology | Admitting: Gynecologic Oncology

## 2021-12-18 ENCOUNTER — Encounter: Payer: Self-pay | Admitting: Gynecologic Oncology

## 2021-12-18 DIAGNOSIS — N83209 Unspecified ovarian cyst, unspecified side: Secondary | ICD-10-CM | POA: Diagnosis not present

## 2021-12-18 NOTE — Progress Notes (Signed)
Gynecologic Oncology Telehealth Consult Note: Gyn-Onc  I connected with Heather Bailey on 12/18/21 at  3:40 PM EDT by telephone and verified that I am speaking with the correct person using two identifiers.  I discussed the limitations, risks, security and privacy concerns of performing an evaluation and management service by telemedicine and the availability of in-person appointments. I also discussed with the patient that there may be a patient responsible charge related to this service. The patient expressed understanding and agreed to proceed.  Other persons participating in the visit and their role in the encounter: none.  Patient's location: home Provider's location: [facility]  Reason for Visit: follow-up ultrasound  Treatment History: The patient reports being followed for at least the last 3-4 years for cyst on her ovaries.  At the time that this was initially found, she did not have insurance.  Now she has insurance and is following with her OB/GYN regularly.   Pelvic ultrasound exam performed on 04/30/2021 at [facility] showed a uterus measuring 6.3 x 6.3 x 4.1 cm with an endometrial lining of 7.3 mm.  Left ovary measures up to 5.7 cm in size and right up to 5.2 cm in size.  Multiple cysts seen in bilateral ovaries, largest on the right measures up to 3.2 cm and on the left measures up to 3.5 cm.   Tumor markers were performed on 11/20/2021.  CEA was 1.9, CA 19-9 was <2 and CA-125 was mildly elevated at 54.9. Pelvic ultrasound exam at [facility] on 11/20/2021 showed a uterus measuring 5.8 x 6.8 x 4.3 cm with an endometrial lining of 3.1 mm.  Left ovary measures 5.3 x 4.3 x 4 cm.  Right ovary measures 4.4 x 3.3 x 2.6 cm.  There are multiple simple cyst seen bilaterally, the 3 largest on the right measuring up to 2.8 cm, along with a small projection within the cyst.  Left also with multiple simple cysts, the largest measuring up to 2.8 cm.  Multiple fibroids noted within the  uterus, largest measuring up to 2.9 cm.  Small amount of free fluid noted within the cul-de-sac.  Interval History: Doing well. No changes from recent visit.   Past Medical/Surgical History: Past Medical History:  Diagnosis Date   Anemia Dx 2003   Arthritis Dx 1999   DVT (deep venous thrombosis) ([place]) 2007   got Lupron to shrink fibroids, developed DVT after (blood thinner for 6 months)   Fibroids    Gestational diabetes    Hyperlipidemia Dx 2000   Hypertension Dx 2000   Hypertensive retinopathy    Thyroid disease Dx 2005    Past Surgical History:  Procedure Laterality Date   CESAREAN SECTION     MYOMECTOMY     x2-3   TUBAL LIGATION     at c-section    Family History  Problem Relation Age of Onset   Hypertension Mother    Stomach cancer Mother    Alzheimer's disease Sister    Colon cancer Neg Hx    Colon polyps Neg Hx    Esophageal cancer Neg Hx    Rectal cancer Neg Hx     Social History   Socioeconomic History   Marital status: Married    Spouse name: Not on file   Number of children: 2   Years of education: Not on file   Highest education level: Bachelor's degree (e.g., BA, AB, BS)  Occupational History   Occupation: Tool crib assistant  Tobacco Use  Smoking status: Never   Smokeless tobacco: Never  Vaping Use   Vaping Use: Never used  Substance and Sexual Activity   Alcohol use: No   Drug use: No   Sexual activity: Not Currently    Birth control/protection: Surgical  Other Topics Concern   Not on file  Social History Narrative   Lives with husband who has diabetes.    Social Determinants of Health   Financial Resource Strain: Not on file  Food Insecurity: Not on file  Transportation Needs: No Transportation Needs (09/05/2019)   PRAPARE - Hydrologist (Medical): No    Lack of Transportation (Non-Medical): No  Physical Activity: Unknown (08/03/2017)   Exercise Vital Sign    Days of Exercise per Week: Patient  refused    Minutes of Exercise per Session: Patient refused  Stress: Not on file  Social Connections: Unknown (08/03/2017)   Social Connection and Isolation Panel [NHANES]    Frequency of Communication with Friends and Family: Patient refused    Frequency of Social Gatherings with Friends and Family: Patient refused    Attends Religious Services: Patient refused    Marine scientist or Organizations: Patient refused    Attends Archivist Meetings: Patient refused    Marital Status: Patient refused    Current Medications:  Current Outpatient Medications:    acetaminophen-codeine (TYLENOL #3) 300-30 MG tablet, Take 2 tablets by mouth every 8 (eight) hours as needed for moderate pain., Disp: 60 tablet, Rfl: 0   atorvastatin (LIPITOR) 40 MG tablet, Take 1 tablet (40 mg total) by mouth daily., Disp: 90 tablet, Rfl: 2   cycloSPORINE (RESTASIS) 0.05 % ophthalmic emulsion, Instill 1 drop in both eyes twice a day, Disp: 60 each, Rfl: 3   diclofenac sodium (VOLTAREN) 1 % GEL, Apply 2 g topically 4 (four) times daily., Disp: 100 g, Rfl: 1   gabapentin (NEURONTIN) 300 MG capsule, Take 1 capsule (300 mg total) by mouth at bedtime., Disp: 90 capsule, Rfl: 3   glucose blood (TRUE METRIX BLOOD GLUCOSE TEST) test strip, Use as instructed to check blood sugar up to 3 times daily., Disp: 100 each, Rfl: 2   levothyroxine (SYNTHROID) 50 MCG tablet, TAKE 1 TABLET BY MOUTH DAILY, Disp: 30 tablet, Rfl: 0   Lidocaine (HM LIDOCAINE PATCH) 4 % PTCH, Apply 1 patch topically every 12 (twelve) hours as needed., Disp: 30 patch, Rfl: 0   losartan (COZAAR) 25 MG tablet, TAKE ONE-HALF (1/2) TABLET DAILY, Disp: 45 tablet, Rfl: 1   metFORMIN (GLUCOPHAGE-XR) 500 MG 24 hr tablet, TAKE 2 TABLETS DAILY, Disp: 180 tablet, Rfl: 0   methocarbamol (ROBAXIN) 500 MG tablet, Take 1 tablet (500 mg total) by mouth 2 (two) times daily as needed., Disp: 20 tablet, Rfl: 0   metoprolol succinate (TOPROL-XL) 25 MG 24 hr tablet,  TAKE 1 TABLET DAILY, Disp: 90 tablet, Rfl: 1   Multiple Vitamin (MULTIVITAMIN ADULT PO), Take by mouth., Disp: , Rfl:    sertraline (ZOLOFT) 100 MG tablet, TAKE 1 TABLET DAILY, Disp: 90 tablet, Rfl: 1   TRUEPLUS LANCETS 28G MISC, Use to check blood sugar up to 3 times daily., Disp: 100 each, Rfl: 2  Review of Symptoms: Pertinent positives as per HPI.  Physical Exam: There were no vitals taken for this visit. Deferred given limitations of phone visit.  Laboratory & Radiologic Studies: Pelvic ultrasound 12/17/21: IMPRESSION: 1. Multiple uterine fibroids. 2. Bilateral simple ovarian cysts. Multiple ovarian cysts, largest 4.3 centimeters. Recommend  follow-up pelvic US in 3-6 months. Note: This recommendation does not apply to premenarchal patients or to those with increased risk (genetic, family history, elevated tumor markers or other high-risk factors) of ovarian cancer. Reference: Radiology 2019 Nov; 293(2):359-371.  Assessment & Plan: Heather Bailey is a 61 y.o. woman with history of fibroids and bilateral ovarian cysts with recent change noted on outside ultrasound, mild elevation of CA-125.  Discussed recent repeat ultrasound which shows simple appearing bilateral adnexal masses. No complex features. Discussed options of diagnostic surgery now versus close surveillance with follow-up ultrasound in 3- 6 months. Her preference is to avoid surgery if possible. We will plan to repeat ultrasound here in 3-6 months and if stable with simple features, can discharge back to her OBGYN.  I discussed the assessment and treatment plan with the patient. The patient was provided with an opportunity to ask questions and all were answered. The patient agreed with the plan and demonstrated an understanding of the instructions.   The patient was advised to call back or see an in-person evaluation if the symptoms worsen or if the condition fails to improve as anticipated.   8 minutes of total time was  spent for this patient encounter, including preparation, face-to-face counseling with the patient and coordination of care, and documentation of the encounter.   Jeral Pinch, MD  Division of Gynecologic Oncology  Department of Obstetrics and Gynecology  [facility]

## 2021-12-24 ENCOUNTER — Telehealth: Payer: Self-pay

## 2021-12-24 NOTE — Telephone Encounter (Signed)
Pt aware of ultrasound scheduled for 05/11/22 @ 11:00am. She is aware to arrive at 10:30 with a full bladder. Drink 32oz 1 hour before appointment. She voiced an understanding.

## 2021-12-25 ENCOUNTER — Other Ambulatory Visit: Payer: Self-pay | Admitting: Internal Medicine

## 2021-12-25 DIAGNOSIS — E1169 Type 2 diabetes mellitus with other specified complication: Secondary | ICD-10-CM

## 2021-12-26 ENCOUNTER — Other Ambulatory Visit: Payer: Self-pay | Admitting: Internal Medicine

## 2021-12-26 DIAGNOSIS — F419 Anxiety disorder, unspecified: Secondary | ICD-10-CM

## 2021-12-26 NOTE — Telephone Encounter (Signed)
Requested Prescriptions  Pending Prescriptions Disp Refills  . sertraline (ZOLOFT) 100 MG tablet [Pharmacy Med Name: SERTRALINE HCL TABS '100MG'$ ] 90 tablet 0    Sig: TAKE 1 TABLET DAILY     Psychiatry:  Antidepressants - SSRI - sertraline Passed - 12/26/2021  1:11 AM      Passed - AST in normal range and within 360 days    AST  Date Value Ref Range Status  09/03/2021 22 15 - 41 U/L Final         Passed - ALT in normal range and within 360 days    ALT  Date Value Ref Range Status  09/03/2021 21 0 - 44 U/L Final         Passed - Completed PHQ-2 or PHQ-9 in the last 360 days      Passed - Valid encounter within last 6 months    Recent Outpatient Visits          2 months ago Closed fracture of multiple ribs of left side with routine healing, subsequent encounter   [place], MD   3 months ago Syncope, unspecified syncope type   [place], MD   7 months ago Annual physical exam   [place] Karle Plumber B, MD   10 months ago Type 2 diabetes mellitus with obesity [facility])   [place], MD   1 year ago Type 2 diabetes mellitus with obesity [facility])   [place], [place], [place]

## 2022-01-24 ENCOUNTER — Ambulatory Visit
Admission: EM | Admit: 2022-01-24 | Discharge: 2022-01-24 | Disposition: A | Discharge status: Home / Self Care | Payer: BC Managed Care – PPO | Attending: Physician Assistant | Admitting: Physician Assistant

## 2022-01-24 ENCOUNTER — Encounter: Payer: Self-pay | Admitting: Emergency Medicine

## 2022-01-24 DIAGNOSIS — Z79899 Other long term (current) drug therapy: Secondary | ICD-10-CM | POA: Insufficient documentation

## 2022-01-24 DIAGNOSIS — U071 COVID-19: Secondary | ICD-10-CM | POA: Insufficient documentation

## 2022-01-24 DIAGNOSIS — R051 Acute cough: Secondary | ICD-10-CM | POA: Insufficient documentation

## 2022-01-24 DIAGNOSIS — J069 Acute upper respiratory infection, unspecified: Secondary | ICD-10-CM | POA: Insufficient documentation

## 2022-01-24 DIAGNOSIS — R0981 Nasal congestion: Secondary | ICD-10-CM | POA: Insufficient documentation

## 2022-01-24 LAB — RESP PANEL BY RT-PCR (RSV, FLU A&B, COVID)  RVPGX2
Influenza A by PCR: NEGATIVE
Influenza B by PCR: NEGATIVE
Resp Syncytial Virus by PCR: NEGATIVE
SARS Coronavirus 2 by RT PCR: POSITIVE — AB

## 2022-01-24 MED ORDER — ALBUTEROL SULFATE HFA 108 (90 BASE) MCG/ACT IN AERS
2.0000 | INHALATION_SPRAY | Freq: Once | RESPIRATORY_TRACT | Status: AC
  Administered 2022-01-24: 2 via RESPIRATORY_TRACT

## 2022-01-24 MED ORDER — ACETAMINOPHEN 325 MG PO TABS
650.0000 mg | ORAL_TABLET | Freq: Once | ORAL | Status: AC
  Administered 2022-01-24: 650 mg via ORAL

## 2022-01-24 MED ORDER — BENZONATATE 100 MG PO CAPS: 100.0000 mg | ORAL_CAPSULE | Freq: Three times a day (TID) | ORAL | 0 refills | Status: DC

## 2022-01-24 NOTE — ED Provider Notes (Signed)
[facility]    CSN: [id number] Arrival date & time: 01/24/22  1025      History   Chief Complaint Chief Complaint  Patient presents with   Cough   Headache    Chest congestion   Weakness    HPI Heather Bailey is a 61 y.o. female.   Patient presents today with a several day history of URI symptoms.  She reports symptoms began on Thursday (3 days ago).  Reports headache, cough, congestion, generalized weakness.  Denies any fever, nausea, vomiting.  She denies known sick contacts but does work around many people.  She has tried Coricidin HBP without improvement.  She does have a history of allergies but takes medication for this as needed.  She denies history of allergies, asthma, COPD.  She does not smoke.  Denies any recent antibiotic or steroid use.  She has had COVID approximately a year ago.  She has had COVID vaccines.     Past Medical History:  Diagnosis Date   Anemia Dx 2003   Arthritis Dx 1999   DVT (deep venous thrombosis) ([place]) 2007   got Lupron to shrink fibroids, developed DVT after (blood thinner for 6 months)   Fibroids    Gestational diabetes    Hyperlipidemia Dx 2000   Hypertension Dx 2000   Hypertensive retinopathy    Thyroid disease Dx 2005    Patient Active Problem List   Diagnosis Date Noted   Hyperlipidemia associated with type 2 diabetes mellitus ([place]) 11/03/2019   Class 1 obesity due to excess calories with serious comorbidity and body mass index (BMI) of 32.0 to 32.9 in adult 11/03/2019   Floaters in visual field, right 11/15/2018   Onychomycosis 08/15/2018   Achilles tendinitis of right lower extremity 08/15/2018   Hot flashes 02/23/2017   Chronic low back pain 04/12/2015   Dry skin 04/12/2015   Chronic dryness of both eyes 08/28/2014   DM2 (diabetes mellitus, type 2) ([place]) 03/19/2014   Hypothyroidism 03/19/2014   Hypertension 03/19/2014   Hyperlipidemia 03/19/2014   Depression 03/19/2014    Past Surgical History:   Procedure Laterality Date   CESAREAN SECTION     MYOMECTOMY     x2-3   TUBAL LIGATION     at c-section    OB History     Gravida  2   Para  2   Term  2   Preterm      AB      Living  2      SAB      IAB      Ectopic      Multiple      Live Births               Home Medications    Prior to Admission medications   Medication Sig Start Date End Date Taking? Authorizing Provider  benzonatate (TESSALON) 100 MG capsule Take 1 capsule (100 mg total) by mouth every 8 (eight) hours. 01/24/22  Yes Reyanna Baley K, PA-C  acetaminophen-codeine (TYLENOL #3) 300-30 MG tablet Take 2 tablets by mouth every 8 (eight) hours as needed for moderate pain. 10/17/21   Ladell Pier, MD  atorvastatin (LIPITOR) 40 MG tablet Take 1 tablet (40 mg total) by mouth daily. 11/03/21   Ladell Pier, MD  cycloSPORINE (RESTASIS) 0.05 % ophthalmic emulsion Instill 1 drop in both eyes twice a day 10/31/20     diclofenac sodium (VOLTAREN) 1 % GEL Apply 2 g topically  4 (four) times daily. 03/03/19   Fulp, Cammie, MD  gabapentin (NEURONTIN) 300 MG capsule Take 1 capsule (300 mg total) by mouth at bedtime. 11/06/20   Argentina Donovan, PA-C  glucose blood (TRUE METRIX BLOOD GLUCOSE TEST) test strip Use as instructed to check blood sugar up to 3 times daily. 08/17/18   Ladell Pier, MD  levothyroxine (SYNTHROID) 50 MCG tablet TAKE 1 TABLET BY MOUTH DAILY 05/20/21   Ladell Pier, MD  Lidocaine (HM LIDOCAINE PATCH) 4 % PTCH Apply 1 patch topically every 12 (twelve) hours as needed. 09/03/21   Loni Beckwith, PA-C  losartan (COZAAR) 25 MG tablet TAKE ONE-HALF (1/2) TABLET DAILY 09/26/21   Ladell Pier, MD  metFORMIN (GLUCOPHAGE-XR) 500 MG 24 hr tablet TAKE 2 TABLETS DAILY (MUST HAVE OFFICE VISIT FOR REFILLS) 12/25/21   Ladell Pier, MD  methocarbamol (ROBAXIN) 500 MG tablet Take 1 tablet (500 mg total) by mouth 2 (two) times daily as needed. 03/27/21   Aundra Dubin, PA-C   metoprolol succinate (TOPROL-XL) 25 MG 24 hr tablet TAKE 1 TABLET DAILY 09/26/21   Ladell Pier, MD  Multiple Vitamin (MULTIVITAMIN ADULT PO) Take by mouth.    [provider]  sertraline (ZOLOFT) 100 MG tablet TAKE 1 TABLET DAILY 12/26/21   Ladell Pier, MD  TRUEPLUS LANCETS 28G MISC Use to check blood sugar up to 3 times daily. 08/17/18   Ladell Pier, MD    Family History Family History  Problem Relation Age of Onset   Hypertension Mother    Stomach cancer Mother    Alzheimer's disease Sister    Colon cancer Neg Hx    Colon polyps Neg Hx    Esophageal cancer Neg Hx    Rectal cancer Neg Hx     Social History Social History   Tobacco Use   Smoking status: Never   Smokeless tobacco: Never  Vaping Use   Vaping Use: Never used  Substance Use Topics   Alcohol use: No   Drug use: No     Allergies   Zithromax [azithromycin dihydrate]   Review of Systems Review of Systems  Constitutional:  Negative for activity change, appetite change, fatigue and fever.  HENT:  Positive for congestion. Negative for sinus pressure, sneezing and sore throat.   Respiratory:  Positive for cough. Negative for shortness of breath.   Cardiovascular:  Negative for chest pain.  Gastrointestinal:  Negative for abdominal pain, diarrhea, nausea and vomiting.  Neurological:  Positive for headaches. Negative for dizziness and light-headedness.     Physical Exam Triage Vital Signs ED Triage Vitals  Enc Vitals Group     BP 01/24/22 1101 123/81     Pulse Rate 01/24/22 1101 (!) 101     Resp 01/24/22 1101 18     Temp 01/24/22 1101 (!) 100.5 F (38.1 C)     Temp src --      SpO2 01/24/22 1101 95 %     Weight --      Height --      Head Circumference --      Peak Flow --      Pain Score 01/24/22 1103 10     Pain Loc --      Pain Edu? --      Excl. in [place]? --    No data found.  Updated Vital Signs BP 120/80 (BP Location: Left Arm)   Pulse 90   Temp (!) 100.5 F  (  38.1 C)   Resp 18   SpO2 96%   Visual Acuity Right Eye Distance:   Left Eye Distance:   Bilateral Distance:    Right Eye Near:   Left Eye Near:    Bilateral Near:     Physical Exam Vitals reviewed.  Constitutional:      General: She is awake. She is not in acute distress.    Appearance: Normal appearance. She is well-developed. She is not ill-appearing.     Comments: Very pleasant female appears stated age in no acute distress sitting comfortably in exam room  HENT:     Head: Normocephalic and atraumatic.     Right Ear: Tympanic membrane, ear canal and external ear normal. Tympanic membrane is not erythematous or bulging.     Left Ear: Tympanic membrane, ear canal and external ear normal. Tympanic membrane is not erythematous or bulging.     Nose:     Right Sinus: No maxillary sinus tenderness or frontal sinus tenderness.     Left Sinus: No maxillary sinus tenderness or frontal sinus tenderness.     Mouth/Throat:     Pharynx: Uvula midline. Posterior oropharyngeal erythema present. No oropharyngeal exudate.  Cardiovascular:     Rate and Rhythm: Normal rate and regular rhythm.     Heart sounds: Normal heart sounds, S1 normal and S2 normal. No murmur heard. Pulmonary:     Effort: Pulmonary effort is normal.     Breath sounds: Normal breath sounds. No wheezing, rhonchi or rales.     Comments: Reactive cough with deep breathing Psychiatric:        Behavior: Behavior is cooperative.      UC Treatments / Results  Labs (all labs ordered are listed, but only abnormal results are displayed) Labs Reviewed  RESP PANEL BY RT-PCR (RSV, FLU A&B, COVID)  [id number]    EKG   Radiology No results found.  Procedures Procedures (including critical care time)  Medications Ordered in UC Medications  acetaminophen (TYLENOL) tablet 650 mg (650 mg Oral Given 01/24/22 1105)  albuterol (VENTOLIN HFA) 108 (90 Base) MCG/ACT inhaler 2 puff (2 puffs Inhalation Given 01/24/22 1129)     Initial Impression / Assessment and Plan / UC Course  I have reviewed the triage vital signs and the nursing notes.  Pertinent labs & imaging results that were available during my care of the patient were reviewed by me and considered in my medical decision making (see chart for details).     Concern for viral etiology given clinical presentation.  No evidence of acute infection on physical exam that would warrant initiation of antibiotics.  Patient was given albuterol with improvement of cough symptoms.  Recommended she use this every 4-6 hours as needed.  She was prescribed Tessalon for cough.  Can use antihistamines and Flonase for additional symptom relief.  She is to rest and drink plenty of fluid.  COVID, flu, RSV testing was obtained-results pending.  We will contact her if this is positive.  She is a candidate for antivirals given her history of hypertension and age.  She had labs obtained 09/03/2021 with creatinine of 1.17 and calculated creatinine clearance of 69.06 mL/min.  Discussed that if she develops any chest pain, shortness of breath, nausea, vomiting, worsening cough, lethargy she needs to be seen immediately.  Strict return precautions given.  Final Clinical Impressions(s) / UC Diagnoses   Final diagnoses:  Upper respiratory tract infection, unspecified type  Acute cough  Nasal congestion  Discharge Instructions      Monitor your MyChart for your results.  We will contact you if you are positive.  Use Tessalon for cough.  Use over-the-counter medications for additional symptom relief.  Make sure you rest and drink plenty fluid.  You can use your albuterol every 4-6 hours as needed.  If you have any chest pain, shortness of breath, nausea, vomiting, weakness you need to go to the emergency room immediately.     ED Prescriptions     Medication Sig Dispense Auth. Provider   benzonatate (TESSALON) 100 MG capsule Take 1 capsule (100 mg total) by mouth every 8  (eight) hours. 21 capsule Bethanie Bloxom K, PA-C      PDMP not reviewed this encounter.   Terrilee Croak, PA-C 01/24/22 1141

## 2022-01-24 NOTE — Discharge Instructions (Signed)
Monitor your MyChart for your results.  We will contact you if you are positive.  Use Tessalon for cough.  Use over-the-counter medications for additional symptom relief.  Make sure you rest and drink plenty fluid.  You can use your albuterol every 4-6 hours as needed.  If you have any chest pain, shortness of breath, nausea, vomiting, weakness you need to go to the emergency room immediately.

## 2022-01-24 NOTE — ED Triage Notes (Signed)
Pt is present today with c/o HA, weakness, cough, and chest congestion. Pt sx started Thursday

## 2022-01-26 ENCOUNTER — Telehealth (HOSPITAL_COMMUNITY): Payer: Self-pay | Admitting: Emergency Medicine

## 2022-01-26 MED ORDER — MOLNUPIRAVIR EUA 200MG CAPSULE: 4.0000 | ORAL_CAPSULE | Freq: Two times a day (BID) | ORAL | 0 refills | Status: AC

## 2022-02-17 ENCOUNTER — Other Ambulatory Visit: Payer: Self-pay | Admitting: Internal Medicine

## 2022-02-17 DIAGNOSIS — I10 Essential (primary) hypertension: Secondary | ICD-10-CM

## 2022-02-17 NOTE — Telephone Encounter (Signed)
Medication Refill - Medication: losartan (COZAAR) 25 MG tablet  Has the patient contacted their pharmacy? No.  Preferred Pharmacy (with phone number or street name):  [facility] [place], [place] Phone:  [phone]  Fax:  [phone]     Has the patient been seen for an appointment in the last year OR does the patient have an upcoming appointment? Yes.    Agent: Please be advised that RX refills may take up to 3 business days. We ask that you follow-up with your pharmacy.

## 2022-02-18 MED ORDER — LOSARTAN POTASSIUM 25 MG PO TABS: 12.5000 mg | ORAL_TABLET | Freq: Every day | ORAL | 0 refills | Status: DC

## 2022-02-18 NOTE — Telephone Encounter (Signed)
Rx sent 

## 2022-03-04 ENCOUNTER — Other Ambulatory Visit: Payer: Self-pay | Admitting: Internal Medicine

## 2022-03-04 DIAGNOSIS — E039 Hypothyroidism, unspecified: Secondary | ICD-10-CM

## 2022-03-23 ENCOUNTER — Other Ambulatory Visit: Payer: Self-pay | Admitting: Internal Medicine

## 2022-03-23 DIAGNOSIS — E039 Hypothyroidism, unspecified: Secondary | ICD-10-CM

## 2022-03-23 MED ORDER — LEVOTHYROXINE SODIUM 50 MCG PO TABS: ORAL_TABLET | ORAL | 1 refills | Status: DC

## 2022-03-25 ENCOUNTER — Other Ambulatory Visit: Payer: Self-pay | Admitting: Internal Medicine

## 2022-03-25 DIAGNOSIS — I10 Essential (primary) hypertension: Secondary | ICD-10-CM

## 2022-03-25 DIAGNOSIS — E1169 Type 2 diabetes mellitus with other specified complication: Secondary | ICD-10-CM

## 2022-03-26 ENCOUNTER — Other Ambulatory Visit: Payer: Self-pay | Admitting: Internal Medicine

## 2022-03-26 DIAGNOSIS — F419 Anxiety disorder, unspecified: Secondary | ICD-10-CM

## 2022-03-31 ENCOUNTER — Ambulatory Visit: Payer: BC Managed Care – PPO | Admitting: Internal Medicine

## 2022-04-02 ENCOUNTER — Ambulatory Visit: Payer: BC Managed Care – PPO | Attending: Internal Medicine | Admitting: Internal Medicine

## 2022-04-02 ENCOUNTER — Encounter: Payer: Self-pay | Admitting: Internal Medicine

## 2022-04-02 VITALS — BP 140/80 | HR 64 | Resp 16 | Ht 66.0 in | Wt 198.0 lb

## 2022-04-02 DIAGNOSIS — E1169 Type 2 diabetes mellitus with other specified complication: Secondary | ICD-10-CM

## 2022-04-02 DIAGNOSIS — Z23 Encounter for immunization: Secondary | ICD-10-CM

## 2022-04-02 DIAGNOSIS — E039 Hypothyroidism, unspecified: Secondary | ICD-10-CM | POA: Diagnosis not present

## 2022-04-02 DIAGNOSIS — F320 Major depressive disorder, single episode, mild: Secondary | ICD-10-CM

## 2022-04-02 DIAGNOSIS — E669 Obesity, unspecified: Secondary | ICD-10-CM | POA: Diagnosis not present

## 2022-04-02 DIAGNOSIS — I152 Hypertension secondary to endocrine disorders: Secondary | ICD-10-CM

## 2022-04-02 DIAGNOSIS — Z6831 Body mass index (BMI) 31.0-31.9, adult: Secondary | ICD-10-CM

## 2022-04-02 DIAGNOSIS — E785 Hyperlipidemia, unspecified: Secondary | ICD-10-CM | POA: Diagnosis not present

## 2022-04-02 DIAGNOSIS — E1159 Type 2 diabetes mellitus with other circulatory complications: Secondary | ICD-10-CM

## 2022-04-02 LAB — POCT GLYCOSYLATED HEMOGLOBIN (HGB A1C): HbA1c, POC (prediabetic range): 6.2 % (ref 5.7–6.4)

## 2022-04-02 LAB — GLUCOSE, POCT (MANUAL RESULT ENTRY): POC Glucose: 96 mg/dl (ref 70–99)

## 2022-04-02 MED ORDER — LOSARTAN POTASSIUM 25 MG PO TABS: 25.0000 mg | ORAL_TABLET | Freq: Every day | ORAL | 2 refills | Status: DC

## 2022-04-02 NOTE — Progress Notes (Signed)
Patient ID: Heather Bailey, female    DOB: September 23, 1960  MRN: [id number]  CC: Diabetes   Subjective: Heather Bailey is a 61 y.o. female who presents for chronic ds management Her concerns today include:  Pt with hx of HTN, DM, hypothyroidism, HL, depression, chronic LBP (on Gabapentin), dry skin.    DM: Results for orders placed or performed in visit on 04/02/22  Glucose (CBG)  Result Value Ref Range   POC Glucose 96 70 - 99 mg/dl  HgB A1c  Result Value Ref Range   Hemoglobin A1C     HbA1c POC (<> result, manual entry)     HbA1c, POC (prediabetic range) 6.2 5.7 - 6.4 %   HbA1c, POC (controlled diabetic range)    Not taking Metformin consistently because work schedule keeps changing.  She has med box.  Eating habits not as consistent for same reason.  She usually eats fast food for lunch before going to work. Very active at work walking and climbing stairs   HTN:  BP elev today. Compliant with meds that includes Cozaar 25 mg half a tablet daily and metoprolol. Eating out for lunch most days; feels there is increased salt in the fast foods. Has device to check blood pressure but has not been checking consistently.  Had headache yesterday so checked blood pressure at that time and reading was 138/110.  Hypothyroid:  taking Levothyroxine 50 mcg consistently.  Missed 3 days recently; did not realize that she had another bottle that had been mailed to her.  She found it and started taking again.  Depression: On last visit she felt her depression had worsened some because she was dealing with her employer in regards to the rib fracture that she sustained at work.  PHQ-9 score today is better.  She feels that her depression is somewhat better.  Taking Zoloft 100 mg daily.  Does not feel she needs increased dose at this time.    HL: Last LDL was 118.  Lipitor inc to 40 mg.  Reports compliance with taking the medicine.  Complains of some soreness in the left ear.  Not constant.    HM:   decline shingrix.  Due for flu vaccine and COVID booster.  Needs physical before end of yr so she can get money out of HSA.  Patient Active Problem List   Diagnosis Date Noted   Hyperlipidemia associated with type 2 diabetes mellitus ([place]) 11/03/2019   Class 1 obesity due to excess calories with serious comorbidity and body mass index (BMI) of 32.0 to 32.9 in adult 11/03/2019   Floaters in visual field, right 11/15/2018   Onychomycosis 08/15/2018   Achilles tendinitis of right lower extremity 08/15/2018   Hot flashes 02/23/2017   Chronic low back pain 04/12/2015   Dry skin 04/12/2015   Chronic dryness of both eyes 08/28/2014   DM2 (diabetes mellitus, type 2) ([place]) 03/19/2014   Hypothyroidism 03/19/2014   Hypertension 03/19/2014   Hyperlipidemia 03/19/2014   Depression 03/19/2014     Current Outpatient Medications on File Prior to Visit  Medication Sig Dispense Refill   acetaminophen-codeine (TYLENOL #3) 300-30 MG tablet Take 2 tablets by mouth every 8 (eight) hours as needed for moderate pain. 60 tablet 0   atorvastatin (LIPITOR) 40 MG tablet Take 1 tablet (40 mg total) by mouth daily. 90 tablet 2   benzonatate (TESSALON) 100 MG capsule Take 1 capsule (100 mg total) by mouth every 8 (eight) hours. 21 capsule 0   cycloSPORINE (  RESTASIS) 0.05 % ophthalmic emulsion Instill 1 drop in both eyes twice a day 60 each 3   diclofenac sodium (VOLTAREN) 1 % GEL Apply 2 g topically 4 (four) times daily. 100 g 1   gabapentin (NEURONTIN) 300 MG capsule Take 1 capsule (300 mg total) by mouth at bedtime. 90 capsule 3   glucose blood (TRUE METRIX BLOOD GLUCOSE TEST) test strip Use as instructed to check blood sugar up to 3 times daily. 100 each 2   levothyroxine (SYNTHROID) 50 MCG tablet TAKE 1 TABLET DAILY (MUST KEEP UPCOMING OFFICE VISIT FOR REFILLS) 90 tablet 1   Lidocaine (HM LIDOCAINE PATCH) 4 % PTCH Apply 1 patch topically every 12 (twelve) hours as needed. 30 patch 0   metFORMIN (GLUCOPHAGE-XR)  500 MG 24 hr tablet TAKE 2 TABLETS DAILY (MUST HAVE OFFICE VISIT FOR REFILLS) 60 tablet 0   methocarbamol (ROBAXIN) 500 MG tablet Take 1 tablet (500 mg total) by mouth 2 (two) times daily as needed. 20 tablet 0   metoprolol succinate (TOPROL-XL) 25 MG 24 hr tablet TAKE 1 TABLET DAILY 30 tablet 0   Multiple Vitamin (MULTIVITAMIN ADULT PO) Take by mouth.     sertraline (ZOLOFT) 100 MG tablet TAKE 1 TABLET DAILY 30 tablet 0   TRUEPLUS LANCETS 28G MISC Use to check blood sugar up to 3 times daily. 100 each 2   No current facility-administered medications on file prior to visit.    Allergies  Allergen Reactions   Zithromax [Azithromycin Dihydrate]     Social History   Socioeconomic History   Marital status: Married    Spouse name: Not on file   Number of children: 2   Years of education: Not on file   Highest education level: Bachelor's degree (e.g., BA, AB, BS)  Occupational History   Occupation: Tool crib assistant  Tobacco Use   Smoking status: Never   Smokeless tobacco: Never  Vaping Use   Vaping Use: Never used  Substance and Sexual Activity   Alcohol use: No   Drug use: No   Sexual activity: Not Currently    Birth control/protection: Surgical  Other Topics Concern   Not on file  Social History Narrative   Lives with husband who has diabetes.    Social Determinants of Health   Financial Resource Strain: Not on file  Food Insecurity: Not on file  Transportation Needs: No Transportation Needs (09/05/2019)   PRAPARE - Hydrologist (Medical): No    Lack of Transportation (Non-Medical): No  Physical Activity: Unknown (08/03/2017)   Exercise Vital Sign    Days of Exercise per Week: Patient refused    Minutes of Exercise per Session: Patient refused  Stress: Not on file  Social Connections: Unknown (08/03/2017)   Social Connection and Isolation Panel [NHANES]    Frequency of Communication with Friends and Family: Patient refused    Frequency  of Social Gatherings with Friends and Family: Patient refused    Attends Religious Services: Patient refused    Active Member of Clubs or Organizations: Patient refused    Attends Archivist Meetings: Patient refused    Marital Status: Patient refused  Intimate Partner Violence: Unknown (08/03/2017)   Humiliation, Afraid, Rape, and Kick questionnaire    Fear of Current or Ex-Partner: Patient refused    Emotionally Abused: Patient refused    Physically Abused: Patient refused    Sexually Abused: Patient refused    Family History  Problem Relation Age of Onset  Hypertension Mother    Stomach cancer Mother    Alzheimer's disease Sister    Colon cancer Neg Hx    Colon polyps Neg Hx    Esophageal cancer Neg Hx    Rectal cancer Neg Hx     Past Surgical History:  Procedure Laterality Date   CESAREAN SECTION     MYOMECTOMY     x2-3   TUBAL LIGATION     at c-section    ROS: Review of Systems Negative except as stated above  PHYSICAL EXAM: BP (!) 140/80   Pulse 64   Resp 16   Ht '5\' 6"'$  (1.676 m)   Wt 198 lb (89.8 kg)   SpO2 99%   BMI 31.96 kg/m   Wt Readings from Last 3 Encounters:  04/02/22 198 lb (89.8 kg)  12/12/21 191 lb 6.4 oz (86.8 kg)  11/10/21 196 lb 6.9 oz (89.1 kg)    Physical Exam  General appearance - alert, well appearing, older African-American female and in no distress Mental status - normal mood, behavior, speech, dress, motor activity, and thought processes Ears - bilateral TM's and external ear canals normal Neck - supple, no significant adenopathy Chest - clear to auscultation, no wheezes, rales or rhonchi, symmetric air entry Heart - normal rate, regular rhythm, normal S1, S2, no murmurs, rubs, clicks or gallops Extremities - peripheral pulses normal, no pedal edema, no clubbing or cyanosis     04/02/2022   12:31 PM 10/17/2021    3:11 PM 05/05/2021   10:23 AM  Depression screen PHQ 2/9  Decreased Interest '1 2 1  '$ Down, Depressed,  Hopeless 0 2 0  PHQ - 2 Score '1 4 1  '$ Altered sleeping 0 2   Tired, decreased energy 2 2   Change in appetite 2 2   Feeling bad or failure about yourself  0 2   Trouble concentrating 0 2   Moving slowly or fidgety/restless 0 1   Suicidal thoughts 0 0   PHQ-9 Score 5 15        Latest Ref Rng & Units 09/03/2021    1:29 PM 11/06/2020    9:24 AM 09/05/2020   10:45 AM  CMP  Glucose 70 - 99 mg/dL 165  89  86   BUN 6 - 20 mg/dL '15  18  14   '$ Creatinine 0.44 - 1.00 mg/dL 1.17  1.17  1.06   Sodium 135 - 145 mmol/L 138  139  139   Potassium 3.5 - 5.1 mmol/L 3.7  4.4  4.5   Chloride 98 - 111 mmol/L 104  102  101   CO2 22 - 32 mmol/L '27  24  22   '$ Calcium 8.9 - 10.3 mg/dL 9.6  10.1  10.0   Total Protein 6.5 - 8.1 g/dL 8.2  7.7    Total Bilirubin 0.3 - 1.2 mg/dL 0.6  <0.2    Alkaline Phos 38 - 126 U/L 72  92    AST 15 - 41 U/L 22  11    ALT 0 - 44 U/L 21  12     Lipid Panel     Component Value Date/Time   CHOL 187 10/17/2021 1607   TRIG 109 10/17/2021 1607   HDL 49 10/17/2021 1607   CHOLHDL 3.8 10/17/2021 1607   CHOLHDL 4.3 12/19/2015 1620   VLDL 20 12/19/2015 1620   LDLCALC 118 (H) 10/17/2021 1607    CBC    Component Value Date/Time   WBC 5.9 09/03/2021  1305   RBC 4.35 09/03/2021 1305   HGB 12.5 09/03/2021 1305   HGB 13.0 05/03/2020 1624   HCT 39.2 09/03/2021 1305   HCT 40.2 05/03/2020 1624   PLT 268 09/03/2021 1305   PLT 321 05/03/2020 1624   MCV 90.1 09/03/2021 1305   MCV 88 05/03/2020 1624   MCH 28.7 09/03/2021 1305   MCHC 31.9 09/03/2021 1305   RDW 13.2 09/03/2021 1305   RDW 13.1 05/03/2020 1624    ASSESSMENT AND PLAN: 1. Type 2 diabetes mellitus with obesity (HCC) At goal.  Continue metformin. Dietary counseling given.  Encourage better meal planning so that she is not eating out as much. - Glucose (CBG) - HgB A1c - Comprehensive metabolic panel - CBC  2. Hypertension associated with diabetes ([place]) Not at goal. Increase Cozaar to 25 mg daily.  Continue  Toprol 25 mg daily. Try to check blood pressure at least twice a week with goal being 130/80 or lower. - losartan (COZAAR) 25 MG tablet; Take 1 tablet (25 mg total) by mouth daily.  Dispense: 90 tablet; Refill: 2  3. Acquired hypothyroidism Continue levothyroxine. - TSH  4. Hyperlipidemia associated with type 2 diabetes mellitus (HCC) Continue atorvastatin - Lipid panel  5. Mild major depression ([place]) Continue Zoloft.  Stable.  6. Need for immunization against influenza - Flu Vaccine QUAD 79moIM (Fluarix, Fluzone & Alfiuria Quad PF)     Patient was given the opportunity to ask questions.  Patient verbalized understanding of the plan and was able to repeat key elements of the plan.   This documentation was completed using DRadio producer  Any transcriptional errors are unintentional.  Orders Placed This Encounter  Procedures   Flu Vaccine QUAD 688moM (Fluarix, Fluzone & Alfiuria Quad PF)   TSH   Comprehensive metabolic panel   CBC   Lipid panel   Glucose (CBG)   HgB A1c     Requested Prescriptions   Signed Prescriptions Disp Refills   losartan (COZAAR) 25 MG tablet 90 tablet 2    Sig: Take 1 tablet (25 mg total) by mouth daily.    Return in about 1 month (around 05/03/2022) for physical.  DeKarle PlumberMD, FARosalita Chessman

## 2022-04-03 LAB — LIPID PANEL
Chol/HDL Ratio: 3.9 ratio (ref 0.0–4.4)
Cholesterol, Total: 182 mg/dL (ref 100–199)
HDL: 47 mg/dL (ref >39)
LDL Chol Calc (NIH): 120 mg/dL — ABNORMAL HIGH (ref 0–99)
Triglycerides: 80 mg/dL (ref 0–149)
VLDL Cholesterol Cal: 15 mg/dL (ref 5–40)

## 2022-04-03 LAB — COMPREHENSIVE METABOLIC PANEL
ALT: 17 IU/L (ref 0–32)
AST: 15 IU/L (ref 0–40)
Albumin/Globulin Ratio: 1.5 (ref 1.2–2.2)
Albumin: 4.6 g/dL (ref 3.9–4.9)
Alkaline Phosphatase: 96 IU/L (ref 44–121)
BUN/Creatinine Ratio: 14 (ref 12–28)
BUN: 16 mg/dL (ref 8–27)
Bilirubin Total: 0.3 mg/dL (ref 0.0–1.2)
CO2: 23 mmol/L (ref 20–29)
Calcium: 10.2 mg/dL (ref 8.7–10.3)
Chloride: 104 mmol/L (ref 96–106)
Creatinine, Ser: 1.13 mg/dL — ABNORMAL HIGH (ref 0.57–1.00)
Globulin, Total: 3 g/dL (ref 1.5–4.5)
Glucose: 96 mg/dL (ref 70–99)
Potassium: 5.1 mmol/L (ref 3.5–5.2)
Sodium: 142 mmol/L (ref 134–144)
Total Protein: 7.6 g/dL (ref 6.0–8.5)
eGFR: 55 mL/min/{1.73_m2} — ABNORMAL LOW (ref >59)

## 2022-04-03 LAB — CBC
Hematocrit: 39.6 % (ref 34.0–46.6)
Hemoglobin: 12.5 g/dL (ref 11.1–15.9)
MCH: 28.3 pg (ref 26.6–33.0)
MCHC: 31.6 g/dL (ref 31.5–35.7)
MCV: 90 fL (ref 79–97)
Platelets: 278 10*3/uL (ref 150–450)
RBC: 4.41 x10E6/uL (ref 3.77–5.28)
RDW: 13.6 % (ref 11.7–15.4)
WBC: 4.6 10*3/uL (ref 3.4–10.8)

## 2022-04-03 LAB — TSH: TSH: 2.51 u[IU]/mL (ref 0.450–4.500)

## 2022-05-04 ENCOUNTER — Ambulatory Visit: Payer: BC Managed Care – PPO | Admitting: Internal Medicine

## 2022-05-11 ENCOUNTER — Ambulatory Visit (HOSPITAL_COMMUNITY)
Admission: RE | Admit: 2022-05-11 | Discharge: 2022-05-11 | Disposition: A | Discharge status: Home / Self Care | Payer: BC Managed Care – PPO | Source: Ambulatory Visit | Attending: Gynecologic Oncology | Admitting: Gynecologic Oncology

## 2022-05-11 DIAGNOSIS — D251 Intramural leiomyoma of uterus: Secondary | ICD-10-CM | POA: Diagnosis not present

## 2022-05-11 DIAGNOSIS — N83201 Unspecified ovarian cyst, right side: Secondary | ICD-10-CM | POA: Diagnosis not present

## 2022-05-11 DIAGNOSIS — N83209 Unspecified ovarian cyst, unspecified side: Secondary | ICD-10-CM | POA: Diagnosis not present

## 2022-05-12 ENCOUNTER — Telehealth: Payer: Self-pay | Admitting: Surgery

## 2022-05-12 ENCOUNTER — Encounter: Payer: Self-pay | Admitting: Gynecologic Oncology

## 2022-05-12 ENCOUNTER — Encounter: Payer: Self-pay | Admitting: Surgery

## 2022-05-12 NOTE — Telephone Encounter (Signed)
Message sent to patient via mychart letting her know that Dr Berline Lopes will call her to discuss ultrasound results.

## 2022-05-12 NOTE — Telephone Encounter (Signed)
Patient called in requesting results from the ultrasound she had done yesterday. Dr Berline Lopes notified.

## 2022-05-12 NOTE — Telephone Encounter (Signed)
Please let the patient know that I will call her after clinic today. Thank you

## 2022-05-13 ENCOUNTER — Telehealth: Payer: Self-pay | Admitting: Gynecologic Oncology

## 2022-05-13 NOTE — Telephone Encounter (Signed)
Called patient again to discuss ultrasound. No answer. Left VM.  Jeral Pinch MD Gynecologic Oncology

## 2022-05-21 ENCOUNTER — Telehealth: Payer: Self-pay | Admitting: Gynecologic Oncology

## 2022-05-21 ENCOUNTER — Ambulatory Visit: Payer: BC Managed Care – PPO | Attending: Internal Medicine | Admitting: Internal Medicine

## 2022-05-21 ENCOUNTER — Encounter: Payer: Self-pay | Admitting: Internal Medicine

## 2022-05-21 VITALS — BP 120/84 | HR 70 | Temp 97.6°F | Ht 66.0 in | Wt 195.0 lb

## 2022-05-21 DIAGNOSIS — Z23 Encounter for immunization: Secondary | ICD-10-CM

## 2022-05-21 DIAGNOSIS — N1831 Chronic kidney disease, stage 3a: Secondary | ICD-10-CM | POA: Diagnosis not present

## 2022-05-21 DIAGNOSIS — N83209 Unspecified ovarian cyst, unspecified side: Secondary | ICD-10-CM

## 2022-05-21 DIAGNOSIS — E669 Obesity, unspecified: Secondary | ICD-10-CM

## 2022-05-21 DIAGNOSIS — J3489 Other specified disorders of nose and nasal sinuses: Secondary | ICD-10-CM

## 2022-05-21 DIAGNOSIS — Z0000 Encounter for general adult medical examination without abnormal findings: Secondary | ICD-10-CM | POA: Diagnosis not present

## 2022-05-21 DIAGNOSIS — I152 Hypertension secondary to endocrine disorders: Secondary | ICD-10-CM

## 2022-05-21 DIAGNOSIS — E1169 Type 2 diabetes mellitus with other specified complication: Secondary | ICD-10-CM

## 2022-05-21 DIAGNOSIS — Z8742 Personal history of other diseases of the female genital tract: Secondary | ICD-10-CM

## 2022-05-21 DIAGNOSIS — Z2911 Encounter for prophylactic immunotherapy for respiratory syncytial virus (RSV): Secondary | ICD-10-CM

## 2022-05-21 DIAGNOSIS — E1159 Type 2 diabetes mellitus with other circulatory complications: Secondary | ICD-10-CM

## 2022-05-21 MED ORDER — RSVPREF3 VAC RECOMB ADJUVANTED 120 MCG/0.5ML IM SUSR: 0.5000 mL | Freq: Once | INTRAMUSCULAR | 0 refills | Status: AC

## 2022-05-21 NOTE — Progress Notes (Signed)
Patient ID: Heather Bailey, female    DOB: [birth year]  MRN: [id number]  CC: Annual Exam (Annual physical./Runny nose - poss allergy to pet./Already received flu vax this season.)   Subjective: Heather Bailey is a 61 y.o. female who presents for annual exam Her concerns today include:  Pt with hx of HTN, DM, hypothyroidism, HL, depression, chronic LBP (on Gabapentin), dry skin, CKD 3a.    HM:  Due for COVID booster and RSV   Reports chronic rhinorrhea x several mths.  No nasal congestion.  Clear mucous. No itchy throat or eyes.  No lacrimation  DM:  meter not working well.  Will get new one through her job.  She has cut  back some on eating junk foods for lunch.  Work schedule will change this month.  Feels that eating habits will be better as she will be able to cook more with the change in work schedule.  Weight down 3 pounds since last visit in October. Still moving a lot at work Compliant with Metformin HTN:  did not take meds as yet this a.m.   Reports BP has been good when she checks at drug store:  range 130s/84-85.  BP was elev when she saw GYN last mth but she was nervous at the visit Does not cook with salt but feels the fast foods she eats have increase salt GFR has remained stable in the 50's.  Does not routinely use NSAIDs; occasional use.  Took some Excedrine on Thx-giving day for HA.  Patient Active Problem List   Diagnosis Date Noted   Hyperlipidemia associated with type 2 diabetes mellitus ([place]) 11/03/2019   Class 1 obesity due to excess calories with serious comorbidity and body mass index (BMI) of 32.0 to 32.9 in adult 11/03/2019   Floaters in visual field, right 11/15/2018   Onychomycosis 08/15/2018   Achilles tendinitis of right lower extremity 08/15/2018   Hot flashes 02/23/2017   Chronic low back pain 04/12/2015   Dry skin 04/12/2015   Chronic dryness of both eyes 08/28/2014   DM2 (diabetes mellitus, type 2) ([place]) 03/19/2014   Hypothyroidism 03/19/2014    Hypertension 03/19/2014   Hyperlipidemia 03/19/2014   Depression 03/19/2014     Current Outpatient Medications on File Prior to Visit  Medication Sig Dispense Refill   acetaminophen-codeine (TYLENOL #3) 300-30 MG tablet Take 2 tablets by mouth every 8 (eight) hours as needed for moderate pain. 60 tablet 0   atorvastatin (LIPITOR) 40 MG tablet Take 1 tablet (40 mg total) by mouth daily. 90 tablet 2   benzonatate (TESSALON) 100 MG capsule Take 1 capsule (100 mg total) by mouth every 8 (eight) hours. 21 capsule 0   cycloSPORINE (RESTASIS) 0.05 % ophthalmic emulsion Instill 1 drop in both eyes twice a day 60 each 3   diclofenac sodium (VOLTAREN) 1 % GEL Apply 2 g topically 4 (four) times daily. 100 g 1   gabapentin (NEURONTIN) 300 MG capsule Take 1 capsule (300 mg total) by mouth at bedtime. 90 capsule 3   glucose blood (TRUE METRIX BLOOD GLUCOSE TEST) test strip Use as instructed to check blood sugar up to 3 times daily. 100 each 2   levothyroxine (SYNTHROID) 50 MCG tablet TAKE 1 TABLET DAILY (MUST KEEP UPCOMING OFFICE VISIT FOR REFILLS) 90 tablet 1   Lidocaine (HM LIDOCAINE PATCH) 4 % PTCH Apply 1 patch topically every 12 (twelve) hours as needed. 30 patch 0   losartan (COZAAR) 25 MG tablet Take 1 tablet (  25 mg total) by mouth daily. 90 tablet 2   metFORMIN (GLUCOPHAGE-XR) 500 MG 24 hr tablet TAKE 2 TABLETS DAILY (MUST HAVE OFFICE VISIT FOR REFILLS) 60 tablet 0   methocarbamol (ROBAXIN) 500 MG tablet Take 1 tablet (500 mg total) by mouth 2 (two) times daily as needed. 20 tablet 0   metoprolol succinate (TOPROL-XL) 25 MG 24 hr tablet TAKE 1 TABLET DAILY 30 tablet 0   Multiple Vitamin (MULTIVITAMIN ADULT PO) Take by mouth.     sertraline (ZOLOFT) 100 MG tablet TAKE 1 TABLET DAILY 30 tablet 0   TRUEPLUS LANCETS 28G MISC Use to check blood sugar up to 3 times daily. 100 each 2   No current facility-administered medications on file prior to visit.    Allergies  Allergen Reactions   Zithromax  [Azithromycin Dihydrate]     Social History   Socioeconomic History   Marital status: Married    Spouse name: Not on file   Number of children: 2   Years of education: Not on file   Highest education level: Bachelor's degree (e.g., BA, AB, BS)  Occupational History   Occupation: Tool crib assistant  Tobacco Use   Smoking status: Never   Smokeless tobacco: Never  Vaping Use   Vaping Use: Never used  Substance and Sexual Activity   Alcohol use: No   Drug use: No   Sexual activity: Not Currently    Birth control/protection: Surgical  Other Topics Concern   Not on file  Social History Narrative   Lives with husband who has diabetes.    Social Determinants of Health   Financial Resource Strain: Not on file  Food Insecurity: Not on file  Transportation Needs: No Transportation Needs (09/05/2019)   PRAPARE - Hydrologist (Medical): No    Lack of Transportation (Non-Medical): No  Physical Activity: Unknown (08/03/2017)   Exercise Vital Sign    Days of Exercise per Week: Patient refused    Minutes of Exercise per Session: Patient refused  Stress: Not on file  Social Connections: Unknown (08/03/2017)   Social Connection and Isolation Panel [NHANES]    Frequency of Communication with Friends and Family: Patient refused    Frequency of Social Gatherings with Friends and Family: Patient refused    Attends Religious Services: Patient refused    Active Member of Clubs or Organizations: Patient refused    Attends Archivist Meetings: Patient refused    Marital Status: Patient refused  Intimate Partner Violence: Unknown (08/03/2017)   Humiliation, Afraid, Rape, and Kick questionnaire    Fear of Current or Ex-Partner: Patient refused    Emotionally Abused: Patient refused    Physically Abused: Patient refused    Sexually Abused: Patient refused    Family History  Problem Relation Age of Onset   Hypertension Mother    Stomach cancer Mother     Alzheimer's disease Sister    Colon cancer Neg Hx    Colon polyps Neg Hx    Esophageal cancer Neg Hx    Rectal cancer Neg Hx     Past Surgical History:  Procedure Laterality Date   CESAREAN SECTION     MYOMECTOMY     x2-3   TUBAL LIGATION     at c-section    ROS: Review of Systems  HENT:  Negative for hearing loss and sore throat.   Eyes:  Negative for visual disturbance.       Wears reading glasses.  Up to date  with DM eye exam.  Respiratory:  Negative for cough, chest tightness and shortness of breath.   Cardiovascular:  Negative for chest pain.  Gastrointestinal:        Moving bowels ok.    Genitourinary:  Negative for difficulty urinating.   Negative except as stated above  PHYSICAL EXAM: BP 120/84   Pulse 70   Temp 97.6 F (36.4 C) (Oral)   Ht '5\' 6"'$  (1.676 m)   Wt 195 lb (88.5 kg)   SpO2 98%   BMI 31.47 kg/m   Wt Readings from Last 3 Encounters:  05/21/22 195 lb (88.5 kg)  04/02/22 198 lb (89.8 kg)  12/12/21 191 lb 6.4 oz (86.8 kg)    Physical Exam  General appearance - alert, well appearing, and in no distress Mental status - normal mood, behavior, speech, dress, motor activity, and thought processes Eyes - pupils equal and reactive, extraocular eye movements intact Ears - bilateral TM's and external ear canals normal Nose - normal and patent, no erythema, discharge or polyps Mouth - mucous membranes moist, pharynx normal without lesions Neck - supple, no significant adenopathy Lymphatics - no palpable lymphadenopathy, no hepatosplenomegaly Chest - clear to auscultation, no wheezes, rales or rhonchi, symmetric air entry Heart - normal rate, regular rhythm, normal S1, S2, no murmurs, rubs, clicks or gallops Abdomen - soft, nontender, nondistended, no masses or organomegaly Neurological - cranial nerves II through XII intact, motor and sensory grossly normal bilaterally Extremities - peripheral pulses normal, no pedal edema, no clubbing or  cyanosis      Latest Ref Rng & Units 04/02/2022   12:25 PM 09/03/2021    1:29 PM 11/06/2020    9:24 AM  CMP  Glucose 70 - 99 mg/dL 96  165  89   BUN 8 - 27 mg/dL '16  15  18   '$ Creatinine 0.57 - 1.00 mg/dL 1.13  1.17  1.17   Sodium 134 - 144 mmol/L 142  138  139   Potassium 3.5 - 5.2 mmol/L 5.1  3.7  4.4   Chloride 96 - 106 mmol/L 104  104  102   CO2 20 - 29 mmol/L '23  27  24   '$ Calcium 8.7 - 10.3 mg/dL 10.2  9.6  10.1   Total Protein 6.0 - 8.5 g/dL 7.6  8.2  7.7   Total Bilirubin 0.0 - 1.2 mg/dL 0.3  0.6  <0.2   Alkaline Phos 44 - 121 IU/L 96  72  92   AST 0 - 40 IU/L '15  22  11   '$ ALT 0 - 32 IU/L '17  21  12    '$ Lipid Panel     Component Value Date/Time   CHOL 182 04/02/2022 1225   TRIG 80 04/02/2022 1225   HDL 47 04/02/2022 1225   CHOLHDL 3.9 04/02/2022 1225   CHOLHDL 4.3 12/19/2015 1620   VLDL 20 12/19/2015 1620   LDLCALC 120 (H) 04/02/2022 1225    CBC    Component Value Date/Time   WBC 4.6 04/02/2022 1225   WBC 5.9 09/03/2021 1305   RBC 4.41 04/02/2022 1225   RBC 4.35 09/03/2021 1305   HGB 12.5 04/02/2022 1225   HCT 39.6 04/02/2022 1225   PLT 278 04/02/2022 1225   MCV 90 04/02/2022 1225   MCH 28.3 04/02/2022 1225   MCH 28.7 09/03/2021 1305   MCHC 31.6 04/02/2022 1225   MCHC 31.9 09/03/2021 1305   RDW 13.6 04/02/2022 1225    ASSESSMENT AND PLAN:  1. Annual physical exam Patient up-to-date with mammogram, Pap smear, colon cancer screening.  2. Hypertension associated with diabetes ([place]) Close to goal.  Continue Cozaar 25 mg daily and metoprolol  3. Type 2 diabetes mellitus with obesity ([place]) Patient advised to eliminate sugary drinks from the diet, cut back on portion sizes especially of white carbohydrates, eat more white lean meat like chicken [place] and seafood instead of beef or pork and incorporate fresh fruits and vegetables into the diet daily. Encouraged her to move as much as she can with goal of at least 30 minutes 5 days a week of moderate  intensity exercise.  4. Stage 3a chronic kidney disease ([facility]) Stable.  Avoid long-term use of NSAIDs.  5. Rhinorrhea Recommend use of Atrovent nasal spray as needed.  Patient declined stating she does not like spraying stuff in her nose.  6. Need for RSV immunization Discussed recommendation for RSV vaccine in patients over the age of 58.  She is agreeable to getting the vaccine.  I have printed a prescription and given it to her to get at any outside pharmacy. - RSV vaccine recomb adjuvanted (AREXVY) 120 MCG/0.5ML injection; Inject 0.5 mLs into the muscle once for 1 dose.  Dispense: 0.5 mL; Refill: 0  7. Need for second booster dose of COVID-19 vaccine Patient plans to get the booster at an outside pharmacy.   Patient was given the opportunity to ask questions.  Patient verbalized understanding of the plan and was able to repeat key elements of the plan.   This documentation was completed using Radio producer.  Any transcriptional errors are unintentional.  No orders of the defined types were placed in this encounter.    Requested Prescriptions   Signed Prescriptions Disp Refills   RSV vaccine recomb adjuvanted (AREXVY) 120 MCG/0.5ML injection 0.5 mL 0    Sig: Inject 0.5 mLs into the muscle once for 1 dose.    Return in about 4 months (around 09/20/2022).  Karle Plumber, MD, FACP

## 2022-05-21 NOTE — Telephone Encounter (Signed)
Attempted to call patient again to discuss ultrasound. No answer. Left VM.  Jeral Pinch MD Gynecologic Oncology

## 2022-05-21 NOTE — Telephone Encounter (Signed)
Patient return my call.  Discussed ultrasound findings.  No growth in bilateral cystic masses which look most consistent with hydrosalpinx.  Patient is asymptomatic.  She still wishes to avoid surgery.  Discussed treatment options in terms of planning for surgery versus continued imaging surveillance.  Given appearance of these masses, I think it is very reasonable to continue with imaging surveillance.  My recommendation is that we plan for a pelvic MRI in approximately 4 months.  Patient amenable.  I will place this order and asked my clinic to call the patient to get this scheduled.

## 2022-05-21 NOTE — Patient Instructions (Signed)
Preventive Care 49-61 Years Old, Female Preventive care refers to lifestyle choices and visits with your health care provider that can promote health and wellness. Preventive care visits are also called wellness exams. What can I expect for my preventive care visit? Counseling Your health care provider may ask you questions about your: Medical history, including: Past medical problems. Family medical history. Pregnancy history. Current health, including: Menstrual cycle. Method of birth control. Emotional well-being. Home life and relationship well-being. Sexual activity and sexual health. Lifestyle, including: Alcohol, nicotine or tobacco, and drug use. Access to firearms. Diet, exercise, and sleep habits. Work and work Statistician. Sunscreen use. Safety issues such as seatbelt and bike helmet use. Physical exam Your health care provider will check your: Height and weight. These may be used to calculate your BMI (body mass index). BMI is a measurement that tells if you are at a healthy weight. Waist circumference. This measures the distance around your waistline. This measurement also tells if you are at a healthy weight and may help predict your risk of certain diseases, such as type 2 diabetes and high blood pressure. Heart rate and blood pressure. Body temperature. Skin for abnormal spots. What immunizations do I need?  Vaccines are usually given at various ages, according to a schedule. Your health care provider will recommend vaccines for you based on your age, medical history, and lifestyle or other factors, such as travel or where you work. What tests do I need? Screening Your health care provider may recommend screening tests for certain conditions. This may include: Lipid and cholesterol levels. Diabetes screening. This is done by checking your blood sugar (glucose) after you have not eaten for a while (fasting). Pelvic exam and Pap test. Hepatitis B test. Hepatitis C  test. HIV (human immunodeficiency virus) test. STI (sexually transmitted infection) testing, if you are at risk. Lung cancer screening. Colorectal cancer screening. Mammogram. Talk with your health care provider about when you should start having regular mammograms. This may depend on whether you have a family history of breast cancer. BRCA-related cancer screening. This may be done if you have a family history of breast, ovarian, tubal, or peritoneal cancers. Bone density scan. This is done to screen for osteoporosis. Talk with your health care provider about your test results, treatment options, and if necessary, the need for more tests. Follow these instructions at home: Eating and drinking  Eat a diet that includes fresh fruits and vegetables, whole grains, lean protein, and low-fat dairy products. Take vitamin and mineral supplements as recommended by your health care provider. Do not drink alcohol if: Your health care provider tells you not to drink. You are pregnant, may be pregnant, or are planning to become pregnant. If you drink alcohol: Limit how much you have to 0-1 drink a day. Know how much alcohol is in your drink. In the [place] one drink equals one 12 oz bottle of beer (355 mL), one 5 oz glass of wine (148 mL), or one 1 oz glass of hard liquor (44 mL). Lifestyle Brush your teeth every morning and night with fluoride toothpaste. Floss one time each day. Exercise for at least 30 minutes 5 or more days each week. Do not use any products that contain nicotine or tobacco. These products include cigarettes, chewing tobacco, and vaping devices, such as e-cigarettes. If you need help quitting, ask your health care provider. Do not use drugs. If you are sexually active, practice safe sex. Use a condom or other form of protection to  prevent STIs. If you do not wish to become pregnant, use a form of birth control. If you plan to become pregnant, see your health care provider for a  prepregnancy visit. Take aspirin only as told by your health care provider. Make sure that you understand how much to take and what form to take. Work with your health care provider to find out whether it is safe and beneficial for you to take aspirin daily. Find healthy ways to manage stress, such as: Meditation, yoga, or listening to music. Journaling. Talking to a trusted person. Spending time with friends and family. Minimize exposure to UV radiation to reduce your risk of skin cancer. Safety Always wear your seat belt while driving or riding in a vehicle. Do not drive: If you have been drinking alcohol. Do not ride with someone who has been drinking. When you are tired or distracted. While texting. If you have been using any mind-altering substances or drugs. Wear a helmet and other protective equipment during sports activities. If you have firearms in your house, make sure you follow all gun safety procedures. Seek help if you have been physically or sexually abused. What's next? Visit your health care provider once a year for an annual wellness visit. Ask your health care provider how often you should have your eyes and teeth checked. Stay up to date on all vaccines. This information is not intended to replace advice given to you by your health care provider. Make sure you discuss any questions you have with your health care provider. Document Revised: 11/27/2020 Document Reviewed: 11/27/2020 Elsevier Patient Education  [place].

## 2022-05-22 ENCOUNTER — Telehealth: Payer: Self-pay | Admitting: *Deleted

## 2022-05-22 NOTE — Telephone Encounter (Signed)
Per Dr Berline Lopes patient needs to be scheduled for a MRI 4 month; LMOM to call the office back. Need to ask the patient for a MRI location

## 2022-05-26 NOTE — Telephone Encounter (Signed)
Spoke with the patient and scheduled an MRI on 3/13 at 8 am per Dr Berline Lopes

## 2022-06-23 ENCOUNTER — Telehealth: Payer: Self-pay

## 2022-06-23 ENCOUNTER — Encounter: Payer: Self-pay | Admitting: Internal Medicine

## 2022-06-23 NOTE — Telephone Encounter (Signed)
Message reviewed. This was not discussed on last visit with pt.  I sent pt a message inquiring whether or not she requested this.

## 2022-06-23 NOTE — Telephone Encounter (Signed)
I received a message from Romeo Rabon, [facility] stating that [place] [employer] called and said that the patient has requested at [facility]. They faxed documents for Dr Wynetta Emery to complete.  I found the documents under Media in Epic and will give to Dr Wynetta Emery for review.

## 2022-06-24 ENCOUNTER — Other Ambulatory Visit: Payer: Self-pay | Admitting: Internal Medicine

## 2022-06-24 DIAGNOSIS — F419 Anxiety disorder, unspecified: Secondary | ICD-10-CM

## 2022-06-24 DIAGNOSIS — E1169 Type 2 diabetes mellitus with other specified complication: Secondary | ICD-10-CM

## 2022-06-24 DIAGNOSIS — I10 Essential (primary) hypertension: Secondary | ICD-10-CM

## 2022-06-25 ENCOUNTER — Other Ambulatory Visit: Payer: Self-pay | Admitting: Internal Medicine

## 2022-06-25 DIAGNOSIS — F419 Anxiety disorder, unspecified: Secondary | ICD-10-CM

## 2022-06-25 DIAGNOSIS — M545 Low back pain, unspecified: Secondary | ICD-10-CM

## 2022-06-25 DIAGNOSIS — I10 Essential (primary) hypertension: Secondary | ICD-10-CM

## 2022-06-25 DIAGNOSIS — E669 Obesity, unspecified: Secondary | ICD-10-CM

## 2022-06-25 MED ORDER — GABAPENTIN 300 MG PO CAPS: 300.0000 mg | ORAL_CAPSULE | Freq: Every day | ORAL | 3 refills | Status: DC

## 2022-06-25 MED ORDER — SERTRALINE HCL 100 MG PO TABS: 100.0000 mg | ORAL_TABLET | Freq: Every day | ORAL | 1 refills | Status: DC

## 2022-06-25 MED ORDER — METOPROLOL SUCCINATE ER 25 MG PO TB24: 25.0000 mg | ORAL_TABLET | Freq: Every day | ORAL | 1 refills | Status: DC

## 2022-06-25 MED ORDER — METFORMIN HCL ER 500 MG PO TB24: 1000.0000 mg | ORAL_TABLET | Freq: Every day | ORAL | 1 refills | Status: DC

## 2022-07-13 ENCOUNTER — Other Ambulatory Visit: Payer: Self-pay | Admitting: Internal Medicine

## 2022-07-13 DIAGNOSIS — E669 Obesity, unspecified: Secondary | ICD-10-CM

## 2022-07-13 DIAGNOSIS — E1169 Type 2 diabetes mellitus with other specified complication: Secondary | ICD-10-CM

## 2022-07-13 NOTE — Telephone Encounter (Signed)
Requested Prescriptions  Pending Prescriptions Disp Refills   atorvastatin (LIPITOR) 40 MG tablet [Pharmacy Med Name: ATORVASTATIN TABS '40MG'$ ] 90 tablet 0    Sig: TAKE 1 TABLET DAILY     Cardiovascular:  Antilipid - Statins Failed - 07/13/2022  1:31 AM      Failed - Lipid Panel in normal range within the last 12 months    Cholesterol, Total  Date Value Ref Range Status  04/02/2022 182 100 - 199 mg/dL Final   LDL Chol Calc (NIH)  Date Value Ref Range Status  04/02/2022 120 (H) 0 - 99 mg/dL Final   HDL  Date Value Ref Range Status  04/02/2022 47 >39 mg/dL Final   Triglycerides  Date Value Ref Range Status  04/02/2022 80 0 - 149 mg/dL Final         Passed - Patient is not pregnant      Passed - Valid encounter within last 12 months    Recent Outpatient Visits           1 month ago Annual physical exam   [place] Karle Plumber B, MD   3 months ago Type 2 diabetes mellitus with obesity [facility])   [place] Karle Plumber B, MD   8 months ago Closed fracture of multiple ribs of left side with routine healing, subsequent encounter   [place], MD   10 months ago Syncope, unspecified syncope type   [place] Ladell Pier, MD   1 year ago Annual physical exam   [place], MD       Future Appointments             In 2 months Ladell Pier, MD [place]

## 2022-08-26 ENCOUNTER — Encounter (HOSPITAL_COMMUNITY): Payer: Self-pay | Admitting: Radiology

## 2022-08-26 ENCOUNTER — Ambulatory Visit (HOSPITAL_COMMUNITY)
Admission: RE | Admit: 2022-08-26 | Discharge: 2022-08-26 | Disposition: A | Discharge status: Home / Self Care | Payer: BC Managed Care – PPO | Source: Ambulatory Visit | Attending: Gynecologic Oncology | Admitting: Gynecologic Oncology

## 2022-08-26 DIAGNOSIS — Z8742 Personal history of other diseases of the female genital tract: Secondary | ICD-10-CM

## 2022-08-26 DIAGNOSIS — N838 Other noninflammatory disorders of ovary, fallopian tube and broad ligament: Secondary | ICD-10-CM | POA: Diagnosis not present

## 2022-08-26 DIAGNOSIS — N83209 Unspecified ovarian cyst, unspecified side: Secondary | ICD-10-CM | POA: Diagnosis not present

## 2022-08-26 MED ORDER — GADOBUTROL 1 MMOL/ML IV SOLN
9.0000 mL | Freq: Once | INTRAVENOUS | Status: AC | PRN
  Administered 2022-08-26: 9 mL via INTRAVENOUS

## 2022-09-02 ENCOUNTER — Telehealth: Payer: Self-pay | Admitting: Gynecologic Oncology

## 2022-09-02 NOTE — Telephone Encounter (Signed)
Called patient. Discussed MRI findings which continue to be overall reassuring. Bilateral adnexal cysts stable in size, continue to have mostly benign/reassuring findings. I offered the patient continued imaging surveillance (f/u MRI in 6 months) versus surgery. She'd like to think about the decision and will call me back. We also discussed incidental finding of liver lesion - this is a new finding for her. We will plan to get MRI abdomen to further eval (either with pelvic MRI in 6 months or sooner if she'd like to proceed with surgery).  Jeral Pinch MD Gynecologic Oncology

## 2022-09-08 ENCOUNTER — Telehealth: Payer: Self-pay | Admitting: Oncology

## 2022-09-08 NOTE — Telephone Encounter (Signed)
Left a message for Heather Bailey regarding her decision for following with imaging vs surgery.  Requested a return call.

## 2022-09-09 ENCOUNTER — Other Ambulatory Visit: Payer: Self-pay | Admitting: Gynecologic Oncology

## 2022-09-09 DIAGNOSIS — N83209 Unspecified ovarian cyst, unspecified side: Secondary | ICD-10-CM

## 2022-09-09 DIAGNOSIS — K769 Liver disease, unspecified: Secondary | ICD-10-CM

## 2022-09-09 NOTE — Progress Notes (Signed)
Patient wishes to proceed with continued imaging surveillance. MRI pelvis order. MRI abdomen also ordered to completely characterize incidental findings of 1.5 cm liver lesion.

## 2022-09-09 NOTE — Telephone Encounter (Signed)
Heather Bailey called back and said she has decided on imaging surveillance for one more time.  Advised I will let Dr. Berline Lopes know and that we will call her back when the MRI is scheduled for 6 months.

## 2022-09-09 NOTE — Telephone Encounter (Signed)
Order is in for both pelvic and abdominal. She and I have previously discussed rads recommendation to get abdominal MRI to look at liver lesion.

## 2022-09-11 NOTE — Telephone Encounter (Signed)
Left a message with appointments for the MRI's on 03/12/23 arrival at 8:30 am at [facility] with instructions for NPO 4 hours before.

## 2022-09-21 ENCOUNTER — Ambulatory Visit: Payer: BC Managed Care – PPO | Attending: Internal Medicine | Admitting: Internal Medicine

## 2022-09-21 ENCOUNTER — Encounter: Payer: Self-pay | Admitting: Internal Medicine

## 2022-09-21 VITALS — BP 113/77 | HR 70 | Temp 98.4°F | Ht 66.0 in | Wt 195.0 lb

## 2022-09-21 DIAGNOSIS — E039 Hypothyroidism, unspecified: Secondary | ICD-10-CM | POA: Diagnosis not present

## 2022-09-21 DIAGNOSIS — E1169 Type 2 diabetes mellitus with other specified complication: Secondary | ICD-10-CM | POA: Diagnosis not present

## 2022-09-21 DIAGNOSIS — E785 Hyperlipidemia, unspecified: Secondary | ICD-10-CM

## 2022-09-21 DIAGNOSIS — I152 Hypertension secondary to endocrine disorders: Secondary | ICD-10-CM

## 2022-09-21 DIAGNOSIS — E1159 Type 2 diabetes mellitus with other circulatory complications: Secondary | ICD-10-CM

## 2022-09-21 DIAGNOSIS — E669 Obesity, unspecified: Secondary | ICD-10-CM

## 2022-09-21 DIAGNOSIS — N1831 Chronic kidney disease, stage 3a: Secondary | ICD-10-CM

## 2022-09-21 LAB — GLUCOSE, POCT (MANUAL RESULT ENTRY): POC Glucose: 98 mg/dl (ref 70–99)

## 2022-09-21 LAB — POCT GLYCOSYLATED HEMOGLOBIN (HGB A1C): HbA1c, POC (controlled diabetic range): 6.1 % (ref 0.0–7.0)

## 2022-09-21 MED ORDER — ATORVASTATIN CALCIUM 40 MG PO TABS: 40.0000 mg | ORAL_TABLET | Freq: Every day | ORAL | 1 refills | Status: DC

## 2022-09-21 MED ORDER — LEVOTHYROXINE SODIUM 50 MCG PO TABS: ORAL_TABLET | ORAL | 1 refills | Status: DC

## 2022-09-21 NOTE — Progress Notes (Signed)
Patient ID: Heather Bailey, female    DOB: [birth year]  MRN: [id number]  CC: Diabetes (DM f/u.  Med refills. )   Subjective: Heather Bailey is a 62 y.o. female who presents for chronic disease management Her concerns today include:  Pt with hx of HTN, DM, hypothyroidism, HL, depression, chronic LBP (on Gabapentin), dry skin, CKD 3a.    HTN: Compliant with Cozaar 25 mg and metoprolol XL 25 mg daily Trying to limit salt No CP/SOB/LE edema.  DM: Results for orders placed or performed in visit on 09/21/22  POCT glucose (manual entry)  Result Value Ref Range   POC Glucose 98 70 - 99 mg/dl  POCT glycosylated hemoglobin (Hb A1C)  Result Value Ref Range   Hemoglobin A1C     HbA1c POC (<> result, manual entry)     HbA1c, POC (prediabetic range)     HbA1c, POC (controlled diabetic range) 6.1 0.0 - 7.0 %  Checks BS but not recently Compliant with metformin XR 1000 mg daily. Cooking little more; eating out 3x/wk Feet bothering her.  She points to her midfoot on the dorsal surface.  Attributed to wearing steel toe boots at work.  Plans to retire within a few months. Has appt for eye coming up   CKD 3: GFR remains in the 50s. Not on NSAIDS  Hypothyroid:  compliant with Levothyroxine 50 mcg daily.  Last TSH 6 months ago was 2.5.  HL: Reports compliance with taking atorvastatin 40 mg daily. Last LDL was 120.  Patient Active Problem List   Diagnosis Date Noted   Hyperlipidemia associated with type 2 diabetes mellitus 11/03/2019   Class 1 obesity due to excess calories with serious comorbidity and body mass index (BMI) of 32.0 to 32.9 in adult 11/03/2019   Floaters in visual field, right 11/15/2018   Onychomycosis 08/15/2018   Achilles tendinitis of right lower extremity 08/15/2018   Hot flashes 02/23/2017   Chronic low back pain 04/12/2015   Dry skin 04/12/2015   Chronic dryness of both eyes 08/28/2014   DM2 (diabetes mellitus, type 2) 03/19/2014   Hypothyroidism 03/19/2014    Hypertension 03/19/2014   Hyperlipidemia 03/19/2014   Depression 03/19/2014     Current Outpatient Medications on File Prior to Visit  Medication Sig Dispense Refill   acetaminophen-codeine (TYLENOL #3) 300-30 MG tablet Take 2 tablets by mouth every 8 (eight) hours as needed for moderate pain. 60 tablet 0   cycloSPORINE (RESTASIS) 0.05 % ophthalmic emulsion Instill 1 drop in both eyes twice a day 60 each 3   diclofenac sodium (VOLTAREN) 1 % GEL Apply 2 g topically 4 (four) times daily. 100 g 1   gabapentin (NEURONTIN) 300 MG capsule Take 1 capsule (300 mg total) by mouth at bedtime. 90 capsule 3   glucose blood (TRUE METRIX BLOOD GLUCOSE TEST) test strip Use as instructed to check blood sugar up to 3 times daily. 100 each 2   Lidocaine (HM LIDOCAINE PATCH) 4 % PTCH Apply 1 patch topically every 12 (twelve) hours as needed. 30 patch 0   losartan (COZAAR) 25 MG tablet Take 1 tablet (25 mg total) by mouth daily. 90 tablet 2   metFORMIN (GLUCOPHAGE-XR) 500 MG 24 hr tablet Take 2 tablets (1,000 mg total) by mouth daily. 180 tablet 1   metoprolol succinate (TOPROL-XL) 25 MG 24 hr tablet Take 1 tablet (25 mg total) by mouth daily. 90 tablet 1   Multiple Vitamin (MULTIVITAMIN ADULT PO) Take by mouth.  sertraline (ZOLOFT) 100 MG tablet Take 1 tablet (100 mg total) by mouth daily. 90 tablet 1   TRUEPLUS LANCETS 28G MISC Use to check blood sugar up to 3 times daily. 100 each 2   No current facility-administered medications on file prior to visit.    Allergies  Allergen Reactions   Zithromax [Azithromycin Dihydrate]     Social History   Socioeconomic History   Marital status: Married    Spouse name: Not on file   Number of children: 2   Years of education: Not on file   Highest education level: Bachelor's degree (e.g., BA, AB, BS)  Occupational History   Occupation: Tool crib assistant  Tobacco Use   Smoking status: Never   Smokeless tobacco: Never  Vaping Use   Vaping Use: Never  used  Substance and Sexual Activity   Alcohol use: No   Drug use: No   Sexual activity: Not Currently    Birth control/protection: Surgical  Other Topics Concern   Not on file  Social History Narrative   Lives with husband who has diabetes.    Social Determinants of Health   Financial Resource Strain: Not on file  Food Insecurity: Not on file  Transportation Needs: No Transportation Needs (09/05/2019)   PRAPARE - Administrator, Civil ServiceTransportation    Lack of Transportation (Medical): No    Lack of Transportation (Non-Medical): No  Physical Activity: Unknown (08/03/2017)   Exercise Vital Sign    Days of Exercise per Week: Patient declined    Minutes of Exercise per Session: Patient declined  Stress: Not on file  Social Connections: Unknown (08/03/2017)   Social Connection and Isolation Panel [NHANES]    Frequency of Communication with Friends and Family: Patient declined    Frequency of Social Gatherings with Friends and Family: Patient declined    Attends Religious Services: Patient declined    Database administratorActive Member of Clubs or Organizations: Patient declined    Attends BankerClub or Organization Meetings: Patient declined    Marital Status: Patient declined  Intimate Partner Violence: Unknown (08/03/2017)   Humiliation, Afraid, Rape, and Kick questionnaire    Fear of Current or Ex-Partner: Patient declined    Emotionally Abused: Patient declined    Physically Abused: Patient declined    Sexually Abused: Patient declined    Family History  Problem Relation Age of Onset   Hypertension Mother    Stomach cancer Mother    Alzheimer's disease Sister    Colon cancer Neg Hx    Colon polyps Neg Hx    Esophageal cancer Neg Hx    Rectal cancer Neg Hx     Past Surgical History:  Procedure Laterality Date   CESAREAN SECTION     MYOMECTOMY     x2-3   TUBAL LIGATION     at c-section    ROS: Review of Systems Negative except as stated above  PHYSICAL EXAM: BP 113/77 (BP Location: Left Arm, Patient  Position: Sitting, Cuff Size: Normal)   Pulse 70   Temp 98.4 F (36.9 C) (Oral)   Ht 5\' 6"  (1.676 m)   Wt 195 lb (88.5 kg)   SpO2 97%   BMI 31.47 kg/m   Wt Readings from Last 3 Encounters:  09/21/22 195 lb (88.5 kg)  05/21/22 195 lb (88.5 kg)  04/02/22 198 lb (89.8 kg)    Physical Exam  General appearance - alert, well appearing, older African-American female and in no distress Mental status - normal mood, behavior, speech, dress, motor activity, and thought  processes Neck - supple, no significant adenopathy Chest - clear to auscultation, no wheezes, rales or rhonchi, symmetric air entry Heart - normal rate, regular rhythm, normal S1, S2, no murmurs, rubs, clicks or gallops Extremities - peripheral pulses normal, no pedal edema, no clubbing or cyanosis      Latest Ref Rng & Units 04/02/2022   12:25 PM 09/03/2021    1:29 PM 11/06/2020    9:24 AM  CMP  Glucose 70 - 99 mg/dL 96  161  89   BUN 8 - 27 mg/dL 16  15  18    Creatinine 0.57 - 1.00 mg/dL 0.96  0.45  4.09   Sodium 134 - 144 mmol/L 142  138  139   Potassium 3.5 - 5.2 mmol/L 5.1  3.7  4.4   Chloride 96 - 106 mmol/L 104  104  102   CO2 20 - 29 mmol/L 23  27  24    Calcium 8.7 - 10.3 mg/dL 81.1  9.6  91.4   Total Protein 6.0 - 8.5 g/dL 7.6  8.2  7.7   Total Bilirubin 0.0 - 1.2 mg/dL 0.3  0.6  <7.8   Alkaline Phos 44 - 121 IU/L 96  72  92   AST 0 - 40 IU/L 15  22  11    ALT 0 - 32 IU/L 17  21  12     Lipid Panel     Component Value Date/Time   CHOL 182 04/02/2022 1225   TRIG 80 04/02/2022 1225   HDL 47 04/02/2022 1225   CHOLHDL 3.9 04/02/2022 1225   CHOLHDL 4.3 12/19/2015 1620   VLDL 20 12/19/2015 1620   LDLCALC 120 (H) 04/02/2022 1225    CBC    Component Value Date/Time   WBC 4.6 04/02/2022 1225   WBC 5.9 09/03/2021 1305   RBC 4.41 04/02/2022 1225   RBC 4.35 09/03/2021 1305   HGB 12.5 04/02/2022 1225   HCT 39.6 04/02/2022 1225   PLT 278 04/02/2022 1225   MCV 90 04/02/2022 1225   MCH 28.3 04/02/2022 1225    MCH 28.7 09/03/2021 1305   MCHC 31.6 04/02/2022 1225   MCHC 31.9 09/03/2021 1305   RDW 13.6 04/02/2022 1225    ASSESSMENT AND PLAN:  1. Type 2 diabetes mellitus with obesity At goal.  Continue metformin. Encouraged her to cut back more on fast food - POCT glucose (manual entry) - POCT glycosylated hemoglobin (Hb A1C) - atorvastatin (LIPITOR) 40 MG tablet; Take 1 tablet (40 mg total) by mouth daily.  Dispense: 90 tablet; Refill: 1 - Microalbumin / creatinine urine ratio  2. Hypertension associated with diabetes At goal.  Continue Cozaar and metoprolol.  3. Stage 3a chronic kidney disease Continue to monitor.  Avoid NSAIDs.  4. Acquired hypothyroidism Continue levothyroxine. - TSH - levothyroxine (SYNTHROID) 50 MCG tablet; TAKE 1 TABLET DAILY (MUST KEEP UPCOMING OFFICE VISIT FOR REFILLS)  Dispense: 90 tablet; Refill: 1  5. Hyperlipidemia associated with type 2 diabetes mellitus - atorvastatin (LIPITOR) 40 MG tablet; Take 1 tablet (40 mg total) by mouth daily.  Dispense: 90 tablet; Refill: 1     Patient was given the opportunity to ask questions.  Patient verbalized understanding of the plan and was able to repeat key elements of the plan.   This documentation was completed using Paediatric nurse.  Any transcriptional errors are unintentional.  Orders Placed This Encounter  Procedures   TSH   Microalbumin / creatinine urine ratio   POCT glucose (manual entry)  POCT glycosylated hemoglobin (Hb A1C)     Requested Prescriptions   Signed Prescriptions Disp Refills   atorvastatin (LIPITOR) 40 MG tablet 90 tablet 1    Sig: Take 1 tablet (40 mg total) by mouth daily.   levothyroxine (SYNTHROID) 50 MCG tablet 90 tablet 1    Sig: TAKE 1 TABLET DAILY (MUST KEEP UPCOMING OFFICE VISIT FOR REFILLS)    No follow-ups on file.  Jonah Blue, MD, FACP

## 2022-09-22 LAB — TSH: TSH: 2.59 u[IU]/mL (ref 0.450–4.500)

## 2022-09-22 LAB — MICROALBUMIN / CREATININE URINE RATIO
Creatinine, Urine: 227.4 mg/dL
Microalb/Creat Ratio: 5 mg/g creat (ref 0–29)
Microalbumin, Urine: 10.4 ug/mL

## 2022-10-02 DIAGNOSIS — H524 Presbyopia: Secondary | ICD-10-CM | POA: Diagnosis not present

## 2022-10-02 DIAGNOSIS — H35033 Hypertensive retinopathy, bilateral: Secondary | ICD-10-CM | POA: Diagnosis not present

## 2022-10-02 DIAGNOSIS — E119 Type 2 diabetes mellitus without complications: Secondary | ICD-10-CM | POA: Diagnosis not present

## 2022-10-02 DIAGNOSIS — H5202 Hypermetropia, left eye: Secondary | ICD-10-CM | POA: Diagnosis not present

## 2022-10-02 DIAGNOSIS — H25813 Combined forms of age-related cataract, bilateral: Secondary | ICD-10-CM | POA: Diagnosis not present

## 2022-10-02 DIAGNOSIS — H40013 Open angle with borderline findings, low risk, bilateral: Secondary | ICD-10-CM | POA: Diagnosis not present

## 2022-10-02 DIAGNOSIS — H52223 Regular astigmatism, bilateral: Secondary | ICD-10-CM | POA: Diagnosis not present

## 2022-10-02 LAB — HM DIABETES EYE EXAM

## 2022-12-10 ENCOUNTER — Other Ambulatory Visit: Payer: Self-pay | Admitting: Internal Medicine

## 2022-12-10 DIAGNOSIS — I152 Hypertension secondary to endocrine disorders: Secondary | ICD-10-CM

## 2022-12-10 NOTE — Telephone Encounter (Signed)
Requested medication (s) are due for refill today:   Yes  Requested medication (s) are on the active medication list:   Yes  Future visit scheduled:   No    Seen 2 months ago   Last ordered: 04/02/2022 #90, 2 refills  Returned because labs are due.      Requested Prescriptions  Pending Prescriptions Disp Refills   losartan (COZAAR) 25 MG tablet [Pharmacy Med Name: LOSARTAN TABS 25MG ] 90 tablet 3    Sig: TAKE 1 TABLET DAILY     Cardiovascular:  Angiotensin Receptor Blockers Failed - 12/10/2022 12:18 AM      Failed - Cr in normal range and within 180 days    Creat  Date Value Ref Range Status  12/19/2015 1.10 (H) 0.50 - 1.05 mg/dL Final    Comment:      For patients > or = 62 years of age: The upper reference limit for Creatinine is approximately 13% higher for people identified as African-American.      Creatinine, Ser  Date Value Ref Range Status  04/02/2022 1.13 (H) 0.57 - 1.00 mg/dL Final   Creatinine, Urine  Date Value Ref Range Status  08/21/2016 101 20 - 320 mg/dL Final         Failed - K in normal range and within 180 days    Potassium  Date Value Ref Range Status  04/02/2022 5.1 3.5 - 5.2 mmol/L Final         Passed - Patient is not pregnant      Passed - Last BP in normal range    BP Readings from Last 1 Encounters:  09/21/22 113/77         Passed - Valid encounter within last 6 months    Recent Outpatient Visits           2 months ago Type 2 diabetes mellitus with obesity [facility])   [place] [facility] Marcine Matar, MD   6 months ago Annual physical exam   [facility] Jonah Blue B, MD   8 months ago Type 2 diabetes mellitus with obesity [facility])   [facility] Jon[facility] Blue B, MD   1 year ago Closed fracture of multiple ribs of left side with routine healing, subsequent encounter   [facility]ine Matar, MD   1 year ago Syncope, unspecified syncope type   [facility]ine Matar, MD

## 2023-01-07 ENCOUNTER — Other Ambulatory Visit: Payer: Self-pay | Admitting: Internal Medicine

## 2023-01-07 ENCOUNTER — Encounter (INDEPENDENT_AMBULATORY_CARE_PROVIDER_SITE_OTHER): Payer: Self-pay

## 2023-01-07 DIAGNOSIS — E669 Obesity, unspecified: Secondary | ICD-10-CM

## 2023-01-07 DIAGNOSIS — F419 Anxiety disorder, unspecified: Secondary | ICD-10-CM

## 2023-01-07 DIAGNOSIS — I10 Essential (primary) hypertension: Secondary | ICD-10-CM

## 2023-01-07 NOTE — Telephone Encounter (Signed)
Requested Prescriptions  Pending Prescriptions Disp Refills   metoprolol succinate (TOPROL-XL) 25 MG 24 hr tablet [Pharmacy Med Name: METOPROLOL SUCCINATE ER TABS 25MG ] 90 tablet 0    Sig: TAKE 1 TABLET DAILY     Cardiovascular:  Beta Blockers Passed - 01/07/2023  8:29 AM      Passed - Last BP in normal range    BP Readings from Last 1 Encounters:  09/21/22 113/77         Passed - Last Heart Rate in normal range    Pulse Readings from Last 1 Encounters:  09/21/22 70         Passed - Valid encounter within last 6 months    Recent Outpatient Visits           3 months ago Type 2 diabetes mellitus with obesity (HCC)   [facility] Marcine Matar, MD   7 months ago Annual physical exam   [facility] Jonah Blue B, MD   9 months ago Type 2 diabetes mellitus with obesity [facility])   [place] [facility] Blue B, MD   1 year ago Closed fracture of multiple ribs of left side with routine healing, subsequent encounter   [facility] Mat[facility], MD   1 year ago Syncope, unspecified syncope type   [facility] Jon[facility] Blue B, MD               metFORMIN (GLUCOPHAGE-XR) 500 MG 24 hr tablet [Pharmacy Med Name: METFORMIN HCL ER TABS 500MG ] 180 tablet 0    Sig: TAKE 2 TABLETS DAILY     Endocrinology:  Diabetes - Biguanides Failed - 01/07/2023  8:29 AM      Failed - Cr in normal range and within 360 days    Creat  Date Value Ref Range Status  12/19/2015 1.10 (H) 0.50 - 1.05 mg/dL Final    Comment:      For patients > or = 62 years of age: The upper reference limit for Creatinine is approximately 13% higher for people identified as African-American.      Creatinine, Ser  Date Value Ref Range Status  04/02/2022 1.13 (H) 0.57 - 1.00 mg/dL Final   Creatinine, Urine  Date Value Ref Range Status   08/21/2016 101 20 - 320 mg/dL Final         Failed - eGFR in normal range and within 360 days    GFR, Est African American  Date Value Ref Range Status  12/19/2015 65 >=60 mL/min Final   GFR calc Af Amer  Date Value Ref Range Status  05/03/2020 60 >59 mL/min/1.73 Final    Comment:    **In accordance with recommendations from the NKF-ASN Task force,**   [facility] is in the process of updating its eGFR calculation to the   2021 CKD-EPI creatinine equation that estimates kidney function   without a race variable.    GFR, Est Non African American  Date Value Ref Range Status  12/19/2015 57 (L) >=60 mL/min Final   GFR, Estimated  Date Value Ref Range Status  09/03/2021 53 (L) >60 mL/min Final    Comment:    (NOTE) Calculated using the CKD-EPI Creatinine Equation (2021)    eGFR  Date Value Ref Range Status  04/02/2022 55 (L) >59 mL/min/1.73 Final         Failed -  B12 Level in normal range and within 720 days    No results found for: "VITAMINB12"       Failed - CBC within normal limits and completed in the last 12 months    WBC  Date Value Ref Range Status  04/02/2022 4.6 3.4 - 10.8 x10E3/uL Final  09/03/2021 5.9 4.0 - 10.5 K/uL Final   RBC  Date Value Ref Range Status  04/02/2022 4.41 3.77 - 5.28 x10E6/uL Final  09/03/2021 4.35 3.87 - 5.11 MIL/uL Final   Hemoglobin  Date Value Ref Range Status  04/02/2022 12.5 11.1 - 15.9 g/dL Final   Hematocrit  Date Value Ref Range Status  04/02/2022 39.6 34.0 - 46.6 % Final   MCHC  Date Value Ref Range Status  04/02/2022 31.6 31.5 - 35.7 g/dL Final  16/03/9603 54.0 30.0 - 36.0 g/dL Final   [facility]  Date Value Ref Range Status  04/02/2022 28.3 26.6 - 33.0 pg Final  09/03/2021 28.7 26.0 - 34.0 pg Final   MCV  Date Value Ref Range Status  04/02/2022 90 79 - 97 fL Final   No results found for: "PLTCOUNTKUC", "LABPLAT", "POCPLA" RDW  Date Value Ref Range Status  04/02/2022 13.6 11.7 - 15.4 % Final         Passed -  HBA1C is between 0 and 7.9 and within 180 days    HbA1c, POC (prediabetic range)  Date Value Ref Range Status  04/02/2022 6.2 5.7 - 6.4 % Final   HbA1c, POC (controlled diabetic range)  Date Value Ref Range Status  09/21/2022 6.1 0.0 - 7.0 % Final         Passed - Valid encounter within last 6 months    Recent Outpatient Visits           3 months ago Type 2 diabetes mellitus with obesity (HCC)   [place] [facility] Marcine Matar, MD   7 months ago Annual physical exam   [facility] Jonah Blue B, MD   9 months ago Type 2 diabetes mellitus with obesity [facility])   [place] Jonah Blue B, MD   1 year ago Closed fracture of multiple ribs of left side with routine healing, subsequent encounter   [facility] Jonah Blue B, MD   1 year ago Syncope, unspecified syncope type   [place] [facility] Marcine Matar, MD               sertraline (ZOLOFT) 100 MG tablet [Pharmacy Med Name: SERTRALINE HCL TABS 100MG ] 90 tablet 0    Sig: TAKE 1 TABLET DAILY     Psychiatry:  Antidepressants - SSRI - sertraline Passed - 01/07/2023  8:29 AM      Passed - AST in normal range and within 360 days    AST  Date Value Ref Range Status  04/02/2022 15 0 - 40 IU/L Final         Passed - ALT in normal range and within 360 days    ALT  Date Value Ref Range Status  04/02/2022 17 0 - 32 IU/L Final         Passed - Completed PHQ-2 or PHQ-9 in the last 360 days      Passed - Valid encounter within last 6 months    Recent Outpatient Visits  3 months ago Type 2 diabetes mellitus with obesity [facility])   [place] [facility] Marcine Matar, MD   7 months ago Annual physical exam   [place] Jonah Blue B, MD   9 months ago Type 2 diabetes  mellitus with obesity [facility])   [place] [facility] Jon[facility] Blue B, MD   1 year ago Closed fracture of multiple ribs of left side with routine healing, subsequent encounter   [facility] Marc[facility] Matar, MD   1 year ago Syncope, unspecified syncope type   [facility], MD

## 2023-01-29 ENCOUNTER — Other Ambulatory Visit: Payer: Self-pay | Admitting: Internal Medicine

## 2023-01-29 ENCOUNTER — Other Ambulatory Visit: Payer: Self-pay

## 2023-01-29 DIAGNOSIS — F32A Depression, unspecified: Secondary | ICD-10-CM

## 2023-01-29 DIAGNOSIS — E039 Hypothyroidism, unspecified: Secondary | ICD-10-CM

## 2023-01-29 DIAGNOSIS — E1169 Type 2 diabetes mellitus with other specified complication: Secondary | ICD-10-CM

## 2023-01-29 DIAGNOSIS — I10 Essential (primary) hypertension: Secondary | ICD-10-CM

## 2023-01-29 DIAGNOSIS — E1159 Type 2 diabetes mellitus with other circulatory complications: Secondary | ICD-10-CM

## 2023-01-29 NOTE — Telephone Encounter (Signed)
Medication Refill - Medication: atorvastatin (LIPITOR) 40 MG tablet [[id number]]    metFORMIN (GLUCOPHAGE-XR) 500 MG 24 hr tablet [[id number]]   etoprolol succinate (TOPROL-XL) 25 MG 24 hr tablet [[id number]] metoprolol succinate (TOPROL-XL) 25 MG 24 hr tablet    sertraline (ZOLOFT) 100 MG tablet [[id number]]   losartan (COZAAR) 25 MG tablet [[id number]]   levothyroxine (SYNTHROID) 50 MCG tablet [[id number]]     Has the patient contacted their pharmacy? Yes.   (Agent: If no, request that the patient contact the pharmacy for the refill. If patient does not wish to contact the pharmacy document the reason why and proceed with request.) (Agent: If yes, when and what did the pharmacy advise?)  Preferred Pharmacy (with phone number or street name): [facility] [[id number]]  Has the patient been seen for an appointment in the last year OR does the patient have an upcoming appointment? Yes.    Agent: Please be advised that RX refills may take up to 3 business days. We ask that you follow-up with your pharmacy.

## 2023-02-01 NOTE — Telephone Encounter (Signed)
Refills available at [facility], patient needs to contact pharmacy. Requests are too soon.  Requested Prescriptions  Pending Prescriptions Disp Refills   atorvastatin (LIPITOR) 40 MG tablet 90 tablet 1    Sig: Take 1 tablet (40 mg total) by mouth daily.     Cardiovascular:  Antilipid - Statins Failed - 01/29/2023 12:20 PM      Failed - Lipid Panel in normal range within the last 12 months    Cholesterol, Total  Date Value Ref Range Status  04/02/2022 182 100 - 199 mg/dL Final   LDL Chol Calc (NIH)  Date Value Ref Range Status  04/02/2022 120 (H) 0 - 99 mg/dL Final   HDL  Date Value Ref Range Status  04/02/2022 47 >39 mg/dL Final   Triglycerides  Date Value Ref Range Status  04/02/2022 80 0 - 149 mg/dL Final         Passed - Patient is not pregnant      Passed - Valid encounter within last 12 months    Recent Outpatient Visits           4 months ago Type 2 diabetes mellitus with obesity (HCC)   [facility] Marcine Matar, MD   8 months ago Annual physical exam   [place] Jonah Blue B, MD   10 months ago Type 2 diabetes mellitus with obesity [facility])   [place] [facility] Blue B, MD   1 year ago Closed fracture of multiple ribs of left side with routine healing, subsequent encounter   [facility] Blue B, MD   1 year ago Syncope, unspecified syncope type   [place] [facility] Jonah Blue B, MD               metoprolol succinate (TOPROL-XL) 25 MG 24 hr tablet 90 tablet 0    Sig: Take 1 tablet (25 mg total) by mouth daily.     Cardiovascular:  Beta Blockers Passed - 01/29/2023 12:20 PM      Passed - Last BP in normal range    BP Readings from Last 1 Encounters:  09/21/22 113/77         Passed - Last Heart Rate in normal range    Pulse Readings from  Last 1 Encounters:  09/21/22 70         Passed - Valid encounter within last 6 months    Recent Outpatient Visits           4 months ago Type 2 diabetes mellitus with obesity (HCC)   [facility] Marcine [facility], MD   8 months ago Annual physical exam   [facility] Jon[facility] Blue B, MD   10 months ago Type 2 diabetes mellitus with obesity [facility])   [place] [facility] [facility] Jonah Blue B, MD   1 year ago Closed fracture of multiple ribs of left side with routine healing, subsequent encounter   [facility]ine Matar, MD   1 year ago Syncope, unspecified syncope type   [facility] Jonah Blue B, MD               metFORMIN (GLUCOPHAGE-XR) 500 MG 24 hr tablet 180 tablet 0  Sig: Take 2 tablets (1,000 mg total) by mouth daily.     Endocrinology:  Diabetes - Biguanides Failed - 01/29/2023 12:20 PM      Failed - Cr in normal range and within 360 days    Creat  Date Value Ref Range Status  12/19/2015 1.10 (H) 0.50 - 1.05 mg/dL Final    Comment:      For patients > or = 62 years of age: The upper reference limit for Creatinine is approximately 13% higher for people identified as African-American.      Creatinine, Ser  Date Value Ref Range Status  04/02/2022 1.13 (H) 0.57 - 1.00 mg/dL Final   Creatinine, Urine  Date Value Ref Range Status  08/21/2016 101 20 - 320 mg/dL Final         Failed - eGFR in normal range and within 360 days    GFR, Est African American  Date Value Ref Range Status  12/19/2015 65 >=60 mL/min Final   GFR calc Af Amer  Date Value Ref Range Status  05/03/2020 60 >59 mL/min/1.73 Final    Comment:    **In accordance with recommendations from the NKF-ASN Task force,**   [facility] is in the process of updating its eGFR calculation to the   2021 CKD-EPI creatinine equation  that estimates kidney function   without a race variable.    GFR, Est Non African American  Date Value Ref Range Status  12/19/2015 57 (L) >=60 mL/min Final   GFR, Estimated  Date Value Ref Range Status  09/03/2021 53 (L) >60 mL/min Final    Comment:    (NOTE) Calculated using the CKD-EPI Creatinine Equation (2021)    eGFR  Date Value Ref Range Status  04/02/2022 55 (L) >59 mL/min/1.73 Final         Failed - B12 Level in normal range and within 720 days    No results found for: "VITAMINB12"       Failed - CBC within normal limits and completed in the last 12 months    WBC  Date Value Ref Range Status  04/02/2022 4.6 3.4 - 10.8 x10E3/uL Final  09/03/2021 5.9 4.0 - 10.5 K/uL Final   RBC  Date Value Ref Range Status  04/02/2022 4.41 3.77 - 5.28 x10E6/uL Final  09/03/2021 4.35 3.87 - 5.11 MIL/uL Final   Hemoglobin  Date Value Ref Range Status  04/02/2022 12.5 11.1 - 15.9 g/dL Final   Hematocrit  Date Value Ref Range Status  04/02/2022 39.6 34.0 - 46.6 % Final   MCHC  Date Value Ref Range Status  04/02/2022 31.6 31.5 - 35.7 g/dL Final  16/03/9603 54.0 30.0 - 36.0 g/dL Final   [facility]  Date Value Ref Range Status  04/02/2022 28.3 26.6 - 33.0 pg Final  09/03/2021 28.7 26.0 - 34.0 pg Final   MCV  Date Value Ref Range Status  04/02/2022 90 79 - 97 fL Final   No results found for: "PLTCOUNTKUC", "LABPLAT", "POCPLA" RDW  Date Value Ref Range Status  04/02/2022 13.6 11.7 - 15.4 % Final         Passed - HBA1C is between 0 and 7.9 and within 180 days    HbA1c, POC (prediabetic range)  Date Value Ref Range Status  04/02/2022 6.2 5.7 - 6.4 % Final   HbA1c, POC (controlled diabetic range)  Date Value Ref Range Status  09/21/2022 6.1 0.0 - 7.0 % Final         Passed - Valid encounter within  last 6 months    Recent Outpatient Visits           4 months ago Type 2 diabetes mellitus with obesity [facility])   [facility] Marcine Matar, MD   8 months ago Annual physical exam   [place] [facility] Jonah Blue B, MD   10 months ago Type 2 diabetes mellitus with obesity [facility])   [place] [facility] Jonah Blue B, MD   1 year ago Closed fracture of multiple ribs of left side with routine healing, subsequent encounter   [place] Marcine Matar, MD   1 year ago Syncope, unspecified syncope type   [facility] Jonah Blue B, MD               sertraline (ZOLOFT) 100 MG tablet 90 tablet 0    Sig: Take 1 tablet (100 mg total) by mouth daily.     Psychiatry:  Antidepressants - SSRI - sertraline Passed - 01/29/2023 12:20 PM      Passed - AST in normal range and within 360 days    AST  Date Value Ref Range Status  04/02/2022 15 0 - 40 IU/L Final         Passed - ALT in normal range and within 360 days    ALT  Date Value Ref Range Status  04/02/2022 17 0 - 32 IU/L Final         Passed - Completed PHQ-2 or PHQ-9 in the last 360 days      Passed - Valid encounter within last 6 months    Recent Outpatient Visits           4 months ago Type 2 diabetes mellitus with obesity [facility])   [place] [facility] Marcine Matar, MD   8 months ago Annual physical exam   [place] Jonah Blue B, MD   10 months ago Type 2 diabetes mellitus with obesity [facility])   [place] [facility] Jonah Blue B, MD   1 year ago Closed fracture of multiple ribs of left side with routine healing, subsequent encounter   [facility] Mat[facility], MD   1 year ago Syncope, unspecified syncope type   [facility] Jonah Blue B, MD               losartan (COZAAR) 25 MG tablet 90 tablet 0    Sig: Take 1 tablet (25 mg  total) by mouth daily.     Cardiovascular:  Angiotensin Receptor Blockers Failed - 01/29/2023 12:20 PM      Failed - Cr in normal range and within 180 days    Creat  Date Value Ref Range Status  12/19/2015 1.10 (H) 0.50 - 1.05 mg/dL Final    Comment:      For patients > or = 62 years of age: The upper reference limit for Creatinine is approximately 13% higher for people identified as African-American.      Creatinine, Ser  Date Value Ref Range Status  04/02/2022 1.13 (H) 0.57 - 1.00 mg/dL Final   Creatinine, Urine  Date Value Ref Range Status  08/21/2016 101 20 - 320 mg/dL Final         Failed - K  in normal range and within 180 days    Potassium  Date Value Ref Range Status  04/02/2022 5.1 3.5 - 5.2 mmol/L Final         Passed - Patient is not pregnant      Passed - Last BP in normal range    BP Readings from Last 1 Encounters:  09/21/22 113/77         Passed - Valid encounter within last 6 months    Recent Outpatient Visits           4 months ago Type 2 diabetes mellitus with obesity [facility])   [place] [facility] Marcine Matar, MD   8 months ago Annual physical exam   [place] [facility] Jonah Blue B, MD   10 months ago Type 2 diabetes mellitus with obesity [facility])   [place] [facility] Jonah Blue B, MD   1 year ago Closed fracture of multiple ribs of left side with routine healing, subsequent encounter   [place] [facility] Marcine Matar, MD   1 year ago Syncope, unspecified syncope type   [facility]ine Matar, MD               levothyroxine (SYNTHROID) 50 MCG tablet 90 tablet 1    Sig: TAKE 1 TABLET DAILY (MUST KEEP UPCOMING OFFICE VISIT FOR REFILLS)     Endocrinology:  Hypothyroid Agents Passed - 01/29/2023 12:20 PM      Passed - TSH in normal range and within 360 days    TSH  Date  Value Ref Range Status  09/21/2022 2.590 0.450 - 4.500 uIU/mL Final         Passed - Valid encounter within last 12 months    Recent Outpatient Visits           4 months ago Type 2 diabetes mellitus with obesity [facility])   [place] [facility] Marcine Matar, MD   8 months ago Annual physical exam   [place] Jonah Blue B, MD   10 months ago Type 2 diabetes mellitus with obesity [facility])    [facility] Jonah Blue B, MD   1 year ago Closed fracture of multiple ribs of left side with routine healing, subsequent encounter   [facility] Marcine Matar, MD   1 year ago Syncope, unspecified syncope type   [facility] Marcine Matar, MD

## 2023-02-13 ENCOUNTER — Encounter (INDEPENDENT_AMBULATORY_CARE_PROVIDER_SITE_OTHER): Payer: Self-pay

## 2023-03-10 ENCOUNTER — Other Ambulatory Visit: Payer: Self-pay | Admitting: Internal Medicine

## 2023-03-10 DIAGNOSIS — E1159 Type 2 diabetes mellitus with other circulatory complications: Secondary | ICD-10-CM

## 2023-03-11 ENCOUNTER — Telehealth: Payer: Self-pay

## 2023-03-11 NOTE — Telephone Encounter (Signed)
Per Duke Salvia in Authorization department:Good morning! Just got off the phone with patient insurance. Her plan does not have any advance testing. MRI/CT/PET so therefore not covered.    LVM for patient to call office regarding MRI scheduled for tomorrow. Per Warner Mccreedy NP, scan are not covered and will have to pay out of pocket. How would patient like to proceed?

## 2023-03-11 NOTE — Telephone Encounter (Signed)
Options are that we can get an ultrasound instead which will be much cheaper than an MRI.  Ultimately, the patient had voiced wanting to avoid surgery if at all possible.  If she would like to move forward with scheduling surgery, that is another option.  That would definitively answer the question and mean we do not have to continue following this in the future.

## 2023-03-11 NOTE — Telephone Encounter (Signed)
Pt states according to her current plan the scans are not covered, she spoke with billing and out of pocket cost is $7900. She had to take early retirement and can not afford that at this time. She would like to know advice from Dr. Pricilla Holm on moving forward.

## 2023-03-11 NOTE — Telephone Encounter (Signed)
Spoke with patient in regards to her medical insurance not covering MRI/CT/PET. Patient was given billing's number at [phone] to see how much her out of pocket expense would be. Also advised patient to call her insurance company and ask about what is covered. Pt verbalized understanding and states she will call billing today and her insurance company.

## 2023-03-12 ENCOUNTER — Encounter (HOSPITAL_COMMUNITY): Payer: Self-pay

## 2023-03-12 ENCOUNTER — Ambulatory Visit (HOSPITAL_COMMUNITY): Payer: PRIVATE HEALTH INSURANCE

## 2023-03-12 ENCOUNTER — Ambulatory Visit (HOSPITAL_COMMUNITY): Admission: RE | Admit: 2023-03-12 | Payer: PRIVATE HEALTH INSURANCE | Source: Ambulatory Visit

## 2023-03-12 NOTE — Telephone Encounter (Signed)
LVM for patient to call office regarding previous message from Dr. Pricilla Holm

## 2023-03-12 NOTE — Telephone Encounter (Signed)
Please check with Melissa for CPT codes. Surgery would be Robotic BSO, possible total hysterectomy, possible staging.

## 2023-03-12 NOTE — Telephone Encounter (Signed)
I spoke to patient, she states everything from this day forward has to do with pricing. She asks if we can schedule both the ultrasound and surgery (give her name of surgery/codes). She will call billing and get an idea of prices then let us know?

## 2023-03-16 NOTE — Telephone Encounter (Signed)
Pt is aware of name of surgery and CPT codes were given, she will call our office with decision moving forward with surgery or not.

## 2023-03-17 ENCOUNTER — Telehealth: Payer: Self-pay

## 2023-03-17 DIAGNOSIS — N83209 Unspecified ovarian cyst, unspecified side: Secondary | ICD-10-CM

## 2023-03-17 NOTE — Telephone Encounter (Addendum)
Heather Bailey called back stating billing told her she would need a code for the pelvic ultrasound before they could give her an estimated amount. They told her it was usually a code starting with 7). Please advise

## 2023-03-17 NOTE — Telephone Encounter (Signed)
Heather Bailey called office stating she called billing and they told her the type of surgery Dr. Pricilla Holm wants to do would be $19,000+ (self pay price)  She will ask about the ultrasound price and call us back.

## 2023-03-18 NOTE — Telephone Encounter (Signed)
Pt is aware of ultrasound CPT codes are as follows and reported by Warner Mccreedy NP  Transvaginal: [id number] Pelvic: [phone]

## 2023-03-18 NOTE — Telephone Encounter (Signed)
Pt is scheduled for an ultrasound on 03/26/23 @ 11:00am.

## 2023-03-18 NOTE — Telephone Encounter (Signed)
Pt called stating billing advised her ultrasound would be anywhere from $700-$800. They will know more of a price once order has been placed and has been scheduled. She wants to go ahead and schedule.   Please place order. Thanks

## 2023-03-22 ENCOUNTER — Other Ambulatory Visit: Payer: Self-pay | Admitting: Internal Medicine

## 2023-03-22 DIAGNOSIS — E039 Hypothyroidism, unspecified: Secondary | ICD-10-CM

## 2023-03-22 DIAGNOSIS — E1169 Type 2 diabetes mellitus with other specified complication: Secondary | ICD-10-CM

## 2023-03-22 DIAGNOSIS — E785 Hyperlipidemia, unspecified: Secondary | ICD-10-CM

## 2023-03-26 ENCOUNTER — Ambulatory Visit (HOSPITAL_COMMUNITY)
Admission: RE | Admit: 2023-03-26 | Discharge: 2023-03-26 | Disposition: A | Discharge status: Home / Self Care | Payer: Self-pay | Source: Ambulatory Visit | Attending: Gynecologic Oncology | Admitting: Gynecologic Oncology

## 2023-03-26 DIAGNOSIS — N83202 Unspecified ovarian cyst, left side: Secondary | ICD-10-CM | POA: Diagnosis not present

## 2023-03-26 DIAGNOSIS — D252 Subserosal leiomyoma of uterus: Secondary | ICD-10-CM | POA: Diagnosis not present

## 2023-03-26 DIAGNOSIS — N838 Other noninflammatory disorders of ovary, fallopian tube and broad ligament: Secondary | ICD-10-CM | POA: Diagnosis not present

## 2023-03-26 DIAGNOSIS — N83209 Unspecified ovarian cyst, unspecified side: Secondary | ICD-10-CM | POA: Insufficient documentation

## 2023-03-26 DIAGNOSIS — N83201 Unspecified ovarian cyst, right side: Secondary | ICD-10-CM | POA: Diagnosis not present

## 2023-04-05 ENCOUNTER — Other Ambulatory Visit: Payer: Self-pay | Admitting: Internal Medicine

## 2023-04-05 DIAGNOSIS — E1169 Type 2 diabetes mellitus with other specified complication: Secondary | ICD-10-CM

## 2023-04-05 DIAGNOSIS — F32A Depression, unspecified: Secondary | ICD-10-CM

## 2023-04-05 DIAGNOSIS — I10 Essential (primary) hypertension: Secondary | ICD-10-CM

## 2023-04-06 NOTE — Telephone Encounter (Signed)
Requested medications are due for refill today.  yes  Requested medications are on the active medications list.  yes  Last refill. 01/07/2023 #90 0 rf for all 3  Future visit scheduled.   no  Notes to clinic.  Labs are expired. Pt needs an OV.    Requested Prescriptions  Pending Prescriptions Disp Refills   sertraline (ZOLOFT) 100 MG tablet [Pharmacy Med Name: SERTRALINE HCL TABS 100MG ] 90 tablet 3    Sig: TAKE 1 TABLET DAILY     Psychiatry:  Antidepressants - SSRI - sertraline Failed - 04/05/2023 12:18 AM      Failed - AST in normal range and within 360 days    AST  Date Value Ref Range Status  04/02/2022 15 0 - 40 IU/L Final         Failed - ALT in normal range and within 360 days    ALT  Date Value Ref Range Status  04/02/2022 17 0 - 32 IU/L Final         Failed - Valid encounter within last 6 months    Recent Outpatient Visits           6 months ago Type 2 diabetes mellitus with obesity (HCC)   [place] [facility] Marcine Matar, MD   10 months ago Annual physical exam   [place] [facility] Jonah Blue B, MD   1 year ago Type 2 diabetes mellitus with obesity [facility])   [place] Jon[facility] Blue B, MD   1 year ago Closed fracture of multiple ribs of left side with routine healing, subsequent encounter   [place] [facility] Marcine Matar, MD   1 year ago Syncope, unspecified syncope type   [facility]ine Matar, MD       Future Appointments             In 7 months Terri Piedra, DO [facility]            Passed - Completed PHQ-2 or PHQ-9 in the last 360 days       metFORMIN (GLUCOPHAGE-XR) 500 MG 24 hr tablet [Pharmacy Med Name: METFORMIN HCL ER TABS 500MG ] 180 tablet 3    Sig: TAKE 2 TABLETS DAILY     Endocrinology:  Diabetes - Biguanides Failed - 04/05/2023 12:18  AM      Failed - Cr in normal range and within 360 days    Creat  Date Value Ref Range Status  12/19/2015 1.10 (H) 0.50 - 1.05 mg/dL Final    Comment:      For patients > or = 62 years of age: The upper reference limit for Creatinine is approximately 13% higher for people identified as African-American.      Creatinine, Ser  Date Value Ref Range Status  04/02/2022 1.13 (H) 0.57 - 1.00 mg/dL Final   Creatinine, Urine  Date Value Ref Range Status  08/21/2016 101 20 - 320 mg/dL Final         Failed - HBA1C is between 0 and 7.9 and within 180 days    HbA1c, POC (prediabetic range)  Date Value Ref Range Status  04/02/2022 6.2 5.7 - 6.4 % Final   HbA1c, POC (controlled diabetic range)  Date Value Ref Range Status  09/21/2022 6.1 0.0 - 7.0 % Final         Failed -  eGFR in normal range and within 360 days    GFR, Est African American  Date Value Ref Range Status  12/19/2015 65 >=60 mL/min Final   GFR calc Af Amer  Date Value Ref Range Status  05/03/2020 60 >59 mL/min/1.73 Final    Comment:    **In accordance with recommendations from the NKF-ASN Task force,**   [facility] is in the process of updating its eGFR calculation to the   2021 CKD-EPI creatinine equation that estimates kidney function   without a race variable.    GFR, Est Non African American  Date Value Ref Range Status  12/19/2015 57 (L) >=60 mL/min Final   GFR, Estimated  Date Value Ref Range Status  09/03/2021 53 (L) >60 mL/min Final    Comment:    (NOTE) Calculated using the CKD-EPI Creatinine Equation (2021)    eGFR  Date Value Ref Range Status  04/02/2022 55 (L) >59 mL/min/1.73 Final         Failed - B12 Level in normal range and within 720 days    No results found for: "VITAMINB12"       Failed - Valid encounter within last 6 months    Recent Outpatient Visits           6 months ago Type 2 diabetes mellitus with obesity (HCC)   [place] Jonah Blue B, MD   10 months ago Annual physical exam   [place] [facility] Jonah Blue B, MD   1 year ago Type 2 diabetes mellitus with obesity [facility])   [place] [facility] Jonah Blue B, MD   1 year ago Closed fracture of multiple ribs of left side with routine healing, subsequent encounter   [place] [facility] Jonah Blue B, MD   1 year ago Syncope, unspecified syncope type   [facility] Marcine Matar, MD       Future Appointments             In 7 months Terri Piedra, DO [facility]            Failed - CBC within normal limits and completed in the last 12 months    WBC  Date Value Ref Range Status  04/02/2022 4.6 3.4 - 10.8 x10E3/uL Final  09/03/2021 5.9 4.0 - 10.5 K/uL Final   RBC  Date Value Ref Range Status  04/02/2022 4.41 3.77 - 5.28 x10E6/uL Final  09/03/2021 4.35 3.87 - 5.11 MIL/uL Final   Hemoglobin  Date Value Ref Range Status  04/02/2022 12.5 11.1 - 15.9 g/dL Final   Hematocrit  Date Value Ref Range Status  04/02/2022 39.6 34.0 - 46.6 % Final   MCHC  Date Value Ref Range Status  04/02/2022 31.6 31.5 - 35.7 g/dL Final  16/03/9603 54.0 30.0 - 36.0 g/dL Final   [facility]  Date Value Ref Range Status  04/02/2022 28.3 26.6 - 33.0 pg Final  09/03/2021 28.7 26.0 - 34.0 pg Final   MCV  Date Value Ref Range Status  04/02/2022 90 79 - 97 fL Final   No results found for: "PLTCOUNTKUC", "LABPLAT", "POCPLA" RDW  Date Value Ref Range Status  04/02/2022 13.6 11.7 - 15.4 % Final          metoprolol succinate (TOPROL-XL) 25 MG 24 hr tablet [Pharmacy Med Name: METOPROLOL SUCCINATE ER TABS 25MG ] 90 tablet 3  Sig: TAKE 1 TABLET DAILY     Cardiovascular:  Beta Blockers Failed - 04/05/2023 12:18 AM      Failed - Valid encounter within last 6 months    Recent Outpatient Visits           6 months ago Type 2  diabetes mellitus with [facility])   [place] [facility] Marcine Matar, MD   10 months ago Annual physical exam   [place] [facility] Jonah Blue B, MD   1 year ago Type 2 diabetes mellitus with obesity [facility])   [place] [facility] Jonah Blue B, MD   1 year ago Closed fracture of multiple ribs of left side with routine healing, subsequent encounter    [facility]ar, MD   1 year ago Syncope, unspecified syncope type   [place] [facility] Marcine Matar, MD       Future Appointments             In 7 months Terri Piedra, DO [facility]            Passed - Last BP in normal range    BP Readings from Last 1 Encounters:  09/21/22 113/77         Passed - Last Heart Rate in normal range    Pulse Readings from Last 1 Encounters:  09/21/22 70

## 2023-04-09 ENCOUNTER — Telehealth: Payer: Self-pay | Admitting: Gynecologic Oncology

## 2023-04-09 NOTE — Telephone Encounter (Signed)
I called the patient to discuss recent ultrasound exam.  No answer.  Left voicemail requesting callback.  Eugene Garnet MD Gynecologic Oncology

## 2023-04-15 ENCOUNTER — Ambulatory Visit: Payer: Self-pay | Attending: Physician Assistant | Admitting: Physician Assistant

## 2023-04-15 ENCOUNTER — Other Ambulatory Visit: Payer: Self-pay

## 2023-04-15 VITALS — BP 135/88 | HR 69 | Wt 201.2 lb

## 2023-04-15 DIAGNOSIS — Z23 Encounter for immunization: Secondary | ICD-10-CM

## 2023-04-15 DIAGNOSIS — E669 Obesity, unspecified: Secondary | ICD-10-CM

## 2023-04-15 DIAGNOSIS — E1169 Type 2 diabetes mellitus with other specified complication: Secondary | ICD-10-CM

## 2023-04-15 DIAGNOSIS — N83201 Unspecified ovarian cyst, right side: Secondary | ICD-10-CM

## 2023-04-15 DIAGNOSIS — E785 Hyperlipidemia, unspecified: Secondary | ICD-10-CM

## 2023-04-15 DIAGNOSIS — Z7984 Long term (current) use of oral hypoglycemic drugs: Secondary | ICD-10-CM

## 2023-04-15 DIAGNOSIS — E039 Hypothyroidism, unspecified: Secondary | ICD-10-CM

## 2023-04-15 DIAGNOSIS — N83202 Unspecified ovarian cyst, left side: Secondary | ICD-10-CM

## 2023-04-15 DIAGNOSIS — F419 Anxiety disorder, unspecified: Secondary | ICD-10-CM

## 2023-04-15 DIAGNOSIS — I152 Hypertension secondary to endocrine disorders: Secondary | ICD-10-CM

## 2023-04-15 DIAGNOSIS — F32A Depression, unspecified: Secondary | ICD-10-CM

## 2023-04-15 DIAGNOSIS — E1159 Type 2 diabetes mellitus with other circulatory complications: Secondary | ICD-10-CM

## 2023-04-15 MED ORDER — LOSARTAN POTASSIUM 25 MG PO TABS
25.0000 mg | ORAL_TABLET | Freq: Every day | ORAL | 1 refills | Status: DC
  Filled 2023-04-15: qty 90, 90d supply, fill #0
  Filled 2023-08-08: qty 90, 90d supply, fill #1

## 2023-04-15 MED ORDER — METOPROLOL SUCCINATE ER 25 MG PO TB24
25.0000 mg | ORAL_TABLET | Freq: Every day | ORAL | 1 refills | Status: DC
  Filled 2023-04-15: qty 90, 90d supply, fill #0
  Filled 2023-07-16: qty 90, 90d supply, fill #1

## 2023-04-15 MED ORDER — LEVOTHYROXINE SODIUM 50 MCG PO TABS
50.0000 ug | ORAL_TABLET | Freq: Every day | ORAL | 1 refills | Status: DC
  Filled 2023-04-15: qty 90, 90d supply, fill #0
  Filled 2023-09-12: qty 90, 90d supply, fill #1

## 2023-04-15 MED ORDER — SERTRALINE HCL 100 MG PO TABS
100.0000 mg | ORAL_TABLET | Freq: Every day | ORAL | 1 refills | Status: DC
  Filled 2023-04-15: qty 90, 90d supply, fill #0
  Filled 2023-07-16: qty 90, 90d supply, fill #1

## 2023-04-15 MED ORDER — ATORVASTATIN CALCIUM 40 MG PO TABS
40.0000 mg | ORAL_TABLET | Freq: Every day | ORAL | 0 refills | Status: DC
  Filled 2023-04-15: qty 30, 30d supply, fill #0

## 2023-04-15 MED ORDER — METFORMIN HCL ER 500 MG PO TB24
1000.0000 mg | ORAL_TABLET | Freq: Every day | ORAL | 1 refills | Status: DC
  Filled 2023-04-15: qty 180, 90d supply, fill #0
  Filled 2023-07-16: qty 180, 90d supply, fill #1

## 2023-04-15 NOTE — Progress Notes (Signed)
Patient ID: Heather Bailey, female   DOB: [birth year], 62 y.o.   MRN: [id number]   Heather Bailey, is a 62 y.o. female  YQM:[id number]  [id number]  DOB - [birth year]  No chief complaint on file.      Subjective:   Heather Bailey is a 62 y.o. female here today for med RF and to go over ultrasound results from pelvis ultrasound.  She denies any new issues or concerns.  She  has been discussing the possibility of B oopherectomy/total hysterectomy.  No weight loss/fevers.  No change in appetite or BMs.    She does want to get her flu shot today.  No problems updated.  ALLERGIES: Allergies  Allergen Reactions   Zithromax [Azithromycin Dihydrate]     PAST MEDICAL HISTORY: Past Medical History:  Diagnosis Date   Anemia Dx 2003   Arthritis Dx 1999   DVT (deep venous thrombosis) (HCC) 2007   got Lupron to shrink fibroids, developed DVT after (blood thinner for 6 months)   Fibroids    Gestational diabetes    Hyperlipidemia Dx 2000   Hypertension Dx 2000   Hypertensive retinopathy    Thyroid disease Dx 2005    MEDICATIONS AT HOME: Prior to Admission medications   Medication Sig Start Date End Date Taking? Authorizing Provider  acetaminophen-codeine (TYLENOL #3) 300-30 MG tablet Take 2 tablets by mouth every 8 (eight) hours as needed for moderate pain. 10/17/21   Marcine Matar, MD  atorvastatin (LIPITOR) 40 MG tablet Take 1 tablet (40 mg total) by mouth daily. 04/15/23   Anders Simmonds, PA-C  cycloSPORINE (RESTASIS) 0.05 % ophthalmic emulsion Instill 1 drop in both eyes twice a day 10/31/20     diclofenac sodium (VOLTAREN) 1 % GEL Apply 2 g topically 4 (four) times daily. 03/03/19   Fulp, Cammie, MD  glucose blood (TRUE METRIX BLOOD GLUCOSE TEST) test strip Use as instructed to check blood sugar up to 3 times daily. 08/17/18   Marcine Matar, MD  levothyroxine (SYNTHROID) 50 MCG tablet TAKE 1 TABLET DAILY. Please make appt with Dr. Laural Benes. 04/15/23   Anders Simmonds, PA-C   Lidocaine (HM LIDOCAINE PATCH) 4 % PTCH Apply 1 patch topically every 12 (twelve) hours as needed. 09/03/21   Haskel Schroeder, PA-C  losartan (COZAAR) 25 MG tablet Take 1 tablet (25 mg total) by mouth daily. Must have office visit for refills 04/15/23   Bary Richard  metFORMIN (GLUCOPHAGE-XR) 500 MG 24 hr tablet Take 2 tablets (1,000 mg total) by mouth daily. 04/15/23   Anders Simmonds, PA-C  metoprolol succinate (TOPROL-XL) 25 MG 24 hr tablet Take 1 tablet (25 mg total) by mouth daily. 04/15/23   Anders Simmonds, PA-C  Multiple Vitamin (MULTIVITAMIN ADULT PO) Take by mouth.    [provider]  sertraline (ZOLOFT) 100 MG tablet Take 1 tablet (100 mg total) by mouth daily. 04/15/23   Anders Simmonds, PA-C  TRUEPLUS LANCETS 28G MISC Use to check blood sugar up to 3 times daily. 08/17/18   Marcine Matar, MD    ROS: Neg HEENT Neg resp Neg cardiac Neg GI Neg GU Neg MS Neg psych Neg neuro  Objective:   Vitals:   04/15/23 1340  BP: 135/88  Pulse: 69  SpO2: 97%  Weight: 201 lb 3.2 oz (91.3 kg)   Exam General appearance : Awake, alert, not in any distress. Speech Clear. Not toxic looking HEENT: Atraumatic and Normocephalic Neck: Supple, no JVD. No  cervical lymphadenopathy.  Chest: Good air entry bilaterally, CTAB.  No rales/rhonchi/wheezing CVS: S1 S2 regular, no murmurs.  Extremities: B/L Lower Ext shows no edema, both legs are warm to touch Neurology: Awake alert, and oriented X 3, CN II-XII intact, Non focal Skin: No Rash  Data Review Lab Results  Component Value Date   HGBA1C 6.1 09/21/2022   HGBA1C 6.2 04/02/2022   HGBA1C 6.1 (H) 10/17/2021    Assessment & Plan   1. Cysts of both ovaries She will call gyn/onc today and I will forward labs - CA 125  2. Type 2 diabetes mellitus with obesity (HCC) Last A1c=6.1 - Comprehensive metabolic panel - Hemoglobin A1c - atorvastatin (LIPITOR) 40 MG tablet; Take 1 tablet (40 mg total) by mouth  daily.  Dispense: 30 tablet; Refill: 0 - metFORMIN (GLUCOPHAGE-XR) 500 MG 24 hr tablet; Take 2 tablets (1,000 mg total) by mouth daily.  Dispense: 180 tablet; Refill: 1  3. Hyperlipidemia associated with type 2 diabetes mellitus (HCC) - atorvastatin (LIPITOR) 40 MG tablet; Take 1 tablet (40 mg total) by mouth daily.  Dispense: 30 tablet; Refill: 0 - Lipid Panel  4. Acquired hypothyroidism Stable when last checked - levothyroxine (SYNTHROID) 50 MCG tablet; TAKE 1 TABLET DAILY. Please make appt with Dr. Laural Benes.  Dispense: 90 tablet; Refill: 1 - Thyroid Panel With TSH  5. Hypertension associated with diabetes (HCC) Didn't take losartan today - Comprehensive metabolic panel - losartan (COZAAR) 25 MG tablet; Take 1 tablet (25 mg total) by mouth daily. Must have office visit for refills  Dispense: 90 tablet; Refill: 1 - metoprolol succinate (TOPROL-XL) 25 MG 24 hr tablet; Take 1 tablet (25 mg total) by mouth daily.  Dispense: 90 tablet; Refill: 1  6. Anxiety and depression stable - sertraline (ZOLOFT) 100 MG tablet; Take 1 tablet (100 mg total) by mouth daily.  Dispense: 90 tablet; Refill: 1  7. Long term current use of oral hypoglycemic drug - Hemoglobin A1c - metFORMIN (GLUCOPHAGE-XR) 500 MG 24 hr tablet; Take 2 tablets (1,000 mg total) by mouth daily.  Dispense: 180 tablet; Refill: 1  8. Needs flu shot Flu shot given     Return in about 6 months (around 10/13/2023) for PCP for chronic conditions-Dr Laural Benes.  The patient was given clear instructions to go to ER or return to medical center if symptoms don't improve, worsen or new problems develop. The patient verbalized understanding. The patient was told to call to get lab results if they haven't heard anything in the next week.      Georgian Co, PA-C [facility] [place], [place] [phone]   04/15/2023, 1:53 PM

## 2023-04-15 NOTE — Patient Instructions (Addendum)
Please call and follow up with your oncology/gynecology(Dr Pricilla Holm regarding your recent ultrasound and next steps.  I will forward your blood work to them.

## 2023-04-16 ENCOUNTER — Telehealth: Payer: Self-pay | Admitting: *Deleted

## 2023-04-16 ENCOUNTER — Other Ambulatory Visit: Payer: Self-pay

## 2023-04-16 ENCOUNTER — Other Ambulatory Visit: Payer: Self-pay | Admitting: Medical Genetics

## 2023-04-16 DIAGNOSIS — Z006 Encounter for examination for normal comparison and control in clinical research program: Secondary | ICD-10-CM

## 2023-04-16 LAB — COMPREHENSIVE METABOLIC PANEL
ALT: 17 [IU]/L (ref 0–32)
AST: 20 [IU]/L (ref 0–40)
Albumin: 4.5 g/dL (ref 3.9–4.9)
Alkaline Phosphatase: 92 [IU]/L (ref 44–121)
BUN/Creatinine Ratio: 11 — ABNORMAL LOW (ref 12–28)
BUN: 13 mg/dL (ref 8–27)
Bilirubin Total: 0.3 mg/dL (ref 0.0–1.2)
CO2: 26 mmol/L (ref 20–29)
Calcium: 10.1 mg/dL (ref 8.7–10.3)
Chloride: 104 mmol/L (ref 96–106)
Creatinine, Ser: 1.17 mg/dL — ABNORMAL HIGH (ref 0.57–1.00)
Globulin, Total: 2.9 g/dL (ref 1.5–4.5)
Glucose: 97 mg/dL (ref 70–99)
Potassium: 4.8 mmol/L (ref 3.5–5.2)
Sodium: 141 mmol/L (ref 134–144)
Total Protein: 7.4 g/dL (ref 6.0–8.5)
eGFR: 53 mL/min/{1.73_m2} — ABNORMAL LOW (ref >59)

## 2023-04-16 LAB — HEMOGLOBIN A1C
Est. average glucose Bld gHb Est-mCnc: 140 mg/dL
Hgb A1c MFr Bld: 6.5 % — ABNORMAL HIGH (ref 4.8–5.6)

## 2023-04-16 LAB — THYROID PANEL WITH TSH
Free Thyroxine Index: 1.9 (ref 1.2–4.9)
T3 Uptake Ratio: 24 % (ref 24–39)
T4, Total: 7.9 ug/dL (ref 4.5–12.0)
TSH: 3.18 u[IU]/mL (ref 0.450–4.500)

## 2023-04-16 LAB — LIPID PANEL
Chol/HDL Ratio: 3.8 ratio (ref 0.0–4.4)
Cholesterol, Total: 167 mg/dL (ref 100–199)
HDL: 44 mg/dL (ref >39)
LDL Chol Calc (NIH): 103 mg/dL — ABNORMAL HIGH (ref 0–99)
Triglycerides: 112 mg/dL (ref 0–149)
VLDL Cholesterol Cal: 20 mg/dL (ref 5–40)

## 2023-04-16 LAB — CA 125: Cancer Antigen (CA) 125: 32.3 U/mL (ref 0.0–38.1)

## 2023-04-16 NOTE — Telephone Encounter (Signed)
Spoke with Heather Bailey this morning who called the office stating she missed a call from Dr. Pricilla Holm on 10/25 in regards to her ultrasound results. Pt is calling back to speak with provider. Pt advised that her message would be relayed. Pt's best call back number is #[phone].

## 2023-04-16 NOTE — Telephone Encounter (Signed)
I called the patient.  We discussed recent ultrasound images.  Also discussed her CA125 drawn by PCP that was normal.  Patient remains asymptomatic.  Hoping to have insurance after the first of the year.  We discussed possible options including procedure with surgery versus plan for follow-up imaging.  While there has not been any growth of these pelvic masses, they have some features that raise concern for possible borderline or malignant process.  Patient would like to check back in after the first of the year to consider surgery depending on insurance status versus follow-up imaging.  She will be scheduled for a phone visit on 1/10 at 16:15.

## 2023-04-19 ENCOUNTER — Telehealth: Payer: Self-pay

## 2023-04-19 NOTE — Telephone Encounter (Signed)
Pt was called and is aware of results, DOB was confirmed.   Patient states that she spoke with Dr.Tucker, They are waiting to for next year about surgery

## 2023-04-19 NOTE — Telephone Encounter (Signed)
-----   Message from Georgian Co sent at 04/16/2023 11:06 AM EDT ----- Please call patient.  Your kidney function is impaired.  I recommend increasing your water intake.  Your A1C is controlled at 6.5.  Continue working to eliminate sugars from your diet.  The ovarian tumor marker is negative which is reassuring, but please follow up with Dr Pricilla Holm for next steps regarding your ovarian lesions seen on ultrasound.  Thyroid is normal.  Liver function and cholesterol are normal.  (Labs being copied to Dr Pricilla Holm)  thanks, Georgian Co, PA-C

## 2023-04-23 IMAGING — CR DG LUMBAR SPINE COMPLETE 4+V
5 series · 5 of 5 positions shown · non-contrast
Comparison: Lumbar spine radiograph 03/27/2021

CLINICAL DATA: pain after fall

EXAM:
LUMBAR SPINE - COMPLETE 4+ VIEW

[t lumbar spine obl (1 of 2)]
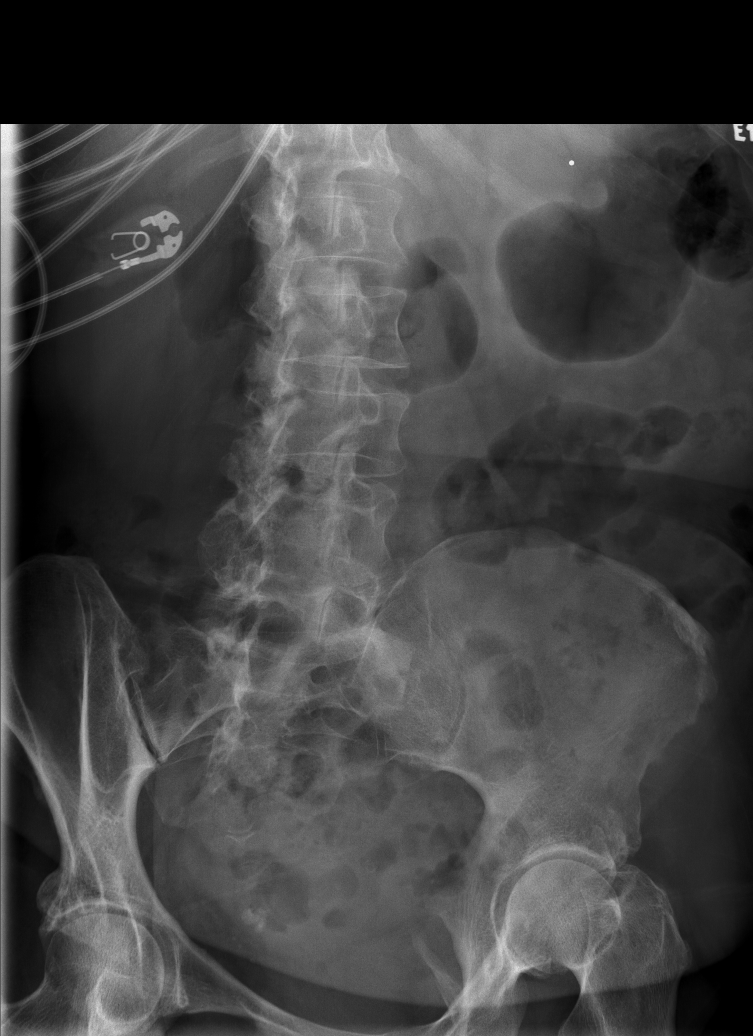

[t lumbar spine ap]
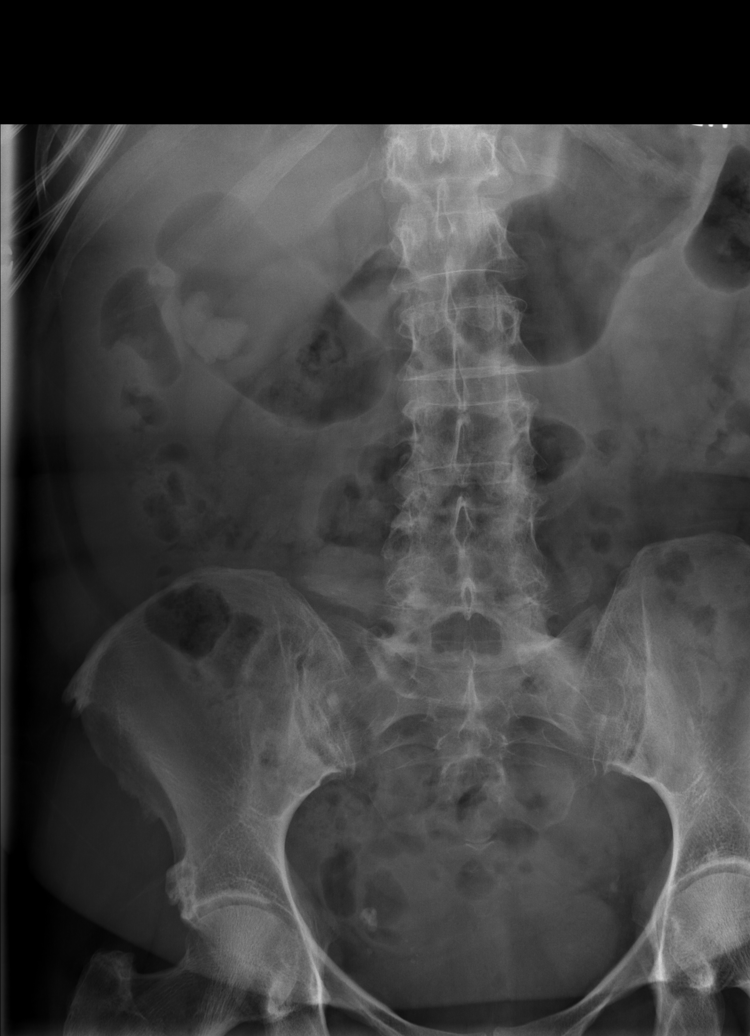

[t lumbar spine obl (2 of 2)]
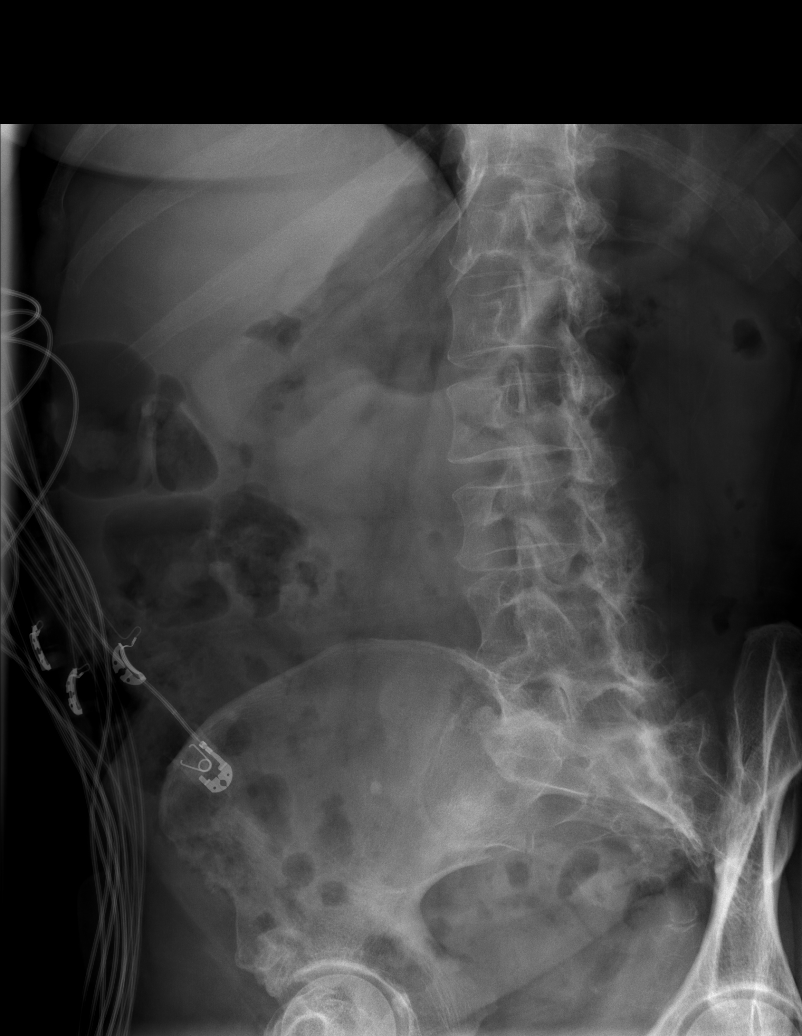

[t lumbar spine lat]
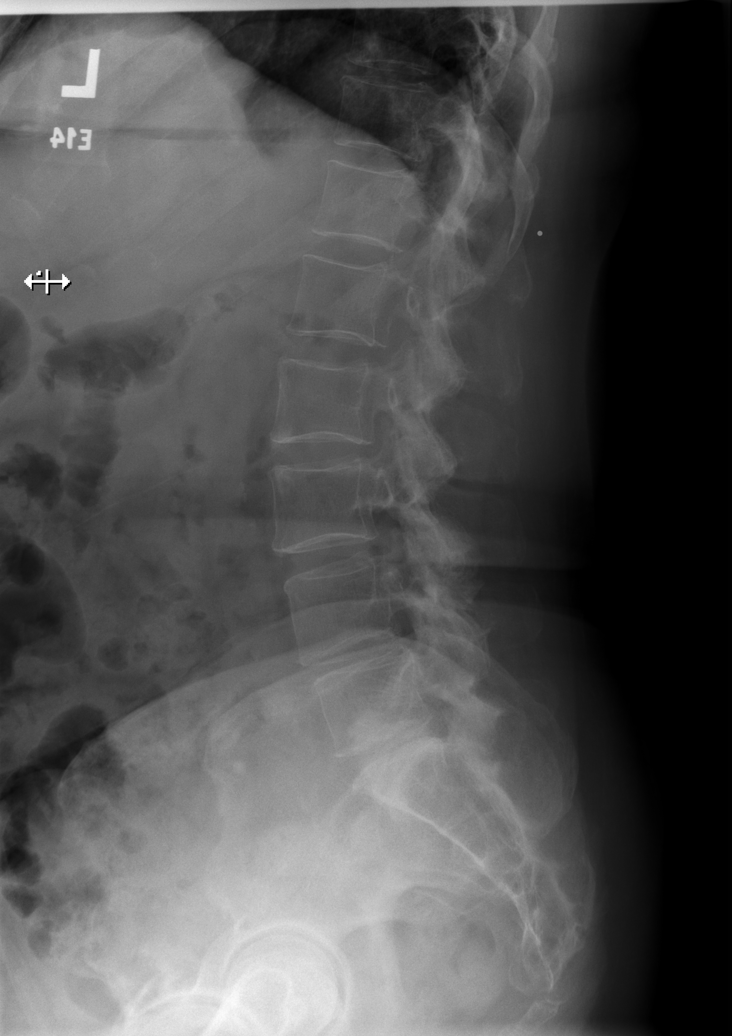

[t lumbar l-5 s-1 spot]
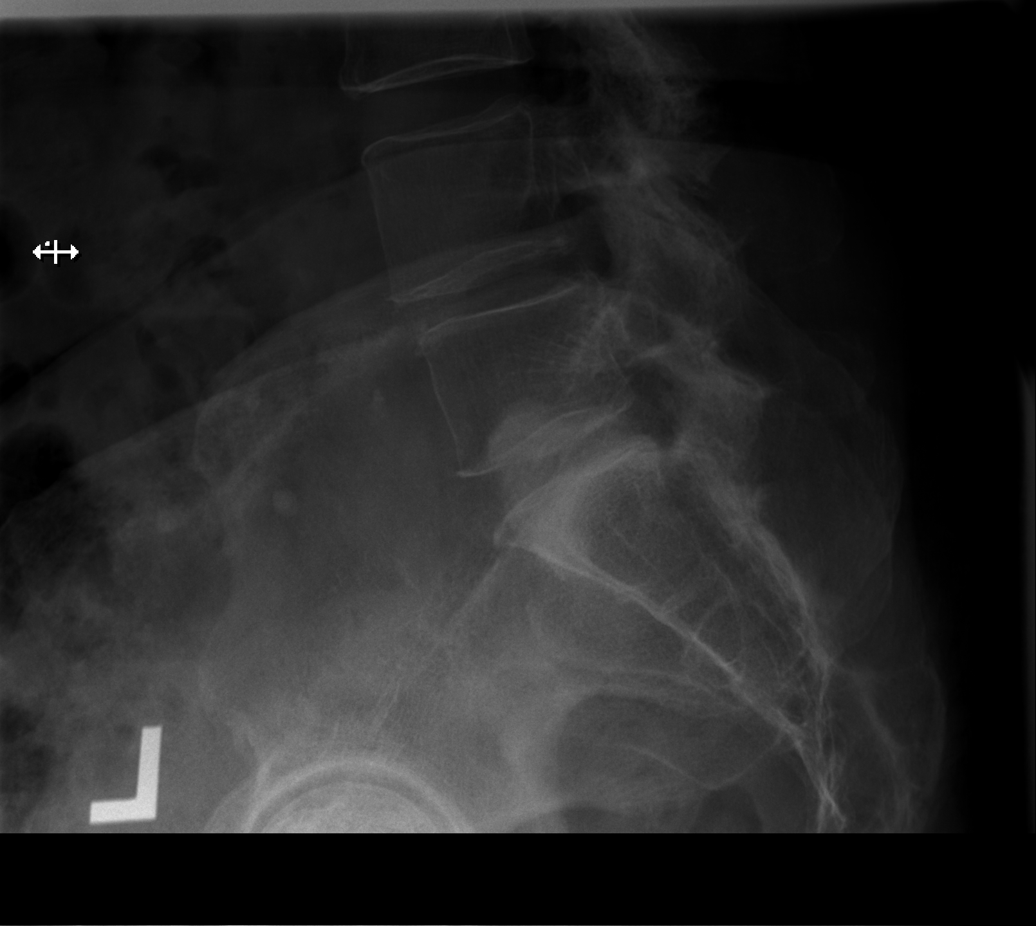

[5 of 5 positions shown; findings below may reference images not displayed]

FINDINGS: There is no evidence of acute lumbar spine fracture. There is grade
1 degenerative anterolisthesis at L4-L5. Mild-to-moderate multilevel
degenerative disc disease worst at L4-L5 and L5-S1. Moderate
multilevel facet arthropathy. There is a mild displaced left
posterior eleventh rib fracture.
IMPRESSION: Mild displaced left posterior eleventh rib fracture. No evidence of
lumbar spine fracture.

Multilevel degenerative disc disease and facet arthropathy with
degenerative grade 1 anterolisthesis at L4-L5.

## 2023-04-23 IMAGING — CR DG RIBS W/ CHEST 3+V*L*
5 series · 5 of 5 positions shown · non-contrast
Comparison: Radiograph 03/10/2013, chest CT 05/09/2005

CLINICAL DATA: pain after fall

EXAM:
LEFT RIBS AND CHEST - 3+ VIEW

[t chest supine]
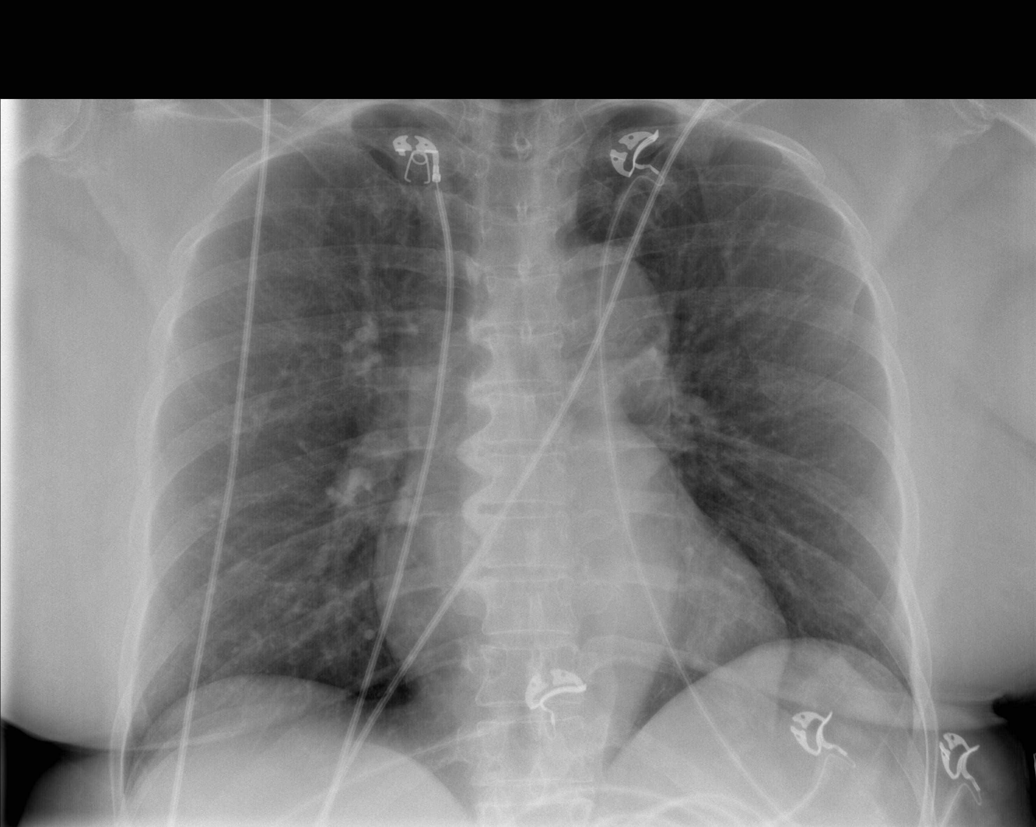

[t ribs ap upper left]
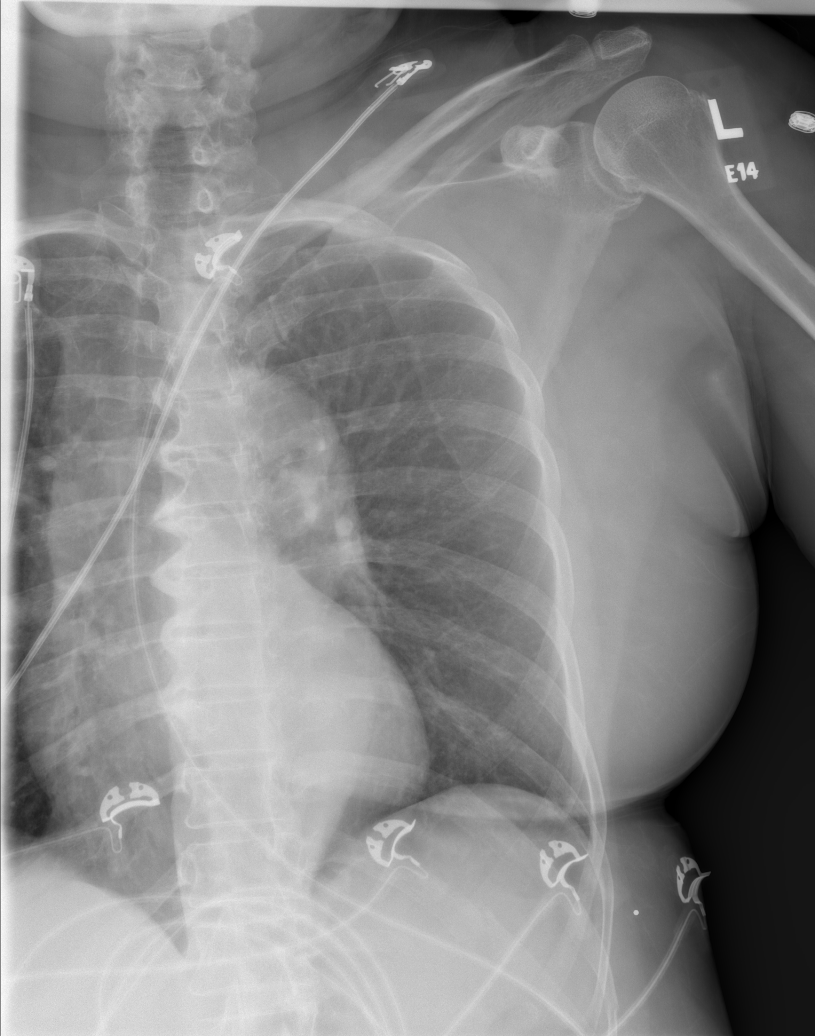

[t ribs ap lower left]
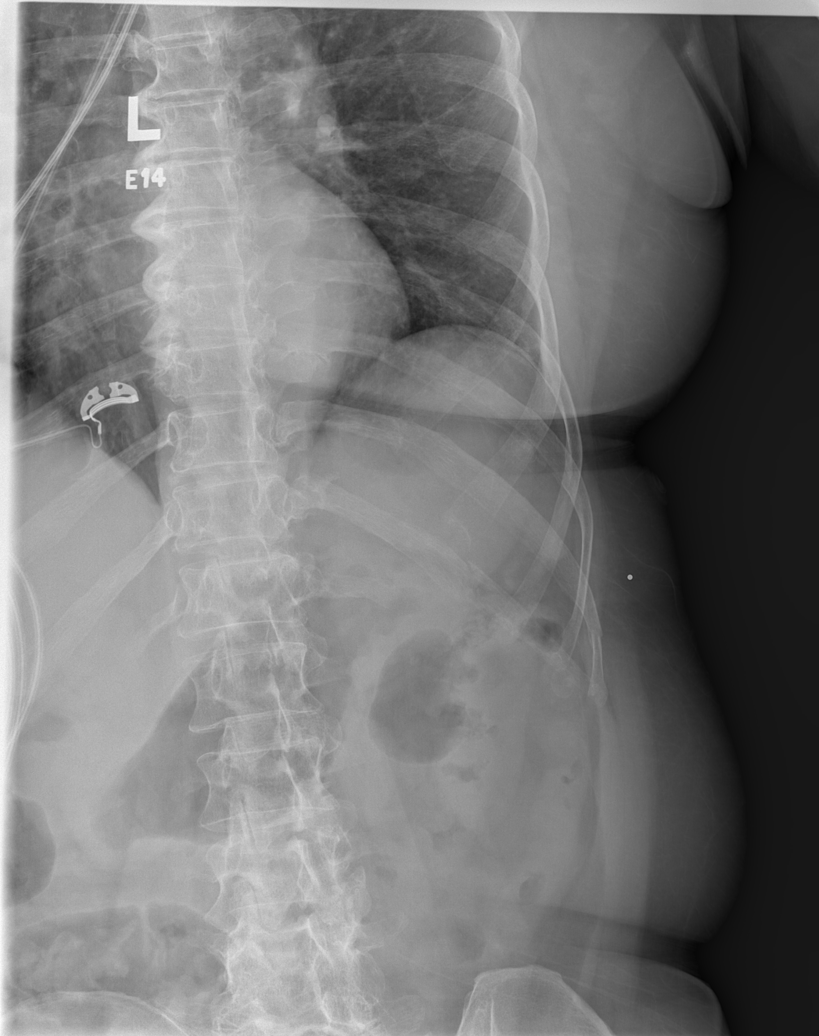

[t ribs lpo left (1 of 2)]
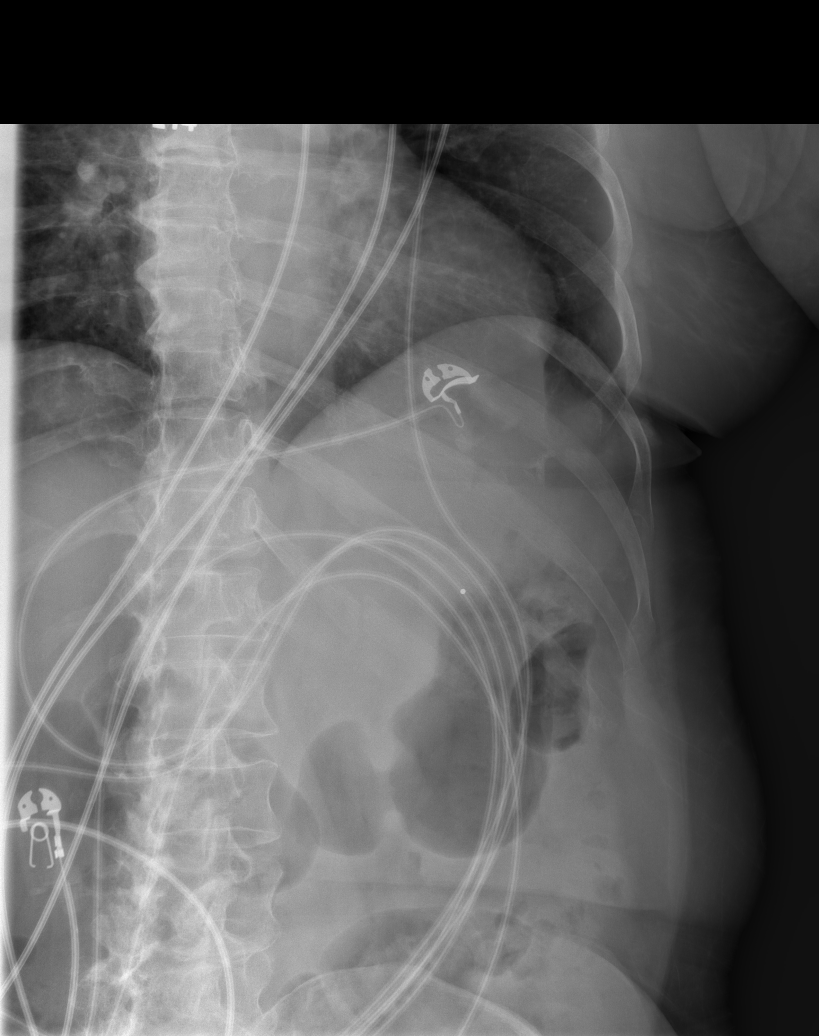

[t ribs lpo left (2 of 2)]
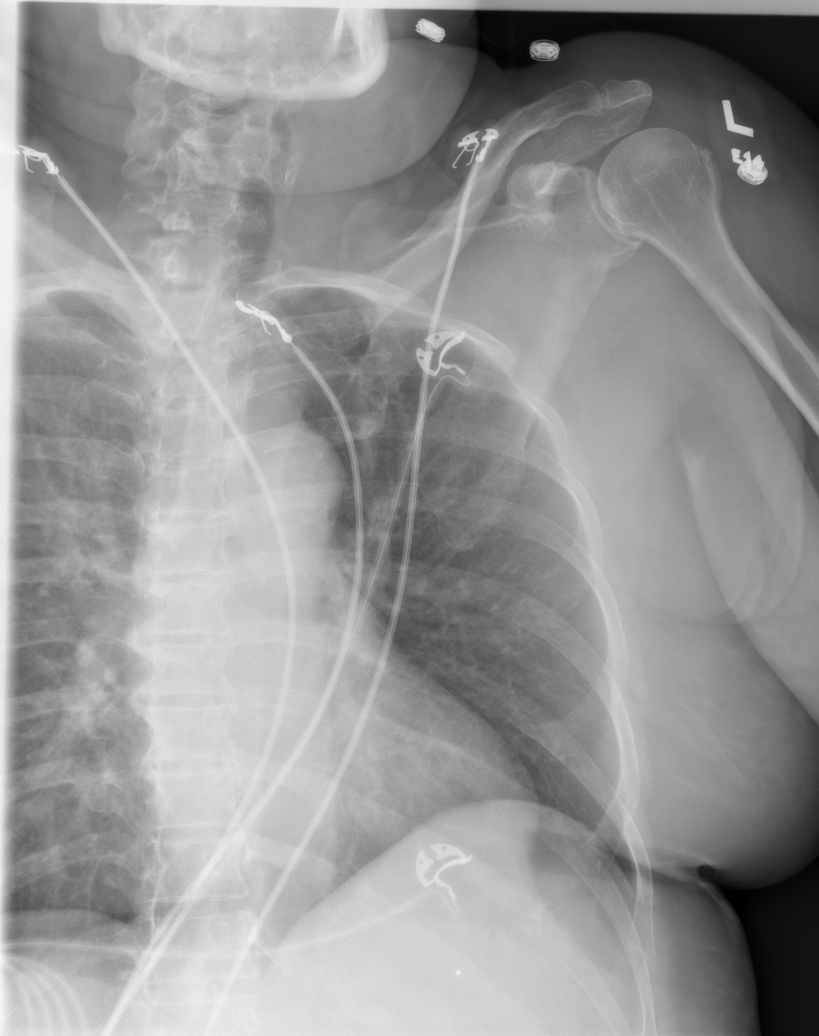

[5 of 5 positions shown; findings below may reference images not displayed]

FINDINGS: Minimally displaced left lateral tenth and posterior eleventh rib
fractures. The lungs are clear. No visible pleural effusion or
pneumothorax. The cardiomediastinal silhouette is unchanged.
IMPRESSION: Minimally displaced left lateral tenth and posterior eleventh rib
fractures.

## 2023-04-23 IMAGING — CR DG HIP (WITH OR WITHOUT PELVIS) 2-3V*L*
3 series · 3 of 3 positions shown · non-contrast
Comparison: None.

CLINICAL DATA: pain after fall

EXAM:
DG HIP (WITH OR WITHOUT PELVIS) 2-3V LEFT

[t pelvis ap]
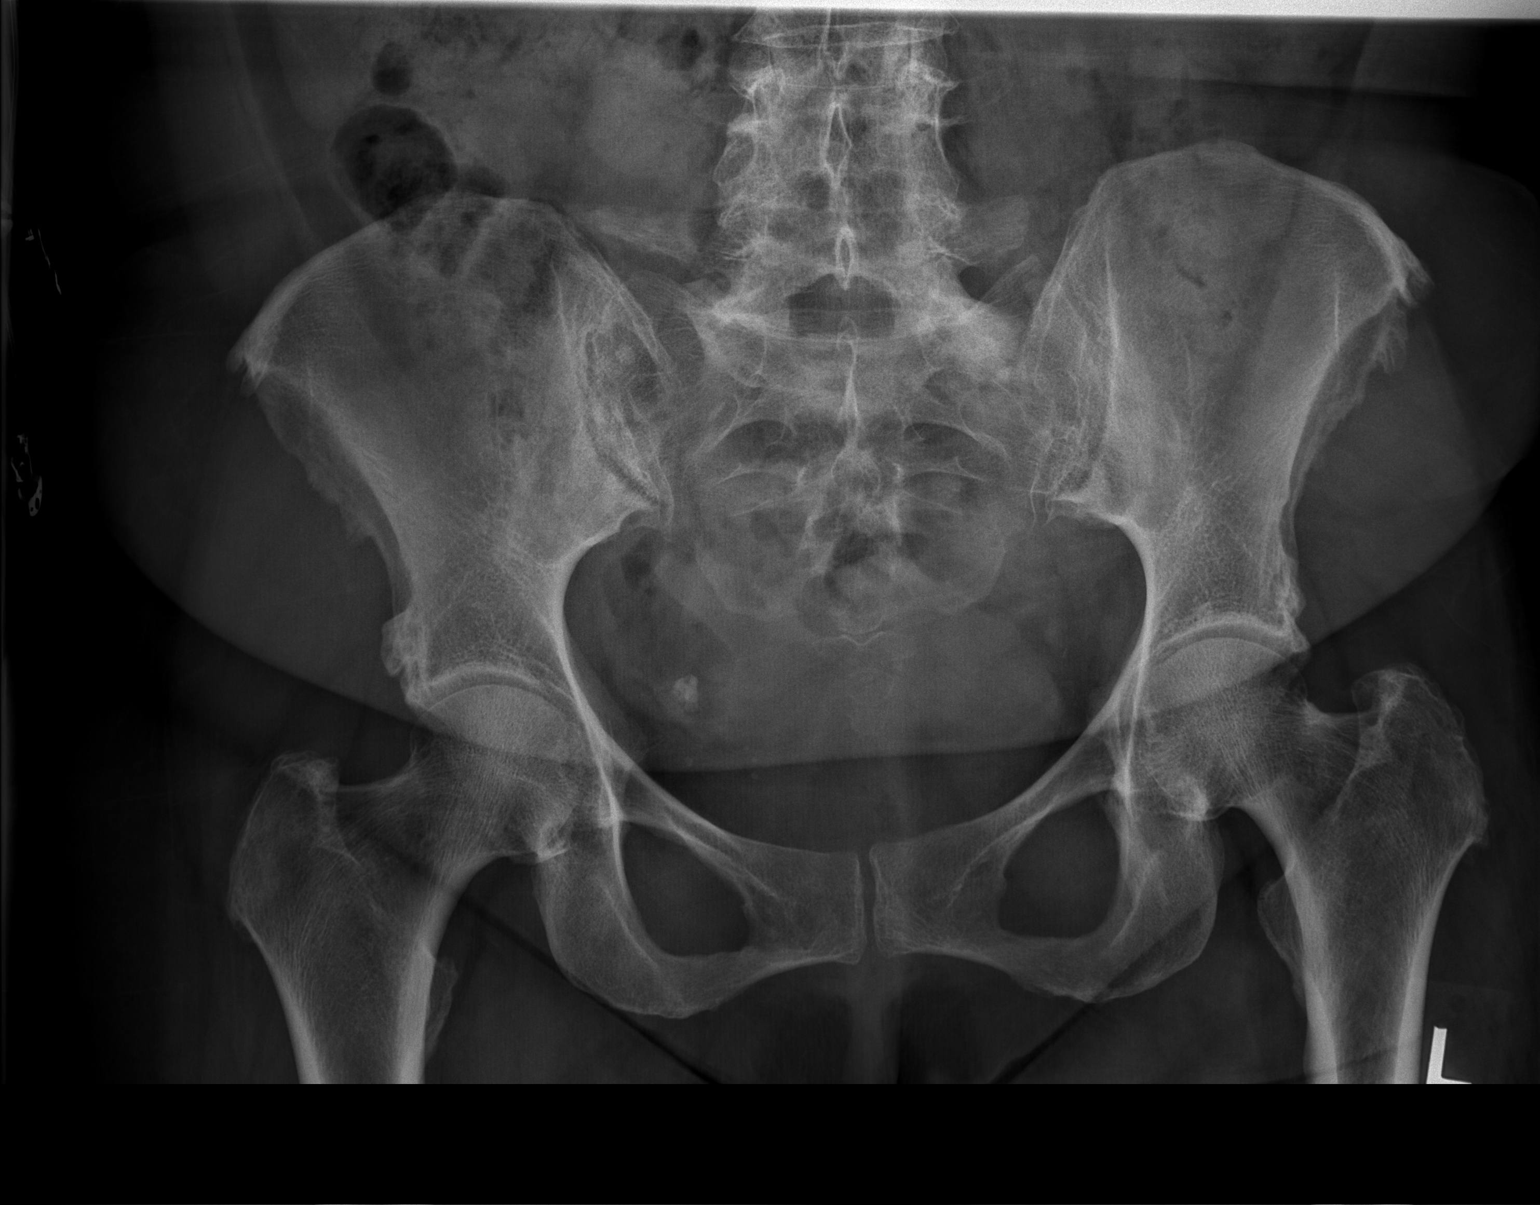

[t hip ap left]
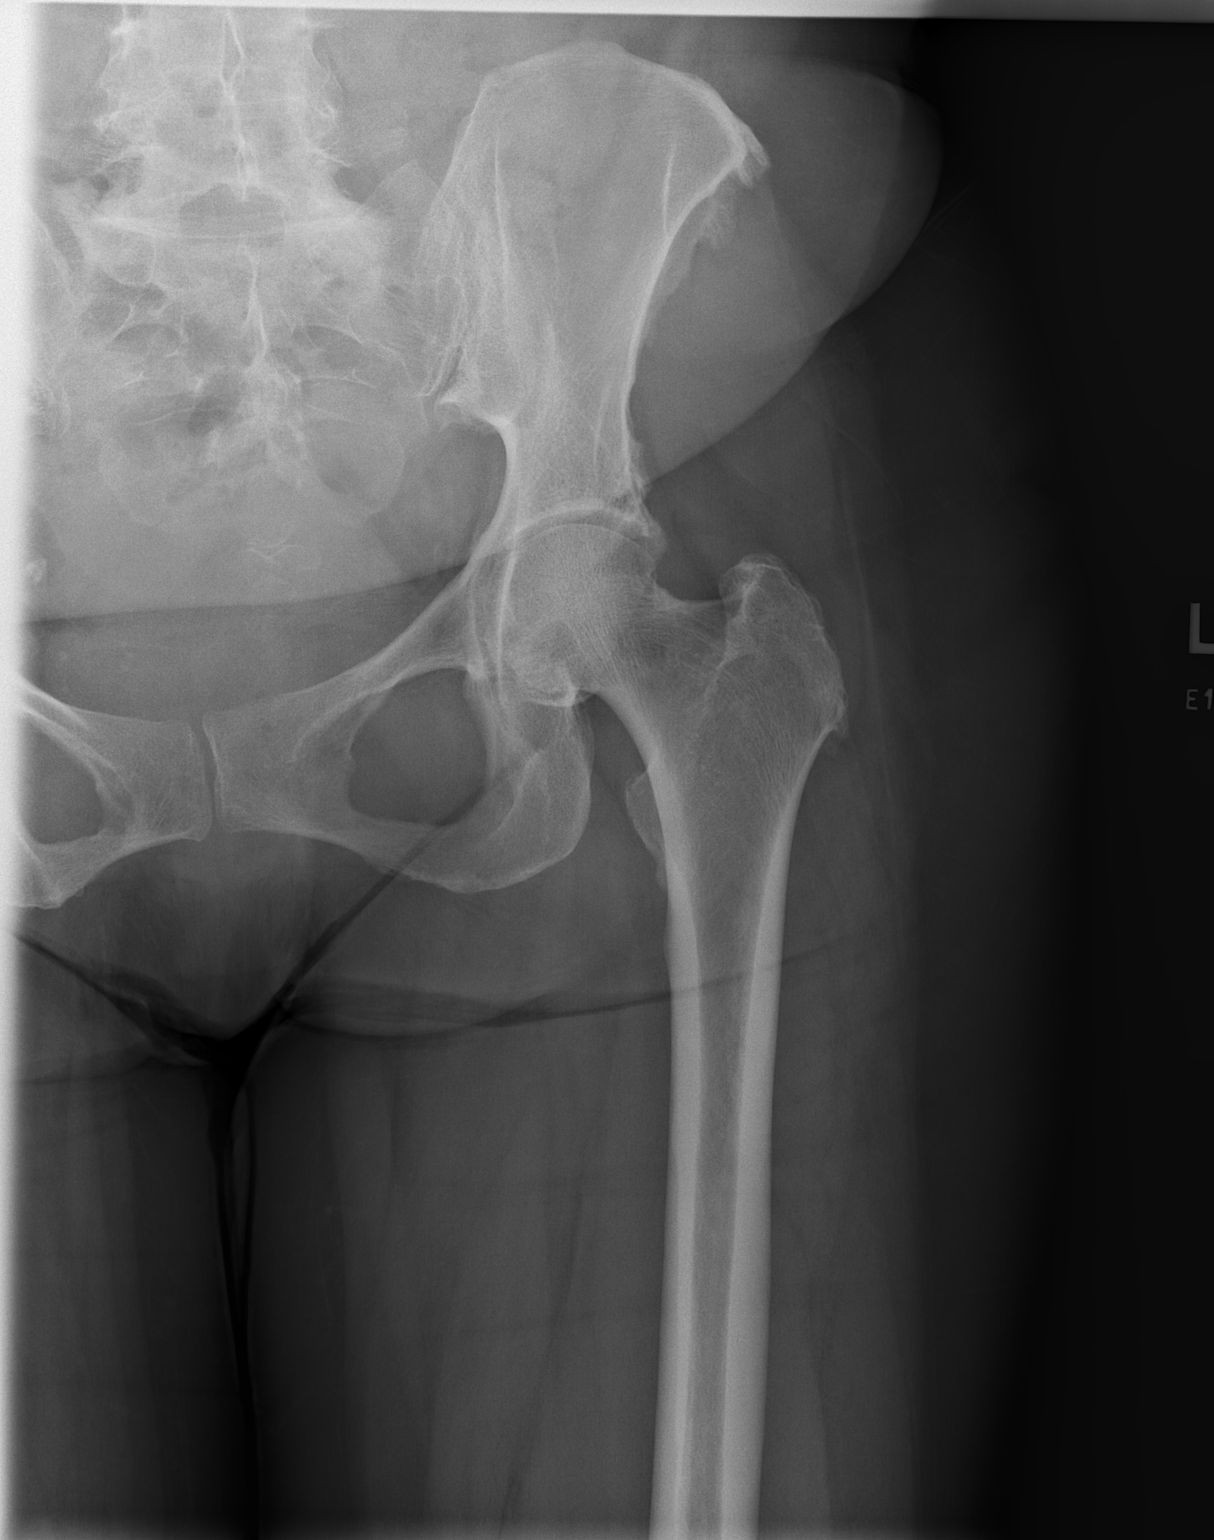

[t hip frog leg left]
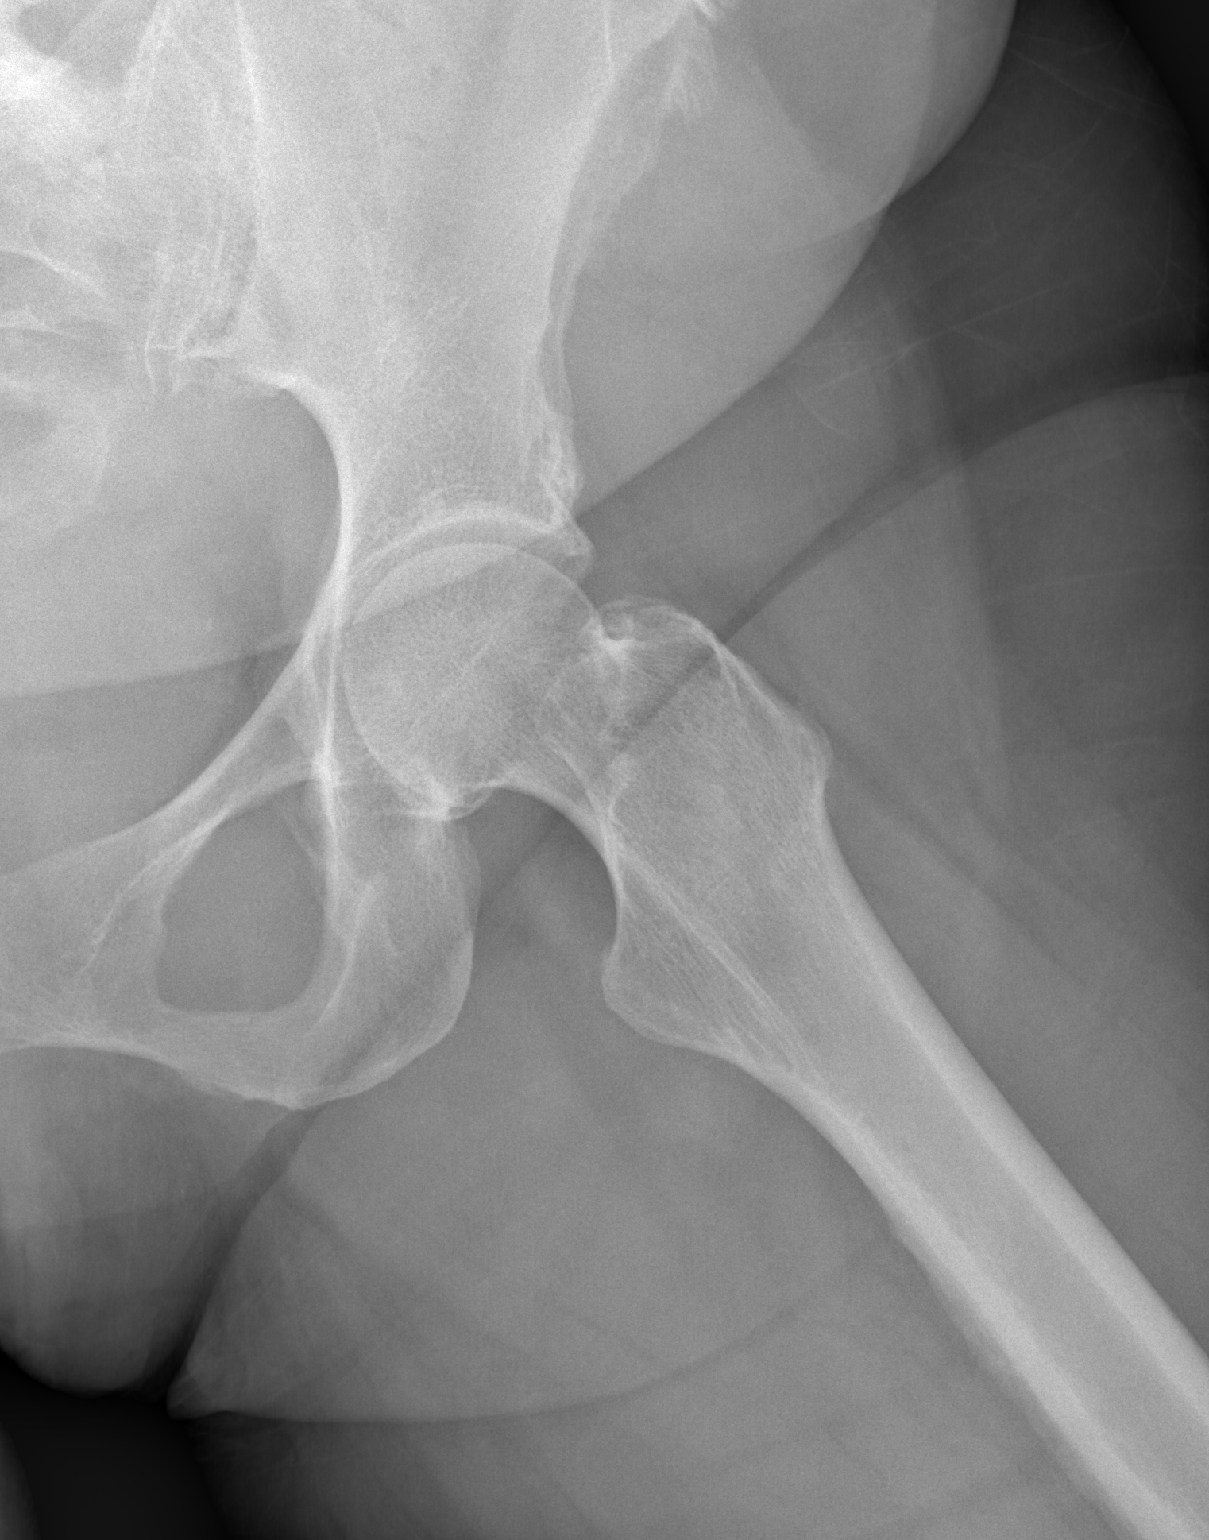

[3 of 3 positions shown; findings below may reference images not displayed]

FINDINGS: There is no evidence of acute fracture. Normal alignment. There is
mild left hip osteoarthritis. Right pelvic phleboliths.
IMPRESSION: No evidence of acute fracture.  Mild left hip osteoarthritis.

## 2023-05-11 ENCOUNTER — Other Ambulatory Visit (HOSPITAL_COMMUNITY)
Admission: RE | Admit: 2023-05-11 | Discharge: 2023-05-11 | Disposition: A | Discharge status: Home / Self Care | Payer: No Typology Code available for payment source | Source: Ambulatory Visit | Attending: Oncology | Admitting: Oncology

## 2023-05-11 DIAGNOSIS — Z006 Encounter for examination for normal comparison and control in clinical research program: Secondary | ICD-10-CM | POA: Insufficient documentation

## 2023-05-23 LAB — GENECONNECT MOLECULAR SCREEN: Genetic Analysis Overall Interpretation: NEGATIVE

## 2023-06-25 ENCOUNTER — Inpatient Hospital Stay: Payer: No Typology Code available for payment source | Attending: Gynecologic Oncology | Admitting: Gynecologic Oncology

## 2023-06-25 DIAGNOSIS — R300 Dysuria: Secondary | ICD-10-CM | POA: Insufficient documentation

## 2023-06-25 DIAGNOSIS — R3915 Urgency of urination: Secondary | ICD-10-CM | POA: Insufficient documentation

## 2023-06-25 DIAGNOSIS — D259 Leiomyoma of uterus, unspecified: Secondary | ICD-10-CM

## 2023-06-25 DIAGNOSIS — N83209 Unspecified ovarian cyst, unspecified side: Secondary | ICD-10-CM | POA: Diagnosis not present

## 2023-06-28 ENCOUNTER — Inpatient Hospital Stay: Payer: No Typology Code available for payment source

## 2023-06-28 ENCOUNTER — Encounter: Payer: Self-pay | Admitting: Gynecologic Oncology

## 2023-06-28 ENCOUNTER — Telehealth: Payer: Self-pay | Admitting: *Deleted

## 2023-06-28 DIAGNOSIS — R300 Dysuria: Secondary | ICD-10-CM | POA: Diagnosis present

## 2023-06-28 DIAGNOSIS — R3915 Urgency of urination: Secondary | ICD-10-CM | POA: Diagnosis not present

## 2023-06-28 LAB — URINALYSIS, COMPLETE (UACMP) WITH MICROSCOPIC
Bacteria, UA: NONE SEEN
Bilirubin Urine: NEGATIVE
Glucose, UA: NEGATIVE mg/dL
Hgb urine dipstick: NEGATIVE
Ketones, ur: NEGATIVE mg/dL
Leukocytes,Ua: NEGATIVE
Nitrite: NEGATIVE
Protein, ur: NEGATIVE mg/dL
Specific Gravity, Urine: 1.016 (ref 1.005–1.030)
pH: 5 (ref 5.0–8.0)

## 2023-06-28 NOTE — Telephone Encounter (Signed)
 Spoke with Heather Bailey who states she is still having urinary symptoms that haven't changed. Pt endorses burning and pain on urination with frequency and urgency without fever or chills. Offered patient a lab appt. Today to send for urinalysis and culture. Pt agreed to appt. At 2 pm. Advised patient that the office would call when we have the results. Pt thanked the office for calling and message relayed to provider.

## 2023-06-28 NOTE — Progress Notes (Signed)
 Gynecologic Oncology Telehealth Note: Gyn-Onc  I connected with Heather Bailey on 06/28/23 at  4:15 PM EST by telephone and verified that I am speaking with the correct person using two identifiers.  I discussed the limitations, risks, security and privacy concerns of performing an evaluation and management service by telemedicine and the availability of in-person appointments. I also discussed with the patient that there may be a patient responsible charge related to this service. The patient expressed understanding and agreed to proceed.  Other persons participating in the visit and their role in the encounter: none.  Patient's location: home Provider's location: [place]  Reason for Visit: follow-up  Treatment History: The patient reports being followed for at least the last 3-4 years for cyst on her ovaries.  At the time that this was initially found, she did not have insurance.  Now she has insurance and is following with her OB/GYN regularly.   Pelvic ultrasound exam performed on 04/30/2021 at [facility] showed a uterus measuring 6.3 x 6.3 x 4.1 cm with an endometrial lining of 7.3 mm.  Left ovary measures up to 5.7 cm in size and right up to 5.2 cm in size.  Multiple cysts seen in bilateral ovaries, largest on the right measures up to 3.2 cm and on the left measures up to 3.5 cm.   Tumor markers were performed on 11/20/2021.  CEA was 1.9, CA 19-9 was <2 and CA-125 was mildly elevated at 54.9. Pelvic ultrasound exam at [facility] on 11/20/2021 showed a uterus measuring 5.8 x 6.8 x 4.3 cm with an endometrial lining of 3.1 mm.  Left ovary measures 5.3 x 4.3 x 4 cm.  Right ovary measures 4.4 x 3.3 x 2.6 cm.  There are multiple simple cyst seen bilaterally, the 3 largest on the right measuring up to 2.8 cm, along with a small projection within the cyst.  Left also with multiple simple cysts, the largest measuring up to 2.8 cm.  Multiple fibroids noted within the uterus, largest measuring  up to 2.9 cm.  Small amount of free fluid noted within the cul-de-sac.  Pelvic ultrasound 12/2021: 1. Multiple uterine fibroids. 2. Bilateral simple ovarian cysts. Multiple ovarian cysts, largest 4.3 centimeters. Recommend follow-up pelvic US  in 3-6 months. Note: This recommendation does not apply to premenarchal patients or to those with increased risk (genetic, family history, elevated tumor markers or other high-risk factors) of ovarian cancer. Reference: Radiology 2019 Nov; 293(2):359-371.  Pelvic ultrasound 04/2022: 1. Uterine fibroids, described above. 2. Findings suggest bilateral hydrosalpinx. No definite interval change, but comparison between the studies is difficult due to interoperator differences. 3. Endometrium not visualized. 4. The sonographic findings are indeterminate, and MRI of the pelvis might be helpful for further evaluation.  Pelvic MRI 08/2022: 1. Enlarged bilateral ovaries containing multiple cysts, measuring up to 3.1 cm on the left and 5.5 cm on the right. Apparent mural thickening along the posterolateral right ovary does not demonstrate associated restricted diffusion or definite enhancement and is favored to reflect a benign process. A 4 mm focus of nodular enhancement is seen along the posterior left ovary, which is indeterminate but may be artifactual related to volume averaging. Continued follow-up pelvic ultrasound in 6-12 months can be considered to assess for interval change, as clinically indicated. 2. Multiple intrauterine masses measuring up to 2.7 cm, likely leiomyomas. The endometrium is distorted, measuring 4 mm.  Pelvic ultrasound 03/2023: 1. No significant interval change in size or appearance of septated cystic lesions  within the bilateral ovaries. As noted on previous MR there are multiple irregular internal areas of septation, some of which appear thickened and containing increased blood flow. 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 2. Small uterine fibroids. 3. Sub optimal visualization of the endometrial stripe which by me best estimate measures 8 mm. On the previous MRI there are no signs of endometrial thickening.   Interval History: Doing well. Has had some dysuria for past couple of months. Denies any vaginal bleeding. Looking for insurance.  Past Medical/Surgical History: Past Medical History:  Diagnosis Date   Anemia Dx 2003   Arthritis Dx 1999   DVT (deep venous thrombosis) (HCC) 2007   got Lupron to shrink fibroids, developed DVT after (blood thinner for 6 months)   Fibroids    Gestational diabetes    Hyperlipidemia Dx 2000   Hypertension Dx 2000   Hypertensive retinopathy    Thyroid  disease Dx 2005    Past Surgical History:  Procedure Laterality Date   CESAREAN SECTION     MYOMECTOMY     x2-3   TUBAL LIGATION     at c-section    Family History  Problem Relation Age of Onset   Hypertension Mother    Stomach cancer Mother    Alzheimer's disease Sister    Colon cancer Neg Hx    Colon polyps Neg Hx    Esophageal cancer Neg Hx    Rectal cancer Neg Hx     Social History   Socioeconomic History   Marital status: Married    Spouse name: Not on file   Number of children: 2   Years of education: Not on file   Highest education level: Bachelor's degree (e.g., BA, AB, BS)  Occupational History   Occupation: Tool crib assistant  Tobacco Use   Smoking status: Never   Smokeless tobacco: Never  Vaping Use   Vaping status: Never Used  Substance and Sexual Activity   Alcohol use: No   Drug use: No   Sexual activity: Not Currently    Birth control/protection: Surgical  Other Topics Concern   Not on file  Social History Narrative   Lives with husband who has diabetes.    Social Drivers of Health   Financial Resource Strain: Medium Risk (04/15/2023)   Overall Financial Resource Strain (CARDIA)    Difficulty of Paying Living Expenses: Somewhat hard  Food Insecurity: Food  Insecurity Present (04/16/2023)   Hunger Vital Sign    Worried About Running Out of Food in the Last Year: Never true    Ran Out of Food in the Last Year: Sometimes true  Transportation Needs: No Transportation Needs (04/16/2023)   PRAPARE - Administrator, Civil Service (Medical): No    Lack of Transportation (Non-Medical): No  Physical Activity: Insufficiently Active (04/15/2023)   Exercise Vital Sign    Days of Exercise per Week: 2 days    Minutes of Exercise per Session: 10 min  Stress: No Stress Concern Present (04/15/2023)   [employer] of Occupational Health - Occupational Stress Questionnaire    Feeling of Stress : Only a little  Social Connections: Socially Integrated (04/16/2023)   Social Connection and Isolation Panel [NHANES]    Frequency of Communication with Friends and Family: More than three times a week    Frequency of Social Gatherings with Friends and Family: More than three times a week    Attends Religious Services: 1 to 4 times per year  Active Member of Clubs or Organizations: No    Attends Engineer, Structural: More than 4 times per year    Marital Status: Married    Current Medications:  Current Outpatient Medications:    acetaminophen -codeine  (TYLENOL  #3) 300-30 MG tablet, Take 2 tablets by mouth every 8 (eight) hours as needed for moderate pain., Disp: 60 tablet, Rfl: 0   atorvastatin  (LIPITOR) 40 MG tablet, Take 1 tablet (40 mg total) by mouth daily., Disp: 30 tablet, Rfl: 0   cycloSPORINE  (RESTASIS ) 0.05 % ophthalmic emulsion, Instill 1 drop in both eyes twice a day, Disp: 60 each, Rfl: 3   diclofenac  sodium (VOLTAREN ) 1 % GEL, Apply 2 g topically 4 (four) times daily., Disp: 100 g, Rfl: 1   glucose blood (TRUE METRIX BLOOD GLUCOSE TEST) test strip, Use as instructed to check blood sugar up to 3 times daily., Disp: 100 each, Rfl: 2   levothyroxine  (SYNTHROID ) 50 MCG tablet, Take 1 tablet (50 mcg total) by mouth daily.Please make  an appointment., Disp: 90 tablet, Rfl: 1   Lidocaine  (HM LIDOCAINE  PATCH) 4 % PTCH, Apply 1 patch topically every 12 (twelve) hours as needed., Disp: 30 patch, Rfl: 0   losartan  (COZAAR ) 25 MG tablet, Take 1 tablet (25 mg total) by mouth daily. Must have office visit for refills, Disp: 90 tablet, Rfl: 1   metFORMIN  (GLUCOPHAGE -XR) 500 MG 24 hr tablet, Take 2 tablets (1,000 mg total) by mouth daily., Disp: 180 tablet, Rfl: 1   metoprolol  succinate (TOPROL -XL) 25 MG 24 hr tablet, Take 1 tablet (25 mg total) by mouth daily., Disp: 90 tablet, Rfl: 1   Multiple Vitamin (MULTIVITAMIN ADULT PO), Take by mouth., Disp: , Rfl:    sertraline  (ZOLOFT ) 100 MG tablet, Take 1 tablet (100 mg total) by mouth daily., Disp: 90 tablet, Rfl: 1   TRUEPLUS LANCETS 28G MISC, Use to check blood sugar up to 3 times daily., Disp: 100 each, Rfl: 2  Review of Symptoms: Pertinent positives as per HPI.  Physical Exam: Deferred given limitations of phone visit.  Laboratory & Radiologic Studies: See anette  Assessment & Plan: Heather Bailey is a 63 y.o. woman with fibroid uterus, complex adnexal masses bilaterally.  Discussed recent imaging. Stable appearance of bilateral adnexal masses. Mildly thickened endometrium although measurement challenging given distortion of endometrium. No vaginal bleeding. Discussed option to move forward with surgery vs continued imaging surveillance. She is still wanting to consider surgery in the near future but working on getting insurance. She will contact me when she gets an insurance plan so that we can look into the cost of surgery.  Given symptoms of urgency and dysuria, discussed getting UA and culture. With multiple months of symptoms I think less likely that this represents a UTI. I've asked her to call the clinic Monday to schedule an appointment to drop off her urine.   I discussed the assessment and treatment plan with the patient. The patient was provided with an opportunity to ask  questions and all were answered. The patient agreed with the plan and demonstrated an understanding of the instructions.   The patient was advised to call back or see an in-person evaluation if the symptoms worsen or if the condition fails to improve as anticipated.   8 minutes of total time was spent for this patient encounter, including preparation, phone counseling with the patient and coordination of care, and documentation of the encounter.   Comer Dollar, MD  Division of Gynecologic Oncology  Department  of Obstetrics and Gynecology  [facility]

## 2023-06-28 NOTE — Telephone Encounter (Signed)
-----   Message from Carver Fila sent at 06/28/2023  9:22 AM EST ----- Hi all, When we talked on Friday, she endorsed some symptoms of possible UTI. Could someone please call her to check in, offer appt to drop of a urine sample? Thank you, Georgiann Hahn

## 2023-06-29 ENCOUNTER — Encounter: Payer: Self-pay | Admitting: Gynecologic Oncology

## 2023-06-29 LAB — URINE CULTURE: Culture: NO GROWTH

## 2023-07-16 ENCOUNTER — Other Ambulatory Visit: Payer: Self-pay | Admitting: Physician Assistant

## 2023-07-16 ENCOUNTER — Other Ambulatory Visit: Payer: Self-pay

## 2023-07-16 DIAGNOSIS — E669 Obesity, unspecified: Secondary | ICD-10-CM

## 2023-07-16 DIAGNOSIS — E1169 Type 2 diabetes mellitus with other specified complication: Secondary | ICD-10-CM

## 2023-07-16 MED ORDER — ATORVASTATIN CALCIUM 40 MG PO TABS
40.0000 mg | ORAL_TABLET | Freq: Every day | ORAL | 0 refills | Status: DC
  Filled 2023-07-16: qty 90, 90d supply, fill #0

## 2023-07-16 NOTE — Telephone Encounter (Signed)
Requested Prescriptions  Pending Prescriptions Disp Refills   atorvastatin (LIPITOR) 40 MG tablet 90 tablet 0    Sig: Take 1 tablet (40 mg total) by mouth daily.     Cardiovascular:  Antilipid - Statins Failed - 07/16/2023 12:30 PM      Failed - Lipid Panel in normal range within the last 12 months    Cholesterol, Total  Date Value Ref Range Status  04/15/2023 167 100 - 199 mg/dL Final   LDL Chol Calc (NIH)  Date Value Ref Range Status  04/15/2023 103 (H) 0 - 99 mg/dL Final   HDL  Date Value Ref Range Status  04/15/2023 44 >39 mg/dL Final   Triglycerides  Date Value Ref Range Status  04/15/2023 112 0 - 149 mg/dL Final         Passed - Patient is not pregnant      Passed - Valid encounter within last 12 months    Recent Outpatient Visits           3 months ago Cysts of both ovaries   [facility] Dept Of [place]. [facility], Marylene Land M, [place]   9 months ago Type 2 diabetes mellitus with obesity [facility])   [facility] [facility] Of [place]. [facility] Marcine Matar, [place]   1 year ago Annual physical exam   [place] [facility] - A Dept Of [place]. [facility] Marcine Matar, MD   1 year ago Type 2 diabetes mellitus with obesity [facility])   [place] [facility] - [facility] Dept Of [facility]. [facility] Jonah Blue B, [place]   1 year ago Closed fracture of multiple ribs of left side with routine healing, subsequent encounter    [facility] - [facility] Dept Of [place]. [facility] Marcine Matar, MD       Future Appointments             In 3 months Laural Benes Binnie Rail, MD [facility] - A Dept Of Eligha Bridegroom. [facility]   In 3 months Terri Piedra, DO [facility]

## 2023-07-21 ENCOUNTER — Other Ambulatory Visit: Payer: Self-pay

## 2023-07-23 ENCOUNTER — Other Ambulatory Visit: Payer: Self-pay

## 2023-08-04 ENCOUNTER — Encounter: Payer: Self-pay | Admitting: Internal Medicine

## 2023-08-11 ENCOUNTER — Encounter: Payer: Self-pay | Admitting: Internal Medicine

## 2023-08-11 ENCOUNTER — Other Ambulatory Visit: Payer: Self-pay | Admitting: Internal Medicine

## 2023-08-11 DIAGNOSIS — E1169 Type 2 diabetes mellitus with other specified complication: Secondary | ICD-10-CM

## 2023-08-11 MED ORDER — ATORVASTATIN CALCIUM 80 MG PO TABS
80.0000 mg | ORAL_TABLET | Freq: Every day | ORAL | 1 refills | Status: DC
  Filled 2023-08-11: qty 90, 90d supply, fill #0

## 2023-08-12 ENCOUNTER — Other Ambulatory Visit: Payer: Self-pay

## 2023-08-13 ENCOUNTER — Other Ambulatory Visit: Payer: Self-pay

## 2023-09-15 ENCOUNTER — Other Ambulatory Visit: Payer: Self-pay

## 2023-10-11 ENCOUNTER — Other Ambulatory Visit: Payer: Self-pay | Admitting: Physician Assistant

## 2023-10-11 ENCOUNTER — Other Ambulatory Visit: Payer: Self-pay

## 2023-10-11 DIAGNOSIS — E1169 Type 2 diabetes mellitus with other specified complication: Secondary | ICD-10-CM

## 2023-10-11 DIAGNOSIS — I152 Hypertension secondary to endocrine disorders: Secondary | ICD-10-CM

## 2023-10-11 DIAGNOSIS — Z7984 Long term (current) use of oral hypoglycemic drugs: Secondary | ICD-10-CM

## 2023-10-11 DIAGNOSIS — F32A Depression, unspecified: Secondary | ICD-10-CM

## 2023-10-11 MED ORDER — SERTRALINE HCL 100 MG PO TABS
100.0000 mg | ORAL_TABLET | Freq: Every day | ORAL | 0 refills | Status: DC
  Filled 2023-10-11: qty 30, 30d supply, fill #0

## 2023-10-11 MED ORDER — METOPROLOL SUCCINATE ER 25 MG PO TB24
25.0000 mg | ORAL_TABLET | Freq: Every day | ORAL | 0 refills | Status: DC
  Filled 2023-10-11: qty 30, 30d supply, fill #0

## 2023-10-11 MED ORDER — METFORMIN HCL ER 500 MG PO TB24
1000.0000 mg | ORAL_TABLET | Freq: Every day | ORAL | 0 refills | Status: DC
  Filled 2023-10-11: qty 60, 30d supply, fill #0

## 2023-10-14 ENCOUNTER — Encounter: Payer: Self-pay | Admitting: Internal Medicine

## 2023-10-14 ENCOUNTER — Encounter: Payer: Self-pay | Admitting: Pharmacist

## 2023-10-14 ENCOUNTER — Ambulatory Visit: Attending: Internal Medicine | Admitting: Pharmacist

## 2023-10-14 ENCOUNTER — Ambulatory Visit: Payer: Self-pay | Attending: Internal Medicine | Admitting: Internal Medicine

## 2023-10-14 ENCOUNTER — Other Ambulatory Visit: Payer: Self-pay

## 2023-10-14 VITALS — BP 124/80 | HR 74 | Temp 98.5°F | Ht 66.0 in | Wt 206.0 lb

## 2023-10-14 DIAGNOSIS — I1310 Hypertensive heart and chronic kidney disease without heart failure, with stage 1 through stage 4 chronic kidney disease, or unspecified chronic kidney disease: Secondary | ICD-10-CM

## 2023-10-14 DIAGNOSIS — Z7189 Other specified counseling: Secondary | ICD-10-CM

## 2023-10-14 DIAGNOSIS — E1122 Type 2 diabetes mellitus with diabetic chronic kidney disease: Secondary | ICD-10-CM

## 2023-10-14 DIAGNOSIS — Z6833 Body mass index (BMI) 33.0-33.9, adult: Secondary | ICD-10-CM

## 2023-10-14 DIAGNOSIS — E1169 Type 2 diabetes mellitus with other specified complication: Secondary | ICD-10-CM

## 2023-10-14 DIAGNOSIS — E039 Hypothyroidism, unspecified: Secondary | ICD-10-CM

## 2023-10-14 DIAGNOSIS — E1159 Type 2 diabetes mellitus with other circulatory complications: Secondary | ICD-10-CM

## 2023-10-14 DIAGNOSIS — Z7985 Long-term (current) use of injectable non-insulin antidiabetic drugs: Secondary | ICD-10-CM

## 2023-10-14 DIAGNOSIS — E119 Type 2 diabetes mellitus without complications: Secondary | ICD-10-CM

## 2023-10-14 DIAGNOSIS — E785 Hyperlipidemia, unspecified: Secondary | ICD-10-CM

## 2023-10-14 DIAGNOSIS — N1831 Chronic kidney disease, stage 3a: Secondary | ICD-10-CM

## 2023-10-14 DIAGNOSIS — Z7984 Long term (current) use of oral hypoglycemic drugs: Secondary | ICD-10-CM

## 2023-10-14 DIAGNOSIS — Z2821 Immunization not carried out because of patient refusal: Secondary | ICD-10-CM

## 2023-10-14 DIAGNOSIS — Z1231 Encounter for screening mammogram for malignant neoplasm of breast: Secondary | ICD-10-CM

## 2023-10-14 DIAGNOSIS — E669 Obesity, unspecified: Secondary | ICD-10-CM

## 2023-10-14 LAB — POCT GLYCOSYLATED HEMOGLOBIN (HGB A1C): HbA1c, POC (controlled diabetic range): 6.5 % (ref 0.0–7.0)

## 2023-10-14 LAB — GLUCOSE, POCT (MANUAL RESULT ENTRY): POC Glucose: 108 mg/dL — AB (ref 70–99)

## 2023-10-14 MED ORDER — SEMAGLUTIDE(0.25 OR 0.5MG/DOS) 2 MG/3ML ~~LOC~~ SOPN
0.2500 mg | PEN_INJECTOR | SUBCUTANEOUS | 0 refills | Status: DC
Start: 2023-10-14 — End: 2024-04-18
  Filled 2023-10-14: qty 3, 28d supply, fill #0

## 2023-10-14 MED ORDER — ATORVASTATIN CALCIUM 80 MG PO TABS
40.0000 mg | ORAL_TABLET | Freq: Every day | ORAL | Status: DC
Start: 2023-10-14 — End: 2024-05-01

## 2023-10-14 NOTE — Progress Notes (Signed)
 Patient ID: Heather Bailey, female    DOB: [birth year]  MRN: [id number]  CC: Diabetes (DM f/u. Alois Arnt atorvastatin  - experienced facial numbness, malaise with new dosage. Reports taking half a tablet/Yes to mammogram. )   Subjective: Heather Bailey is a 63 y.o. female who presents for chronic ds management. Her concerns today include:  Pt with hx of HTN, DM, hypothyroidism, HL, depression, chronic LBP (on Gabapentin ), dry skin, CKD 3a.       Discussed the use of AI scribe software for clinical note transcription with the patient, who gave verbal consent to proceed.  History of Present Illness Heather Bailey is a 63 year old female with diabetes, hypertension, and hyperlipidemia who presents for follow-up of her chronic conditions.  HL:  We increased the dose of Atorvastatin  from 40 to 80 mg after her her LDL was 103.  She experienced adverse effects from atorvastatin , including facial numbness, after increasing the dose to 80 mg. She reduced the dose to 40 mg, which she tolerates well without further issues.   DM: Results for orders placed or performed in visit on 10/14/23  POCT glucose (manual entry)   Collection Time: 10/14/23  1:44 PM  Result Value Ref Range   POC Glucose 108 (A) 70 - 99 mg/dl  POCT glycosylated hemoglobin (Hb A1C)   Collection Time: 10/14/23  1:46 PM  Result Value Ref Range   Hemoglobin A1C     HbA1c POC (<> result, manual entry)     HbA1c, POC (prediabetic range)     HbA1c, POC (controlled diabetic range) 6.5 0.0 - 7.0 %   Her diabetes is managed with metformin  500 mg, two tablets daily, with an A1c of 6.5%.  She is wondering about getting off metformin .  States she has heard a lot of bad things about it.  She has retired and plans to start cooking more.  However lately she has been eating a lot of fast foods.  She walks once a week with her son for exercise.  She has gained 11 pounds since her last visit and experiences knee pain and difficulty with stairs.  She  attributes the knee pain to the weight gain. She has been feeling depressed and unmotivated, impacting her physical activity and eating habits. - Due for diabetic eye exam.  Plans to have this done soon as she does have vision coverage insurance.  HTN: Her blood pressure was last recorded at 128/82 mmHg, and she continues to take losartan  25 mg daily. She has not been checking her blood pressure regularly but reports it has been stable.  CKD: Kidney function is not at 100% but has remained stable with GFR in the 50s.  Thyroid : She continues to take the levothyroxine .  Last TSH was checked in October and was normal.   HM: She declines shingles vaccine. Patient Active Problem List   Diagnosis Date Noted   Hyperlipidemia associated with type 2 diabetes mellitus (HCC) 11/03/2019   Class 1 obesity due to excess calories with serious comorbidity and body mass index (BMI) of 32.0 to 32.9 in adult 11/03/2019   Floaters in visual field, right 11/15/2018   Onychomycosis 08/15/2018   Achilles tendinitis of right lower extremity 08/15/2018   Hot flashes 02/23/2017   Chronic low back pain 04/12/2015   Dry skin 04/12/2015   Chronic dryness of both eyes 08/28/2014   DM2 (diabetes mellitus, type 2) (HCC) 03/19/2014   Hypothyroidism 03/19/2014   Hypertension 03/19/2014   Hyperlipidemia 03/19/2014  Depression 03/19/2014     Current Outpatient Medications on File Prior to Visit  Medication Sig Dispense Refill   acetaminophen -codeine  (TYLENOL  #3) 300-30 MG tablet Take 2 tablets by mouth every 8 (eight) hours as needed for moderate pain. 60 tablet 0   atorvastatin  (LIPITOR) 80 MG tablet Take 1 tablet (80 mg total) by mouth daily. 90 tablet 1   cycloSPORINE  (RESTASIS ) 0.05 % ophthalmic emulsion Instill 1 drop in both eyes twice a day 60 each 3   diclofenac  sodium (VOLTAREN ) 1 % GEL Apply 2 g topically 4 (four) times daily. 100 g 1   glucose blood (TRUE METRIX BLOOD GLUCOSE TEST) test strip Use as  instructed to check blood sugar up to 3 times daily. 100 each 2   levothyroxine  (SYNTHROID ) 50 MCG tablet Take 1 tablet (50 mcg total) by mouth daily.Please make an appointment. 90 tablet 1   Lidocaine  (HM LIDOCAINE  PATCH) 4 % PTCH Apply 1 patch topically every 12 (twelve) hours as needed. 30 patch 0   losartan  (COZAAR ) 25 MG tablet Take 1 tablet (25 mg total) by mouth daily. Must have office visit for refills 90 tablet 1   metFORMIN  (GLUCOPHAGE -XR) 500 MG 24 hr tablet Take 2 tablets (1,000 mg total) by mouth daily. 60 tablet 0   metoprolol  succinate (TOPROL -XL) 25 MG 24 hr tablet Take 1 tablet (25 mg total) by mouth daily. 30 tablet 0   Multiple Vitamin (MULTIVITAMIN ADULT PO) Take by mouth.     sertraline  (ZOLOFT ) 100 MG tablet Take 1 tablet (100 mg total) by mouth daily. 30 tablet 0   TRUEPLUS LANCETS 28G MISC Use to check blood sugar up to 3 times daily. 100 each 2   No current facility-administered medications on file prior to visit.    Allergies  Allergen Reactions   Zithromax [Azithromycin Dihydrate]     Social History   Socioeconomic History   Marital status: Married    Spouse name: Not on file   Number of children: 2   Years of education: Not on file   Highest education level: Bachelor's degree (e.g., BA, AB, BS)  Occupational History   Occupation: Tool crib assistant  Tobacco Use   Smoking status: Never   Smokeless tobacco: Never  Vaping Use   Vaping status: Never Used  Substance and Sexual Activity   Alcohol use: No   Drug use: No   Sexual activity: Not Currently    Birth control/protection: Surgical  Other Topics Concern   Not on file  Social History Narrative   Lives with husband who has diabetes.    Social Drivers of Health   Financial Resource Strain: High Risk (10/14/2023)   Overall Financial Resource Strain (CARDIA)    Difficulty of Paying Living Expenses: Hard  Food Insecurity: Food Insecurity Present (10/14/2023)   Hunger Vital Sign    Worried About  Running Out of Food in the Last Year: Sometimes true    Ran Out of Food in the Last Year: Sometimes true  Transportation Needs: No Transportation Needs (10/14/2023)   PRAPARE - Administrator, Civil Service (Medical): No    Lack of Transportation (Non-Medical): No  Physical Activity: Inactive (10/14/2023)   Exercise Vital Sign    Days of Exercise per Week: 0 days    Minutes of Exercise per Session: 10 min  Stress: No Stress Concern Present (10/14/2023)   [employer] of Occupational Health - Occupational Stress Questionnaire    Feeling of Stress : Only a little  Social Connections: Moderately Integrated (10/14/2023)   Social Connection and Isolation Panel [NHANES]    Frequency of Communication with Friends and Family: Once a week    Frequency of Social Gatherings with Friends and Family: Once a week    Attends Religious Services: More than 4 times per year    Active Member of [employer] or Organizations: Yes    Attends Banker Meetings: More than 4 times per year    Marital Status: Married  Catering manager Violence: Unknown (08/03/2017)   Humiliation, Afraid, Rape, and Kick questionnaire    Fear of Current or Ex-Partner: Patient declined    Emotionally Abused: Patient declined    Physically Abused: Patient declined    Sexually Abused: Patient declined    Family History  Problem Relation Age of Onset   Hypertension Mother    Stomach cancer Mother    Alzheimer's disease Sister    Colon cancer Neg Hx    Colon polyps Neg Hx    Esophageal cancer Neg Hx    Rectal cancer Neg Hx     Past Surgical History:  Procedure Laterality Date   CESAREAN SECTION     MYOMECTOMY     x2-3   TUBAL LIGATION     at c-section    ROS: Review of Systems Negative except as stated above  PHYSICAL EXAM: BP 124/80   Pulse 74   Temp 98.5 F (36.9 C) (Oral)   Ht 5\' 6"  (1.676 m)   Wt 206 lb (93.4 kg)   SpO2 97%   BMI 33.25 kg/m   Wt Readings from Last 3 Encounters:   10/14/23 206 lb (93.4 kg)  04/15/23 201 lb 3.2 oz (91.3 kg)  09/21/22 195 lb (88.5 kg)    Physical Exam  General appearance - alert, well appearing, and in no distress Mental status - normal mood, behavior, speech, dress, motor activity, and thought processes Neck - supple, no significant adenopathy Chest - clear to auscultation, no wheezes, rales or rhonchi, symmetric air entry Heart - normal rate, regular rhythm, normal S1, S2, no murmurs, rubs, clicks or gallops Extremities - peripheral pulses normal, no pedal edema, no clubbing or cyanosis Diabetic Foot Exam - Simple   Simple Foot Form Diabetic Foot exam was performed with the following findings: Yes 10/14/2023  5:40 PM  Visual Inspection See comments: Yes Sensation Testing Intact to touch and monofilament testing bilaterally: Yes Pulse Check Posterior Tibialis and Dorsalis pulse intact bilaterally: Yes Comments Toenails on both big toes are cut too short.         Latest Ref Rng & Units 04/15/2023    2:02 PM 04/02/2022   12:25 PM 09/03/2021    1:29 PM  CMP  Glucose 70 - 99 mg/dL 97  96  604   BUN 8 - 27 mg/dL 13  16  15    Creatinine 0.57 - 1.00 mg/dL 5.40  9.81  1.91   Sodium 134 - 144 mmol/L 141  142  138   Potassium 3.5 - 5.2 mmol/L 4.8  5.1  3.7   Chloride 96 - 106 mmol/L 104  104  104   CO2 20 - 29 mmol/L 26  23  27    Calcium  8.7 - 10.3 mg/dL 47.8  29.5  9.6   Total Protein 6.0 - 8.5 g/dL 7.4  7.6  8.2   Total Bilirubin 0.0 - 1.2 mg/dL 0.3  0.3  0.6   Alkaline Phos 44 - 121 IU/L 92  96  72  AST 0 - 40 IU/L 20  15  22    ALT 0 - 32 IU/L 17  17  21     Lipid Panel     Component Value Date/Time   CHOL 167 04/15/2023 1402   TRIG 112 04/15/2023 1402   HDL 44 04/15/2023 1402   CHOLHDL 3.8 04/15/2023 1402   CHOLHDL 4.3 12/19/2015 1620   VLDL 20 12/19/2015 1620   LDLCALC 103 (H) 04/15/2023 1402    CBC    Component Value Date/Time   WBC 4.6 04/02/2022 1225   WBC 5.9 09/03/2021 1305   RBC 4.41 04/02/2022  1225   RBC 4.35 09/03/2021 1305   HGB 12.5 04/02/2022 1225   HCT 39.6 04/02/2022 1225   PLT 278 04/02/2022 1225   MCV 90 04/02/2022 1225   MCH 28.3 04/02/2022 1225   MCH 28.7 09/03/2021 1305   MCHC 31.6 04/02/2022 1225   MCHC 31.9 09/03/2021 1305   RDW 13.6 04/02/2022 1225    ASSESSMENT AND PLAN: 1. Type 2 diabetes mellitus with obesity (HCC) (Primary) A1c is at goal. We discussed her concerns about metformin .  Advised that we continue to monitor kidney function regularly and discontinue if kidney function begins to decline beyond a certain level. Discussed and encourage healthy eating habits including better meal planning. She is wanting to get her weight down some.  Discussed trying her with Ozempic .  Went over with her how the medication works and potential side effects including nausea, vomiting, diarrhea/constipation, pancreatitis and bowel blockage.  Advised that when she starts the medicine, if she develops any severe abdominal pain, vomiting, severe diarrhea/constipation, she should stop the medicine and call us . We will start with the lowest dose of 0.25 mg Ozempic  once a week.  Clinical pharmacist met with her today to teach administration.  Once she starts the Ozempic , I told her to cut back on metformin  to 500 mg 1 tablet daily.  Once we increase to the 0.5 mg in the month providing she is tolerating the medication, we may have to stop metformin  completely. - POCT glycosylated hemoglobin (Hb A1C) - POCT glucose (manual entry) - Semaglutide ,0.25 or 0.5MG /DOS, 2 MG/3ML SOPN; Inject 0.25 mg into the skin once a week.  Dispense: 3 mL; Refill: 0 - Microalbumin / creatinine urine ratio  2. Diabetes mellitus treated with oral medication (HCC) See #1 above  3. Hypertension associated with diabetes (HCC) At goal.  Continue Cozaar  25 mg daily.  4. Hyperlipidemia associated with type 2 diabetes mellitus (HCC) Patient experienced numbness in the face with the higher dose of  atorvastatin  so she decreased back to 40 mg once a day which is acceptable.  5. Acquired hypothyroidism Continue levothyroxine .  Last TSH level was within normal range  6. Stage 3a chronic kidney disease (HCC) We will continue to monitor.  Avoid NSAIDs.  7. Encounter for screening mammogram for malignant neoplasm of breast - MM 3D SCREENING MAMMOGRAM BILATERAL BREAST; Future  8. Herpes zoster vaccination declined    Patient was given the opportunity to ask questions.  Patient verbalized understanding of the plan and was able to repeat key elements of the plan.   This documentation was completed using Paediatric nurse.  Any transcriptional errors are unintentional.  Orders Placed This Encounter  Procedures   MM 3D SCREENING MAMMOGRAM BILATERAL BREAST   Microalbumin / creatinine urine ratio   POCT glycosylated hemoglobin (Hb A1C)   POCT glucose (manual entry)     Requested Prescriptions   Signed Prescriptions  Disp Refills   Semaglutide ,0.25 or 0.5MG /DOS, 2 MG/3ML SOPN 3 mL 0    Sig: Inject 0.25 mg into the skin once a week.    Return in about 4 months (around 02/14/2024).  Concetta Dee, MD, FACP

## 2023-10-14 NOTE — Progress Notes (Signed)
 Patient was educated on the use of the Ozempic  pen. Reviewed necessary supplies and operation of the pen. Patient was able to demonstrate use. All questions and concerns were addressed.  Time spent counseling: 10 minutes   Marene Shape, PharmD, [place], CPP Clinical Pharmacist [facility] [phone]

## 2023-10-14 NOTE — Patient Instructions (Addendum)
 VISIT SUMMARY:  Today, we reviewed the management of your diabetes, hypertension, and hyperlipidemia. We also discussed your recent weight gain, knee pain, and feelings of depression. Adjustments were made to your medications, and we talked about starting a new medication to help with weight loss and diabetes control. We also covered general health maintenance, including the need for a mammogram and eye exam.  YOUR PLAN:  -TYPE 2 DIABETES MELLITUS WITHOUT COMPLICATIONS: Your diabetes is well-controlled with an A1c of 6.5%. We discussed starting Ozempic  0.25 mg once a week to help with weight loss and better diabetes control. You should continue taking metformin  500 mg twice daily until you start Ozempic , then reduce to one tablet daily. Please monitor your blood glucose daily for the first few weeks after starting Ozempic  and keep an eye out for any severe side effects.  Stop the Ozempic  and come in to be seen if you develop any vomiting, abdominal pain, severe diarrhea/constipation.  Continue working on weight loss through diet and exercise.  After being on the Ozempic  for 1 month, if you are tolerating it, please send me a MyChart message to let me know so that we can increase the dose to 0.5 mg once a week.  -OBESITY: We noted some weight gain since your last visit. We discussed using Ozempic  to help with weight loss. You should aim to increase your physical activity, starting with at least 30 minutes of exercise twice a week. We also talked about making dietary changes to support your weight loss goals.  -HYPERLIPIDEMIA: You experienced side effects from the higher dose of atorvastatin , so we reduced it to 40 mg daily, which you tolerate well. Continue taking atorvastatin  40 mg daily.  -HYPOTHYROIDISM, UNSPECIFIED: Your thyroid  levels are stable with your current medication. Continue taking levothyroxine  as prescribed.  -GENERAL HEALTH MAINTENANCE: You are due for a mammogram and an eye exam. We  discussed a mammogram scholarship and your eye insurance. You declined the shingles vaccine. Please schedule your mammogram and eye exam, and ensure the results are sent to our office.  INSTRUCTIONS:  Please send a MyChart message after completing four weeks of Ozempic  to discuss dose adjustment. Follow up with your gynecologist regarding ovarian cyst removal once you have insurance coverage.

## 2023-10-15 ENCOUNTER — Encounter: Payer: Self-pay | Admitting: Internal Medicine

## 2023-10-15 ENCOUNTER — Telehealth: Payer: Self-pay

## 2023-10-15 LAB — MICROALBUMIN / CREATININE URINE RATIO
Creatinine, Urine: 217.4 mg/dL
Microalb/Creat Ratio: 4 mg/g{creat} (ref 0–29)
Microalbumin, Urine: 9.2 ug/mL

## 2023-10-15 NOTE — Telephone Encounter (Signed)
 Patient telephoned and requested mammogram scholarship application. Confirmed patient's address. [facility]

## 2023-10-18 ENCOUNTER — Telehealth: Payer: Self-pay

## 2023-10-18 ENCOUNTER — Other Ambulatory Visit: Payer: Self-pay

## 2023-10-18 NOTE — Telephone Encounter (Signed)
 Pharmacy Patient Advocate Encounter   Received notification from CoverMyMeds that prior authorization for OZEMPIC  is required/requested.   Insurance verification completed.   The patient is insured through [facility] .   Per test claim: PA required; PA submitted to above mentioned insurance via CoverMyMeds Key/confirmation #/EOC [id number] Status is pending

## 2023-10-19 ENCOUNTER — Other Ambulatory Visit: Payer: Self-pay

## 2023-10-21 ENCOUNTER — Other Ambulatory Visit: Payer: Self-pay

## 2023-10-25 ENCOUNTER — Other Ambulatory Visit: Payer: Self-pay

## 2023-11-09 ENCOUNTER — Other Ambulatory Visit: Payer: Self-pay

## 2023-11-09 ENCOUNTER — Other Ambulatory Visit: Payer: Self-pay | Admitting: Internal Medicine

## 2023-11-09 DIAGNOSIS — E1169 Type 2 diabetes mellitus with other specified complication: Secondary | ICD-10-CM

## 2023-11-09 DIAGNOSIS — Z7984 Long term (current) use of oral hypoglycemic drugs: Secondary | ICD-10-CM

## 2023-11-09 MED ORDER — METFORMIN HCL ER 500 MG PO TB24
1000.0000 mg | ORAL_TABLET | Freq: Every day | ORAL | 1 refills | Status: DC
  Filled 2023-11-09 – 2023-11-11 (×2): qty 180, 90d supply, fill #0
  Filled 2024-02-06: qty 180, 90d supply, fill #1

## 2023-11-10 ENCOUNTER — Other Ambulatory Visit: Payer: Self-pay

## 2023-11-11 ENCOUNTER — Other Ambulatory Visit: Payer: Self-pay | Admitting: Physician Assistant

## 2023-11-11 ENCOUNTER — Ambulatory Visit: Payer: No Typology Code available for payment source | Admitting: Dermatology

## 2023-11-11 ENCOUNTER — Other Ambulatory Visit: Payer: Self-pay | Admitting: Internal Medicine

## 2023-11-11 ENCOUNTER — Other Ambulatory Visit: Payer: Self-pay

## 2023-11-11 DIAGNOSIS — I152 Hypertension secondary to endocrine disorders: Secondary | ICD-10-CM

## 2023-11-11 DIAGNOSIS — F419 Anxiety disorder, unspecified: Secondary | ICD-10-CM

## 2023-11-11 DIAGNOSIS — F32A Depression, unspecified: Secondary | ICD-10-CM

## 2023-11-11 MED ORDER — LOSARTAN POTASSIUM 25 MG PO TABS
25.0000 mg | ORAL_TABLET | Freq: Every day | ORAL | 1 refills | Status: DC
  Filled 2023-11-11: qty 90, 90d supply, fill #0
  Filled 2024-02-06: qty 90, 90d supply, fill #1

## 2023-11-11 MED ORDER — METOPROLOL SUCCINATE ER 25 MG PO TB24
25.0000 mg | ORAL_TABLET | Freq: Every day | ORAL | 1 refills | Status: DC
  Filled 2023-11-11: qty 90, 90d supply, fill #0
  Filled 2024-02-06: qty 90, 90d supply, fill #1

## 2023-11-11 MED ORDER — SERTRALINE HCL 100 MG PO TABS
100.0000 mg | ORAL_TABLET | Freq: Every day | ORAL | 1 refills | Status: DC
  Filled 2023-11-11: qty 90, 90d supply, fill #0
  Filled 2024-02-06: qty 90, 90d supply, fill #1

## 2023-11-12 ENCOUNTER — Other Ambulatory Visit: Payer: Self-pay

## 2023-11-27 LAB — HM DIABETES EYE EXAM

## 2023-12-09 ENCOUNTER — Ambulatory Visit
Admission: RE | Admit: 2023-12-09 | Discharge: 2023-12-09 | Disposition: A | Discharge status: Home / Self Care | Source: Ambulatory Visit | Attending: Internal Medicine | Admitting: Internal Medicine

## 2023-12-09 ENCOUNTER — Other Ambulatory Visit: Payer: Self-pay

## 2023-12-09 ENCOUNTER — Other Ambulatory Visit: Payer: Self-pay | Admitting: Physician Assistant

## 2023-12-09 DIAGNOSIS — Z1231 Encounter for screening mammogram for malignant neoplasm of breast: Secondary | ICD-10-CM

## 2023-12-09 DIAGNOSIS — E039 Hypothyroidism, unspecified: Secondary | ICD-10-CM

## 2023-12-09 MED ORDER — LEVOTHYROXINE SODIUM 50 MCG PO TABS
50.0000 ug | ORAL_TABLET | Freq: Every day | ORAL | 1 refills | Status: DC
  Filled 2023-12-09: qty 90, 90d supply, fill #0
  Filled 2024-03-07: qty 90, 90d supply, fill #1

## 2023-12-10 ENCOUNTER — Other Ambulatory Visit: Payer: Self-pay

## 2023-12-14 ENCOUNTER — Ambulatory Visit: Payer: Self-pay | Admitting: Internal Medicine

## 2024-02-09 ENCOUNTER — Other Ambulatory Visit: Payer: Self-pay

## 2024-02-15 ENCOUNTER — Telehealth: Payer: Self-pay | Admitting: *Deleted

## 2024-02-15 NOTE — Telephone Encounter (Signed)
 Spoke with the patient and gave the CPT codes for surgery. Patient also scheduled for an appt with Dr Viktoria on 10/31

## 2024-02-15 NOTE — Telephone Encounter (Signed)
 Patient called and stated that she wanted to see Dr Viktoria to possible move forward with her procedure. Patient wanted to contact billing first for a cost amount due to having no insurance. Explained that the office would have to call her back once the office had the procedure codes to give her. Message forwarded to [facility] APP.    Per Tucker's last note:   Please check with Melissa for CPT codes. Surgery would be Robotic BSO, possible total hysterectomy, possible staging.

## 2024-02-16 ENCOUNTER — Telehealth: Payer: Self-pay | Admitting: Internal Medicine

## 2024-02-16 NOTE — Telephone Encounter (Signed)
 Patient confirmed appointment for 02/17/2024, through volunteer call.

## 2024-02-17 ENCOUNTER — Encounter: Payer: Self-pay | Admitting: Internal Medicine

## 2024-02-17 ENCOUNTER — Ambulatory Visit: Payer: Self-pay | Attending: Internal Medicine | Admitting: Internal Medicine

## 2024-02-17 ENCOUNTER — Other Ambulatory Visit: Payer: Self-pay

## 2024-02-17 VITALS — BP 110/76 | HR 72 | Temp 98.3°F | Ht 66.0 in | Wt 202.0 lb

## 2024-02-17 DIAGNOSIS — E669 Obesity, unspecified: Secondary | ICD-10-CM

## 2024-02-17 DIAGNOSIS — N1831 Chronic kidney disease, stage 3a: Secondary | ICD-10-CM | POA: Insufficient documentation

## 2024-02-17 DIAGNOSIS — R232 Flushing: Secondary | ICD-10-CM | POA: Insufficient documentation

## 2024-02-17 DIAGNOSIS — E1159 Type 2 diabetes mellitus with other circulatory complications: Secondary | ICD-10-CM

## 2024-02-17 DIAGNOSIS — E039 Hypothyroidism, unspecified: Secondary | ICD-10-CM

## 2024-02-17 DIAGNOSIS — Z23 Encounter for immunization: Secondary | ICD-10-CM

## 2024-02-17 DIAGNOSIS — F419 Anxiety disorder, unspecified: Secondary | ICD-10-CM

## 2024-02-17 DIAGNOSIS — Z79890 Hormone replacement therapy (postmenopausal): Secondary | ICD-10-CM | POA: Insufficient documentation

## 2024-02-17 DIAGNOSIS — G43109 Migraine with aura, not intractable, without status migrainosus: Secondary | ICD-10-CM

## 2024-02-17 DIAGNOSIS — F32A Depression, unspecified: Secondary | ICD-10-CM

## 2024-02-17 DIAGNOSIS — N951 Menopausal and female climacteric states: Secondary | ICD-10-CM | POA: Insufficient documentation

## 2024-02-17 DIAGNOSIS — E1169 Type 2 diabetes mellitus with other specified complication: Secondary | ICD-10-CM

## 2024-02-17 DIAGNOSIS — Z7984 Long term (current) use of oral hypoglycemic drugs: Secondary | ICD-10-CM

## 2024-02-17 DIAGNOSIS — Z6832 Body mass index (BMI) 32.0-32.9, adult: Secondary | ICD-10-CM | POA: Insufficient documentation

## 2024-02-17 DIAGNOSIS — I152 Hypertension secondary to endocrine disorders: Secondary | ICD-10-CM

## 2024-02-17 DIAGNOSIS — E119 Type 2 diabetes mellitus without complications: Secondary | ICD-10-CM

## 2024-02-17 DIAGNOSIS — Z79899 Other long term (current) drug therapy: Secondary | ICD-10-CM | POA: Insufficient documentation

## 2024-02-17 DIAGNOSIS — E785 Hyperlipidemia, unspecified: Secondary | ICD-10-CM

## 2024-02-17 DIAGNOSIS — E1122 Type 2 diabetes mellitus with diabetic chronic kidney disease: Secondary | ICD-10-CM | POA: Insufficient documentation

## 2024-02-17 DIAGNOSIS — Z7985 Long-term (current) use of injectable non-insulin antidiabetic drugs: Secondary | ICD-10-CM | POA: Insufficient documentation

## 2024-02-17 LAB — POCT GLYCOSYLATED HEMOGLOBIN (HGB A1C): HbA1c, POC (controlled diabetic range): 6.1 % (ref 0.0–7.0)

## 2024-02-17 LAB — GLUCOSE, POCT (MANUAL RESULT ENTRY): POC Glucose: 112 mg/dL — AB (ref 70–99)

## 2024-02-17 MED ORDER — SERTRALINE HCL 100 MG PO TABS
100.0000 mg | ORAL_TABLET | Freq: Every day | ORAL | 1 refills | Status: AC
  Filled 2024-02-17 – 2024-05-06 (×2): qty 90, 90d supply, fill #0

## 2024-02-17 MED ORDER — SUMATRIPTAN SUCCINATE 50 MG PO TABS
ORAL_TABLET | ORAL | 2 refills | Status: AC
Start: 2024-02-17 — End: ?
  Filled 2024-02-17: qty 10, 10d supply, fill #0

## 2024-02-17 MED ORDER — METFORMIN HCL ER 500 MG PO TB24
1000.0000 mg | ORAL_TABLET | Freq: Every day | ORAL | 1 refills | Status: AC
  Filled 2024-02-17 – 2024-05-06 (×2): qty 180, 90d supply, fill #0

## 2024-02-17 MED ORDER — BLOOD PRESSURE MONITOR DEVI: 0 refills | Status: AC

## 2024-02-17 MED ORDER — METOPROLOL SUCCINATE ER 25 MG PO TB24
25.0000 mg | ORAL_TABLET | Freq: Every day | ORAL | 1 refills | Status: AC
  Filled 2024-02-17 – 2024-05-06 (×2): qty 90, 90d supply, fill #0

## 2024-02-17 MED ORDER — TOPIRAMATE 25 MG PO TABS
25.0000 mg | ORAL_TABLET | Freq: Every day | ORAL | 1 refills | Status: DC
  Filled 2024-02-17: qty 30, 30d supply, fill #0

## 2024-02-17 NOTE — Progress Notes (Signed)
 Patient ID: Heather Bailey, female    DOB: 11/25/1960  MRN: [id number]  CC: Diabetes (DM f/u. Med refill. [place] - BP readings are normal per pt X1 mo/Flu vax administered on 02/17/2024 - C.A)   Subjective: Heather Bailey is a 63 y.o. female who presents for headaches and chronic ds management. Her concerns today include:  Pt with hx of HTN, T2DM, hypothyroidism, HLD (Atorvastatin  40), depression, chronic LBP (on Gabapentin ), dry skin, CKD 3a.    Headaches: Pt reports a long hx of throbbing headaches that started in middle school. In the past they were severe headaches, and bright lights could trigger them and elicit vomiting. Over the years they have improved, and decreased in frequency where they would occur about every other month and last for several days.  Over the past 2 weeks however she has been getting the headaches about every other day.  Headaches start at the back of the neck and radiates forward over the head to the eyes.  She can tell when headache is coming on as she feels a tightness at the back of the neck.  - Headaches are associated with photophobia, blurred vision, occasional nausea but no vomiting.  May also have rhinorrhea or nasal congestion which made her think that it may be allergies.  She has a new dog in the house. Lately, bending over during the HA or coughing or sneezing worsens the paid and it feels like her head would explode.  She checks her blood pressure during these episodes and blood pressures have been normal.  The headaches recently have been lasting at least a day.   -At their worst they are 10/10 pain.  - She takes a tbsp of vinegar which provides minimal relief. Laying down in the dark may help. Took Tylneol once, unsure if it helped. She avoids any NSAIDs.    Has seen an Allergist in the past that found food allergies such as cheddar cheese, which could worsen or elicit the headaches, though inconsistently so she does not avoid these foods.  She has never  seen a neurologist for these sx or been prescribed any medications for them in the past.  T2DM:  A1c today 6.1. Down from 6.5 at last visit. She was prescribed Ozempic  at last visit 0.25 mg, but was unable to afford. She does not have any current insurance, but was approved for family planning medicaid.  Still taking metformin  500 mg BID. Has had her recent diabetic eye exam. Diet: Continues to eat a lot of fast foods.  Every other day soda/tea. Drinking more water. Exercise: She walks 3x a week with son. Feels more motivation lately. Lost 4 pounds since 10/14/2023.  Depression   At previous visit she felt depressed and unmotivated, impacting her physical activity and eating habits. At this time she feels her depression sx are stable and controlled on Sertraline . But continues to have bad hot flashes.    HTN  Her blood pressure today is 110/76. She continues to take losartan  25 mg daily.  Hypothyroid  She continues to take levothyroxine  50 mcg. Last TSH was 3.18 about 1 year ago  HLD:  Atorvastatin  40 mg well tolerated.   CKD 3a: She avoids NSAIDs.  Health maintenance: Wants her Influenza shot today. Declines Zoster vaccine (shingles).   Patient Active Problem List   Diagnosis Date Noted   Hyperlipidemia associated with type 2 diabetes mellitus (HCC) 11/03/2019   Class 1 obesity due to excess calories with serious comorbidity and  body mass index (BMI) of 32.0 to 32.9 in adult 11/03/2019   Floaters in visual field, right 11/15/2018   Onychomycosis 08/15/2018   Achilles tendinitis of right lower extremity 08/15/2018   Hot flashes 02/23/2017   Chronic low back pain 04/12/2015   Dry skin 04/12/2015   Chronic dryness of both eyes 08/28/2014   DM2 (diabetes mellitus, type 2) (HCC) 03/19/2014   Hypothyroidism 03/19/2014   Hypertension 03/19/2014   Hyperlipidemia 03/19/2014   Depression 03/19/2014     Current Outpatient Medications on File Prior to Visit  Medication Sig Dispense  Refill   acetaminophen -codeine  (TYLENOL  #3) 300-30 MG tablet Take 2 tablets by mouth every 8 (eight) hours as needed for moderate pain. 60 tablet 0   atorvastatin  (LIPITOR) 80 MG tablet Take 0.5 tablets (40 mg total) by mouth daily.     cycloSPORINE  (RESTASIS ) 0.05 % ophthalmic emulsion Instill 1 drop in both eyes twice a day 60 each 3   diclofenac  sodium (VOLTAREN ) 1 % GEL Apply 2 g topically 4 (four) times daily. 100 g 1   glucose blood (TRUE METRIX BLOOD GLUCOSE TEST) test strip Use as instructed to check blood sugar up to 3 times daily. 100 each 2   levothyroxine  (SYNTHROID ) 50 MCG tablet Take 1 tablet (50 mcg total) by mouth daily.Please make an appointment. 90 tablet 1   Lidocaine  (HM LIDOCAINE  PATCH) 4 % PTCH Apply 1 patch topically every 12 (twelve) hours as needed. 30 patch 0   losartan  (COZAAR ) 25 MG tablet Take 1 tablet (25 mg total) by mouth daily. 90 tablet 1   Multiple Vitamin (MULTIVITAMIN ADULT PO) Take by mouth.     Semaglutide ,0.25 or 0.5MG /DOS, 2 MG/3ML SOPN Inject 0.25 mg into the skin once a week. 3 mL 0   TRUEPLUS LANCETS 28G MISC Use to check blood sugar up to 3 times daily. 100 each 2   No current facility-administered medications on file prior to visit.    Allergies  Allergen Reactions   Zithromax [Azithromycin Dihydrate]     Social History   Socioeconomic History   Marital status: Married    Spouse name: Not on file   Number of children: 2   Years of education: Not on file   Highest education level: Bachelor's degree (e.g., BA, AB, BS)  Occupational History   Occupation: Tool crib assistant  Tobacco Use   Smoking status: Never   Smokeless tobacco: Never  Vaping Use   Vaping status: Never Used  Substance and Sexual Activity   Alcohol use: No   Drug use: No   Sexual activity: Not Currently    Birth control/protection: Surgical  Other Topics Concern   Not on file  Social History Narrative   Lives with husband who has diabetes.    Social Drivers of  Health   Financial Resource Strain: Medium Risk (02/13/2024)   Overall Financial Resource Strain (CARDIA)    Difficulty of Paying Living Expenses: Somewhat hard  Food Insecurity: No Food Insecurity (02/13/2024)   Hunger Vital Sign    Worried About Running Out of Food in the Last Year: Never true    Ran Out of Food in the Last Year: Never true  Transportation Needs: No Transportation Needs (02/13/2024)   PRAPARE - Administrator, Civil Service (Medical): No    Lack of Transportation (Non-Medical): No  Physical Activity: Insufficiently Active (02/13/2024)   Exercise Vital Sign    Days of Exercise per Week: 3 days    Minutes of Exercise  per Session: 30 min  Stress: No Stress Concern Present (02/13/2024)   [employer] of Occupational Health - Occupational Stress Questionnaire    Feeling of Stress: Only a little  Social Connections: Moderately Integrated (02/13/2024)   Social Connection and Isolation Panel    Frequency of Communication with Friends and Family: Once a week    Frequency of Social Gatherings with Friends and Family: Once a week    Attends Religious Services: More than 4 times per year    Active Member of [employer] or Organizations: Yes    Attends Banker Meetings: More than 4 times per year    Marital Status: Married  Catering manager Violence: Unknown (08/03/2017)   Humiliation, Afraid, Rape, and Kick questionnaire    Fear of Current or Ex-Partner: Patient declined    Emotionally Abused: Patient declined    Physically Abused: Patient declined    Sexually Abused: Patient declined    Family History  Problem Relation Age of Onset   Hypertension Mother    Stomach cancer Mother    Alzheimer's disease Sister    Colon cancer Neg Hx    Colon polyps Neg Hx    Esophageal cancer Neg Hx    Rectal cancer Neg Hx     Past Surgical History:  Procedure Laterality Date   CESAREAN SECTION     MYOMECTOMY     x2-3   TUBAL LIGATION     at c-section     ROS: Review of Systems Negative except as stated above  PHYSICAL EXAM: BP 110/76 (BP Location: Left Arm, Patient Position: Sitting, Cuff Size: Normal)   Pulse 72   Temp 98.3 F (36.8 C) (Oral)   Ht 5' 6 (1.676 m)   Wt 202 lb (91.6 kg)   SpO2 98%   BMI 32.60 kg/m   Physical Exam   General appearance: alert, cooperative, and appears stated age Head: Normocephalic, without obvious abnormality, atraumatic Neck: thyroid  not enlarged, symmetric, no tenderness/mass/nodules no TTP at back of the neck  Heart: regular rate and rhythm, S1, S2 normal, no murmur, click, rub or gallop Extremities: extremities normal, atraumatic, no cyanosis or edema Neurologic: Mental status: Alert, oriented, thought content appropriate Cranial nerves: II: pupils equal, round, reactive to light and accommodation, direct pupil reaction to light bilaterally, V: facial light touch sensation normal bilaterally Sensory: normal Motor: Strength 5/5 in UE and LE Reflexes: 2+ and symmetric Coordination: cerebellar arm drift absent bilaterally Gait: Normal   Results for orders placed or performed in visit on 02/17/24  POCT glycosylated hemoglobin (Hb A1C)   Collection Time: 02/17/24 11:43 AM  Result Value Ref Range   Hemoglobin A1C     HbA1c POC (<> result, manual entry)     HbA1c, POC (prediabetic range)     HbA1c, POC (controlled diabetic range) 6.1 0.0 - 7.0 %  POCT glucose (manual entry)   Collection Time: 02/17/24 11:43 AM  Result Value Ref Range   POC Glucose 112 (A) 70 - 99 mg/dl        Latest Ref Rng & Units 04/15/2023    2:02 PM 04/02/2022   12:25 PM 09/03/2021    1:29 PM  CMP  Glucose 70 - 99 mg/dL 97  96  834   BUN 8 - 27 mg/dL 13  16  15    Creatinine 0.57 - 1.00 mg/dL 8.82  8.86  8.82   Sodium 134 - 144 mmol/L 141  142  138   Potassium 3.5 - 5.2 mmol/L 4.8  5.1  3.7   Chloride 96 - 106 mmol/L 104  104  104   CO2 20 - 29 mmol/L 26  23  27    Calcium  8.7 - 10.3 mg/dL 89.8  89.7  9.6    Total Protein 6.0 - 8.5 g/dL 7.4  7.6  8.2   Total Bilirubin 0.0 - 1.2 mg/dL 0.3  0.3  0.6   Alkaline Phos 44 - 121 IU/L 92  96  72   AST 0 - 40 IU/L 20  15  22    ALT 0 - 32 IU/L 17  17  21     Lipid Panel     Component Value Date/Time   CHOL 167 04/15/2023 1402   TRIG 112 04/15/2023 1402   HDL 44 04/15/2023 1402   CHOLHDL 3.8 04/15/2023 1402   CHOLHDL 4.3 12/19/2015 1620   VLDL 20 12/19/2015 1620   LDLCALC 103 (H) 04/15/2023 1402    CBC    Component Value Date/Time   WBC 4.6 04/02/2022 1225   WBC 5.9 09/03/2021 1305   RBC 4.41 04/02/2022 1225   RBC 4.35 09/03/2021 1305   HGB 12.5 04/02/2022 1225   HCT 39.6 04/02/2022 1225   PLT 278 04/02/2022 1225   MCV 90 04/02/2022 1225   MCH 28.3 04/02/2022 1225   MCH 28.7 09/03/2021 1305   MCHC 31.6 04/02/2022 1225   MCHC 31.9 09/03/2021 1305   RDW 13.6 04/02/2022 1225       10/14/2023    1:43 PM 04/15/2023    1:40 PM 09/21/2022   10:37 AM  PHQ9 SCORE ONLY  PHQ-9 Total Score 4  1  5       Data saved with a previous flowsheet row definition    ASSESSMENT AND PLAN:  Assessment and Plan  Migraine with aura Her chronic throbbing headaches are most likely migraines.  Recent change in character of the headache mainly increased frequency and her age warrants imaging.  We discussed getting a CAT scan or MRI head but we will hold off for several weeks to give her time to try to get approved for regular Medicaid or [facility] discount.  Advised to let me know via Mychart once approved. Neurological exam reassuring at this time. - Started Imitrex  50 mg PRN as abortive therapy.  I went over with her how to use.  Ideally I would like to try her with [place] but she would not be able to afford. - Started Topimax 25 mg daily for migraine prophylaxis  - Advised of the importance of getting at least 7 to 8 hours of sleep at night and trying to eat healthy balanced meals.  Avoid foods that trigger her migraines.  Type 2 diabetes mellitus with  obesity (HCC) (Primary) Discussed and encouraged healthy eating habits and continued exercise. Will continue metformin  500 mg BID at this time. - POCT glycosylated hemoglobin (Hb A1C) - POCT glucose (manual entry) - Discussed starting her on Semaglutide  in future if she can get approved for medicaid  - CMP   Diabetes mellitus treated with oral medication (HCC) See #1 above   Anxiety and Depression Depression appears to be well controlled on Sertraline , though do not help with her hot flashes.  - Continue sertraline  100 mg daily   Hypertension associated with diabetes (HCC) At goal.  Continue Cozaar  25 mg daily.   Hyperlipidemia associated with type 2 diabetes mellitus (HCC) Continue Atorvastatin  40 mg daily. - Lipid panel    Acquired hypothyroidism Continue levothyroxine  50 mcg daily.  Last TSH level was within normal range.  - TSH   Stage 3a chronic kidney disease (HCC) We will continue to monitor.  Avoid NSAIDs. - CMP as above  Health maintenance Flu vaccine given today  Herpes zoster vaccination declined   Encounter for screening mammogram for malignant neoplasm of breast - MM 3D SCREENING MAMMOGRAM BILATERAL BREAST; Future   Patient was given the opportunity to ask questions.  Patient verbalized understanding of the plan and was able to repeat key elements of the plan.    Orders Placed This Encounter  Procedures   Flu vaccine HIGH DOSE PF(Fluzone Trivalent)   TSH   Comprehensive metabolic panel   Lipid Panel   POCT glycosylated hemoglobin (Hb A1C)   POCT glucose (manual entry)     Requested Prescriptions   Signed Prescriptions Disp Refills   metFORMIN  (GLUCOPHAGE -XR) 500 MG 24 hr tablet 180 tablet 1    Sig: Take 2 tablets (1,000 mg total) by mouth daily.   metoprolol  succinate (TOPROL -XL) 25 MG 24 hr tablet 90 tablet 1    Sig: Take 1 tablet (25 mg total) by mouth daily.   sertraline  (ZOLOFT ) 100 MG tablet 90 tablet 1    Sig: Take 1 tablet (100 mg total)  by mouth daily.   SUMAtriptan  (IMITREX ) 50 MG tablet 10 tablet 2    Sig: 1 tablet by mouth at start of headache may repeat x1 in 2 hours if headache persists. Max 2 tablets/24hrs   topiramate  (TOPAMAX ) 25 MG tablet 30 tablet 1    Sig: Take 1 tablet (25 mg total) by mouth daily. Take at bedtime   Blood Pressure Monitor DEVI 1 each 0    Sig: Use as directed to check home blood pressure 2-3 times a week    Return in about 2 months (around 04/18/2024) for Headaches.  This is a Psychologist, occupational Note.  The care of the patient was discussed with Dr. Vicci and the assessment and plan formulated with their assistance.  Please see their attached note for official documentation of the daily encounter.  Gaines, Megan J, Medical Student 02/17/2024, 1:15 PM  Evaluation and management procedures were performed by me with Medical Student in attendance, note written by Medical Student under my supervision and collaboration. I have reviewed the note and I agree with the management and plan.   Barnie Vicci, MD, FAAFP. [facility] [place], [place] [phone]   02/17/2024, 1:47 PM

## 2024-02-17 NOTE — Patient Instructions (Signed)
 I have prescribed Imitrex  50 mg for you to take only when you feel your migraines coming on. Don't take more than 2 in a day.  I have also prescribed propanolol 10 mg for you to take daily to prevent headaches.   Work on Health visitor coverage and let us  know if you can get that coverage for head imaging.

## 2024-02-21 ENCOUNTER — Other Ambulatory Visit: Payer: Self-pay

## 2024-02-21 ENCOUNTER — Ambulatory Visit: Attending: Internal Medicine

## 2024-02-22 LAB — LIPID PANEL
Chol/HDL Ratio: 3.9 ratio (ref 0.0–4.4)
Cholesterol, Total: 159 mg/dL (ref 100–199)
HDL: 41 mg/dL (ref >39)
LDL Chol Calc (NIH): 96 mg/dL (ref 0–99)
Triglycerides: 123 mg/dL (ref 0–149)
VLDL Cholesterol Cal: 22 mg/dL (ref 5–40)

## 2024-02-22 LAB — COMPREHENSIVE METABOLIC PANEL WITH GFR
ALT: 16 IU/L (ref 0–32)
AST: 14 IU/L (ref 0–40)
Albumin: 4.6 g/dL (ref 3.9–4.9)
Alkaline Phosphatase: 104 IU/L (ref 44–121)
BUN/Creatinine Ratio: 11 — ABNORMAL LOW (ref 12–28)
BUN: 13 mg/dL (ref 8–27)
Bilirubin Total: 0.3 mg/dL (ref 0.0–1.2)
CO2: 20 mmol/L (ref 20–29)
Calcium: 10.3 mg/dL (ref 8.7–10.3)
Chloride: 105 mmol/L (ref 96–106)
Creatinine, Ser: 1.19 mg/dL — ABNORMAL HIGH (ref 0.57–1.00)
Globulin, Total: 2.9 g/dL (ref 1.5–4.5)
Glucose: 101 mg/dL — ABNORMAL HIGH (ref 70–99)
Potassium: 5 mmol/L (ref 3.5–5.2)
Sodium: 142 mmol/L (ref 134–144)
Total Protein: 7.5 g/dL (ref 6.0–8.5)
eGFR: 51 mL/min/1.73 — ABNORMAL LOW (ref >59)

## 2024-02-22 LAB — TSH: TSH: 2.97 u[IU]/mL (ref 0.450–4.500)

## 2024-02-23 ENCOUNTER — Ambulatory Visit: Payer: Self-pay | Admitting: Internal Medicine

## 2024-02-28 ENCOUNTER — Other Ambulatory Visit: Payer: Self-pay

## 2024-03-03 ENCOUNTER — Encounter: Payer: Self-pay | Admitting: Internal Medicine

## 2024-03-06 ENCOUNTER — Other Ambulatory Visit: Payer: Self-pay

## 2024-03-06 ENCOUNTER — Other Ambulatory Visit: Payer: Self-pay | Admitting: Internal Medicine

## 2024-03-06 DIAGNOSIS — G43109 Migraine with aura, not intractable, without status migrainosus: Secondary | ICD-10-CM

## 2024-03-06 MED ORDER — TOPIRAMATE 50 MG PO TABS
50.0000 mg | ORAL_TABLET | Freq: Every day | ORAL | 2 refills | Status: DC
  Filled 2024-03-06: qty 30, 30d supply, fill #0
  Filled 2024-04-14: qty 30, 30d supply, fill #1
  Filled 2024-05-06: qty 30, 30d supply, fill #2

## 2024-03-07 ENCOUNTER — Other Ambulatory Visit: Payer: Self-pay

## 2024-03-08 NOTE — Telephone Encounter (Signed)
 Hey Dr.Johnson,   So our office is unable to attain an estimate. I do know that when patient's schedule an appointment they can also request an estimate at that time. You may have to place the order and once they schedule the patient she can request that estimate then.

## 2024-03-10 ENCOUNTER — Other Ambulatory Visit: Payer: Self-pay

## 2024-03-10 ENCOUNTER — Other Ambulatory Visit: Payer: Self-pay | Admitting: Internal Medicine

## 2024-03-10 DIAGNOSIS — G43109 Migraine with aura, not intractable, without status migrainosus: Secondary | ICD-10-CM

## 2024-03-22 ENCOUNTER — Ambulatory Visit (HOSPITAL_COMMUNITY)
Admission: RE | Admit: 2024-03-22 | Discharge: 2024-03-22 | Disposition: A | Discharge status: Home / Self Care | Source: Ambulatory Visit | Attending: Internal Medicine | Admitting: Internal Medicine

## 2024-03-22 DIAGNOSIS — G43109 Migraine with aura, not intractable, without status migrainosus: Secondary | ICD-10-CM | POA: Diagnosis present

## 2024-03-22 DIAGNOSIS — R519 Headache, unspecified: Secondary | ICD-10-CM

## 2024-03-22 MED ORDER — GADOBUTROL 1 MMOL/ML IV SOLN
10.0000 mL | Freq: Once | INTRAVENOUS | Status: AC | PRN
  Administered 2024-03-22: 10 mL via INTRAVENOUS

## 2024-03-23 ENCOUNTER — Ambulatory Visit: Payer: Self-pay | Admitting: Internal Medicine

## 2024-03-29 ENCOUNTER — Telehealth: Payer: Self-pay

## 2024-03-29 ENCOUNTER — Other Ambulatory Visit: Payer: Self-pay | Admitting: Obstetrics and Gynecology

## 2024-03-29 ENCOUNTER — Other Ambulatory Visit: Payer: Self-pay

## 2024-03-29 ENCOUNTER — Encounter: Payer: Self-pay | Admitting: Internal Medicine

## 2024-03-29 DIAGNOSIS — N6320 Unspecified lump in the left breast, unspecified quadrant: Secondary | ICD-10-CM

## 2024-03-29 NOTE — Telephone Encounter (Addendum)
 Patient offered 10:15 am BCCCP appointment and accepted.   Patient called and stated she has left breast lump, possibly boil that occurred x 2-3 days ago. Patient stated the area is red, feels warm to touch, but denies any discharge, fever, and feels ok otherwise. Patient stated the lump does feel like it has some fluid inside, and she has messed with it, may have caused some irritation. Patient stated her screening mammogram was normal x 2-3 months ago. Patient also has a history of DM type 2. Patient informed needs to be seen in [facility], will check with manager and scheduler to see if able to be seen tomorrow, will call back later today. Patient verbalized understanding.

## 2024-03-29 NOTE — Progress Notes (Signed)
 I received note from [employer] associated with [facility] stating that MRI of brain was approved. Approval # [id number]. Case # [id number]. Review date 03/20/24. Exp date 05/04/2024.

## 2024-03-30 ENCOUNTER — Other Ambulatory Visit: Payer: Self-pay | Admitting: Obstetrics and Gynecology

## 2024-03-30 ENCOUNTER — Ambulatory Visit

## 2024-03-30 VITALS — BP 109/84 | Wt 199.0 lb

## 2024-03-30 DIAGNOSIS — N6320 Unspecified lump in the left breast, unspecified quadrant: Secondary | ICD-10-CM

## 2024-03-30 DIAGNOSIS — N6324 Unspecified lump in the left breast, lower inner quadrant: Secondary | ICD-10-CM

## 2024-03-30 DIAGNOSIS — Z1239 Encounter for other screening for malignant neoplasm of breast: Secondary | ICD-10-CM

## 2024-03-30 DIAGNOSIS — N644 Mastodynia: Secondary | ICD-10-CM

## 2024-03-30 NOTE — Patient Instructions (Signed)
 Explained breast self awareness with Heather Bailey. Patient did not need a Pap smear today due to last Pap smear and HPV typing was 09/05/2019. Let her know BCCCP will cover Pap smears and HPV typing every 5 years unless has a history of abnormal Pap smears. Referred patient to the [facility] for a diagnostic mammogram. Appointment scheduled Friday, March 31, 2024 at 0800.Patient aware of appointment and will be there. Heather Bailey verbalized understanding.  Anjelique Makar, Wanda Ship, RN 10:39 AM

## 2024-03-30 NOTE — Progress Notes (Signed)
 [name] is a 63 y.o. female who presents to [facility] today with complaint of left breast lump, redness, and pain x 1 week. Patient states the pain comes and goes. Patient rates the pain at a 4 out of 10.    Pap Smear: Pap smear not completed today. Last Pap smear was 09/05/2019 at [facility] and was normal with negative HPV. Per patient had an abnormal Pap smear in 2012 that required a colposcopy for follow up 10/06/2010. Per patient has a history of around 3 other abnormal Pap smears prior to the abnormal Pap smear in 2012. Patient stated she has a history of cryo and coloposcopy prior to last abnormal Pap smear.  Last Pap smear result is available in Epic.   Physical exam: Breasts Breasts symmetrical. No skin abnormalities right breast. Observed a reddened area within the left lower breast under the areola that measures 3 cm x 2 cm. No nipple retraction bilateral breasts. No nipple discharge bilateral breasts. No lymphadenopathy. No lumps palpated right breast. Palpated a lump within the left lower breast at 7 o'clock 3 cm from the nipple. Complaints of tenderness when palpated left breast lump.  MS 3D SCR MAMMO BILAT BR (aka MM) Result Date: 12/14/2023 CLINICAL DATA:  Screening. EXAM: DIGITAL SCREENING BILATERAL MAMMOGRAM WITH TOMOSYNTHESIS AND CAD TECHNIQUE: Bilateral screening digital craniocaudal and mediolateral oblique mammograms were obtained. Bilateral screening digital breast tomosynthesis was performed. The images were evaluated with computer-aided detection. COMPARISON:  Previous exam(s). ACR Breast Density Category b: There are scattered areas of fibroglandular density. FINDINGS: There are no findings suspicious for malignancy. IMPRESSION: No mammographic evidence of malignancy. A result letter of this screening mammogram will be mailed directly to the patient. RECOMMENDATION: Screening mammogram in one year. (Code:SM-B-01Y) BI-RADS CATEGORY  1: Negative. Electronically Signed    By: Dirk Arrant M.D.   On: 12/14/2023 08:38   MS DIGITAL SCREENING TOMO BILATERAL Result Date: 09/05/2019 CLINICAL DATA:  Screening. EXAM: DIGITAL SCREENING BILATERAL MAMMOGRAM WITH TOMO AND CAD COMPARISON:  Previous exam(s). ACR Breast Density Category b: There are scattered areas of fibroglandular density. FINDINGS: There are no findings suspicious for malignancy. Images were processed with CAD. IMPRESSION: No mammographic evidence of malignancy. A result letter of this screening mammogram will be mailed directly to the patient. RECOMMENDATION: Screening mammogram in one year. (Code:SM-B-01Y) BI-RADS CATEGORY  1: Negative. Electronically Signed   By: Inocente Ast M.D.   On: 09/05/2019 15:30   Pelvic/Bimanual Pap is not indicated today per BCCCP guidelines.   Smoking History: Patient has never smoked.   Patient Navigation: Patient education provided. Access to services provided for patient through [facility] program.   Colorectal Cancer Screening: Per patient has had colonoscopy completed on 10/02/2021. No complaints today.    Breast and Cervical Cancer Risk Assessment: Patient does not have family history of breast cancer, known genetic mutations, or radiation treatment to the chest before age 66. Per patient has history of cervical dysplasia. Patient has no history of being immunocompromised or DES exposure in-utero.  Risk Scores as of Encounter on 03/30/2024     Alisa           5-year 1.59%   Lifetime 6.34%            Last calculated by Silas, Ansyi K, CMA on 03/30/2024 at 10:35 AM        A: BCCCP exam without pap smear Complaint of left breast lump, redness, and pain.  P: Referred patient to the [facility] for a diagnostic mammogram. Appointment scheduled Friday, March 31, 2024 at 0800.  Driscilla Wanda SQUIBB, RN 03/30/2024 10:39 AM

## 2024-03-31 ENCOUNTER — Ambulatory Visit
Admission: RE | Admit: 2024-03-31 | Discharge: 2024-03-31 | Disposition: A | Discharge status: Home / Self Care | Source: Ambulatory Visit | Attending: Obstetrics and Gynecology | Admitting: Obstetrics and Gynecology

## 2024-03-31 ENCOUNTER — Ambulatory Visit
Admission: RE | Admit: 2024-03-31 | Discharge: 2024-03-31 | Disposition: A | Source: Ambulatory Visit | Attending: Obstetrics and Gynecology | Admitting: Obstetrics and Gynecology

## 2024-03-31 DIAGNOSIS — N6320 Unspecified lump in the left breast, unspecified quadrant: Secondary | ICD-10-CM

## 2024-04-11 ENCOUNTER — Other Ambulatory Visit

## 2024-04-11 ENCOUNTER — Encounter

## 2024-04-12 ENCOUNTER — Other Ambulatory Visit: Payer: Self-pay

## 2024-04-14 ENCOUNTER — Other Ambulatory Visit: Payer: Self-pay

## 2024-04-14 ENCOUNTER — Inpatient Hospital Stay: Attending: Gynecologic Oncology | Admitting: Gynecologic Oncology

## 2024-04-14 ENCOUNTER — Encounter: Payer: Self-pay | Admitting: Gynecologic Oncology

## 2024-04-14 VITALS — BP 113/68 | HR 71 | Temp 97.4°F | Resp 19 | Wt 200.2 lb

## 2024-04-14 DIAGNOSIS — K769 Liver disease, unspecified: Secondary | ICD-10-CM

## 2024-04-14 DIAGNOSIS — N83209 Unspecified ovarian cyst, unspecified side: Secondary | ICD-10-CM | POA: Diagnosis present

## 2024-04-14 NOTE — Progress Notes (Signed)
 Gynecologic Oncology Return Clinic Visit  04/14/24  Reason for Visit: follow-up  Treatment History: The patient reports being followed for at least the last 3-4 years for cyst on her ovaries.  At the time that this was initially found, she did not have insurance.  Now she has insurance and is following with her OB/GYN regularly.   Pelvic ultrasound exam performed on 04/30/2021 at [facility] showed a uterus measuring 6.3 x 6.3 x 4.1 cm with an endometrial lining of 7.3 mm.  Left ovary measures up to 5.7 cm in size and right up to 5.2 cm in size.  Multiple cysts seen in bilateral ovaries, largest on the right measures up to 3.2 cm and on the left measures up to 3.5 cm.   Tumor markers were performed on 11/20/2021.  CEA was 1.9, CA 19-9 was <2 and CA-125 was mildly elevated at 54.9. Pelvic ultrasound exam at [facility] on 11/20/2021 showed a uterus measuring 5.8 x 6.8 x 4.3 cm with an endometrial lining of 3.1 mm.  Left ovary measures 5.3 x 4.3 x 4 cm.  Right ovary measures 4.4 x 3.3 x 2.6 cm.  There are multiple simple cyst seen bilaterally, the 3 largest on the right measuring up to 2.8 cm, along with a small projection within the cyst.  Left also with multiple simple cysts, the largest measuring up to 2.8 cm.  Multiple fibroids noted within the uterus, largest measuring up to 2.9 cm.  Small amount of free fluid noted within the cul-de-sac.   Pelvic ultrasound 12/2021: 1. Multiple uterine fibroids. 2. Bilateral simple ovarian cysts. Multiple ovarian cysts, largest 4.3 centimeters. Recommend follow-up pelvic US  in 3-6 months. Note: This recommendation does not apply to premenarchal patients or to those with increased risk (genetic, family history, elevated tumor markers or other high-risk factors) of ovarian cancer. Reference: Radiology 2019 Nov; 293(2):359-371.   Pelvic ultrasound 04/2022: 1. Uterine fibroids, described above. 2. Findings suggest bilateral hydrosalpinx. No definite  interval change, but comparison between the studies is difficult due to interoperator differences. 3. Endometrium not visualized. 4. The sonographic findings are indeterminate, and MRI of the pelvis might be helpful for further evaluation.   Pelvic MRI 08/2022: 1. Enlarged bilateral ovaries containing multiple cysts, measuring up to 3.1 cm on the left and 5.5 cm on the right. Apparent mural thickening along the posterolateral right ovary does not demonstrate associated restricted diffusion or definite enhancement and is favored to reflect a benign process. A 4 mm focus of nodular enhancement is seen along the posterior left ovary, which is indeterminate but may be artifactual related to volume averaging. Continued follow-up pelvic ultrasound in 6-12 months can be considered to assess for interval change, as clinically indicated. 2. Multiple intrauterine masses measuring up to 2.7 cm, likely leiomyomas. The endometrium is distorted, measuring 4 mm.   Pelvic ultrasound 03/2023: 1. No significant interval change in size or appearance of septated cystic lesions within the bilateral ovaries. As noted on previous MR there are multiple irregular internal areas of septation, some of which appear thickened and containing increased blood flow. Findings are concerning for benign or malignant ovarian neoplasms. 2. Small uterine fibroids. 3. Sub optimal visualization of the endometrial stripe which by me best estimate measures 8 mm. On the previous MRI there are no signs of endometrial thickening.  Interval History: Doing well.  Denies any vaginal bleeding.  Continues to have some left back pain, unchanged since the last time I saw her.  Endorses  alternating intermittently between constipation and diarrhea at baseline.  Has some urinary frequency.  Past Medical/Surgical History: Past Medical History:  Diagnosis Date   Anemia Dx 2003   Arthritis Dx 1999   DVT (deep venous thrombosis) (HCC) 2007   got Lupron  to shrink fibroids, developed DVT after (blood thinner for 6 months)   Fibroids    Gestational diabetes    Hyperlipidemia Dx 2000   Hypertension Dx 2000   Hypertensive retinopathy    Thyroid  disease Dx 2005    Past Surgical History:  Procedure Laterality Date   CESAREAN SECTION     MYOMECTOMY     x2-3   TUBAL LIGATION     at c-section    Family History  Problem Relation Age of Onset   Hypertension Mother    Stomach cancer Mother    Alzheimer's disease Sister    Colon cancer Neg Hx    Colon polyps Neg Hx    Esophageal cancer Neg Hx    Rectal cancer Neg Hx     Social History   Socioeconomic History   Marital status: Married    Spouse name: Not on file   Number of children: 2   Years of education: Not on file   Highest education level: Bachelor's degree (e.g., BA, AB, BS)  Occupational History   Occupation: Tool crib assistant  Tobacco Use   Smoking status: Never   Smokeless tobacco: Never  Vaping Use   Vaping status: Never Used  Substance and Sexual Activity   Alcohol use: No   Drug use: No   Sexual activity: Not Currently    Birth control/protection: Surgical  Other Topics Concern   Not on file  Social History Narrative   Lives with husband who has diabetes.    Social Drivers of Health   Financial Resource Strain: Medium Risk (02/13/2024)   Overall Financial Resource Strain (CARDIA)    Difficulty of Paying Living Expenses: Somewhat hard  Food Insecurity: No Food Insecurity (03/30/2024)   Hunger Vital Sign    Worried About Running Out of Food in the Last Year: Never true    Ran Out of Food in the Last Year: Never true  Transportation Needs: No Transportation Needs (03/30/2024)   PRAPARE - Administrator, Civil Service (Medical): No    Lack of Transportation (Non-Medical): No  Physical Activity: Insufficiently Active (02/13/2024)   Exercise Vital Sign    Days of Exercise per Week: 3 days    Minutes of Exercise per Session: 30 min  Stress:  No Stress Concern Present (02/13/2024)   [employer] of Occupational Health - Occupational Stress Questionnaire    Feeling of Stress: Only a little  Social Connections: Moderately Integrated (02/13/2024)   Social Connection and Isolation Panel    Frequency of Communication with Friends and Family: Once a week    Frequency of Social Gatherings with Friends and Family: Once a week    Attends Religious Services: More than 4 times per year    Active Member of [employer] or Organizations: Yes    Attends Engineer, Structural: More than 4 times per year    Marital Status: Married    Current Medications:  Current Outpatient Medications:    atorvastatin  (LIPITOR) 80 MG tablet, Take 0.5 tablets (40 mg total) by mouth daily., Disp: , Rfl:    Blood Pressure Monitor DEVI, Use as directed to check home blood pressure 2-3 times a week, Disp: 1 each, Rfl: 0   glucose  blood (TRUE METRIX BLOOD GLUCOSE TEST) test strip, Use as instructed to check blood sugar up to 3 times daily., Disp: 100 each, Rfl: 2   levothyroxine  (SYNTHROID ) 50 MCG tablet, Take 1 tablet (50 mcg total) by mouth daily.Please make an appointment., Disp: 90 tablet, Rfl: 1   losartan  (COZAAR ) 25 MG tablet, Take 1 tablet (25 mg total) by mouth daily., Disp: 90 tablet, Rfl: 1   metFORMIN  (GLUCOPHAGE -XR) 500 MG 24 hr tablet, Take 2 tablets (1,000 mg total) by mouth daily., Disp: 180 tablet, Rfl: 1   metoprolol  succinate (TOPROL -XL) 25 MG 24 hr tablet, Take 1 tablet (25 mg total) by mouth daily., Disp: 90 tablet, Rfl: 1   sertraline  (ZOLOFT ) 100 MG tablet, Take 1 tablet (100 mg total) by mouth daily., Disp: 90 tablet, Rfl: 1   SUMAtriptan  (IMITREX ) 50 MG tablet, 1 tablet by mouth at start of headache may repeat x1 in 2 hours if headache persists. Max 2 tablets/24hrs (Patient taking differently: 1 tablet by mouth at start of headache may repeat x1 in 2 hours if headache persists. Max 2 tablets/24hrs as needed), Disp: 10 tablet, Rfl: 2    topiramate  (TOPAMAX ) 50 MG tablet, Take 1 tablet (50 mg total) by mouth daily. Take at bedtime, Disp: 30 tablet, Rfl: 2   TRUEPLUS LANCETS 28G MISC, Use to check blood sugar up to 3 times daily., Disp: 100 each, Rfl: 2   acetaminophen -codeine  (TYLENOL  #3) 300-30 MG tablet, Take 2 tablets by mouth every 8 (eight) hours as needed for moderate pain. (Patient not taking: Reported on 04/11/2024), Disp: 60 tablet, Rfl: 0   cycloSPORINE  (RESTASIS ) 0.05 % ophthalmic emulsion, Instill 1 drop in both eyes twice a day (Patient not taking: Reported on 04/12/2024), Disp: 60 each, Rfl: 3   diclofenac  sodium (VOLTAREN ) 1 % GEL, Apply 2 g topically 4 (four) times daily. (Patient not taking: Reported on 04/12/2024), Disp: 100 g, Rfl: 1   Lidocaine  (HM LIDOCAINE  PATCH) 4 % PTCH, Apply 1 patch topically every 12 (twelve) hours as needed. (Patient not taking: Reported on 04/12/2024), Disp: 30 patch, Rfl: 0   Multiple Vitamin (MULTIVITAMIN ADULT PO), Take by mouth. (Patient not taking: Reported on 04/12/2024), Disp: , Rfl:    Semaglutide ,0.25 or 0.5MG /DOS, 2 MG/3ML SOPN, Inject 0.25 mg into the skin once a week. (Patient not taking: Reported on 04/12/2024), Disp: 3 mL, Rfl: 0  Review of Systems: + Shortness of breath, hot flashes, joint pain, back pain, anxiety Denies appetite changes, fevers, chills, fatigue, unexplained weight changes. Denies hearing loss, neck lumps or masses, mouth sores, ringing in ears or voice changes. Denies cough or wheezing.  Denies chest pain or palpitations. Denies leg swelling. Denies abdominal distention, pain, blood in stools, constipation, diarrhea, nausea, vomiting, or early satiety. Denies pain with intercourse, dysuria, frequency, hematuria or incontinence. Denies pelvic pain, vaginal bleeding or vaginal discharge.   Denies muscle pain/cramps. Denies itching, rash, or wounds. Denies dizziness, headaches, numbness or seizures. Denies swollen lymph nodes or glands, denies easy  bruising or bleeding. Denies depression, confusion, or decreased concentration.  Physical Exam: BP 113/68 (BP Location: Left Arm, Patient Position: Sitting)   Pulse 71   Temp (!) 97.4 F (36.3 C) (Oral)   Resp 19   Wt 200 lb 3.2 oz (90.8 kg)   SpO2 99%   BMI 32.31 kg/m  General: Alert, oriented, no acute distress.  HEENT: Normocephalic, atraumatic. Sclera anicteric.  Chest: Clear to auscultation bilaterally. No wheezes, rhonchi, or rales. Cardiovascular: Regular rate and rhythm,  no murmurs, rubs, or gallops.  Abdomen: Normoactive bowel sounds. Soft, nondistended, nontender to palpation. No masses or hepatosplenomegaly appreciated. No palpable fluid wave.  Well-healed Pfannenstiel incision. Extremities: Grossly normal range of motion. Warm, well perfused. No edema bilaterally.  Skin: No rashes or lesions.  Lymphatics: No cervical, supraclavicular, or inguinal adenopathy.  GU: Normal appearing external genitalia without erythema, excoriation, or lesions.  Speculum exam reveals no vaginal lesions, ectropion on the cervix.  Bimanual exam reveals moderately mobile uterus that moves in conjunction with fullness appreciated in the cul-de-sac bilaterally, smooth.    Laboratory & Radiologic Studies: None new  Assessment & Plan: Heather Bailey is a 63 y.o. woman with fibroid uterus, complex adnexal masses bilaterally.  Patient is overall doing well with no significant change in symptoms.  Exam has not changed significantly.  Given 1 year interval since last imaging, I suggested that we obtain new pelvic imaging to ensure no significant change to the appearance or size of her bilateral adnexal masses.  We discussed getting an ultrasound versus an MRI and for cost purposes, plan to get pelvic ultrasound.  Given liver lesions seen at the time of her pelvic MRI last year, MRI of the abdomen have been recommended.  We have previously discussed this but it was not scheduled.  Patient is amenable to  having MRI of the abdomen scheduled.  Regard to surgery, the patient thinks that she would like to move forward with scheduling surgery before the end of the year.  She is interested in conserving her uterus and cervix if possible.  Discussed that findings at the time of surgery may necessitate removal of her uterus and cervix or, if frozen section is concerning for malignancy, total hysterectomy may be indicated as well as other procedures such as peritoneal biopsies, omentectomy, or lymphadenectomy.  I reviewed that surgery would be done with robotic assistance.   The patient would like to move forward with scheduling surgery in December.  We will have a phone visit closer to the date of surgery to review surgery again in detail and risks.  Perioperative teaching will be done at that time.  28 minutes of total time was spent for this patient encounter, including preparation, face-to-face counseling with the patient and coordination of care, and documentation of the encounter.  Comer Dollar, MD  Division of Gynecologic Oncology  Department of Obstetrics and Gynecology  [facility]

## 2024-04-14 NOTE — Patient Instructions (Addendum)
 Plan on having an ultrasound and a MRI of the liver. You will be contacted with the results.  Plan on having a preop phone call closer to the surgery date with Dr. Viktoria and Eleanor NP or Darice RN to discuss the below instructions.  Preparing for your Surgery  Plan for surgery on May 17, 2024 with Dr. Comer Viktoria at [facility]. You will be scheduled for robotic assisted laparoscopic bilateral salpingo-oophorectomy (removal of both ovaries and fallopian tubes), possible robotic assisted laparoscopic total hysterectomy (removal of the uterus and cervix), possible laparotomy (larger incision on your abdomen if needed).  Pre-operative Testing -You will receive a phone call from presurgical testing at [facility] to arrange for a pre-operative appointment and lab work.  -Bring your insurance card, copy of an advanced directive if applicable, medication list  -At that visit, you will be asked to sign a consent for a possible blood transfusion in case a transfusion becomes necessary during surgery.  The need for a blood transfusion is rare but having consent is a necessary part of your care.     -You should not be taking blood thinners or aspirin  at least ten days prior to surgery unless instructed by your surgeon.  -Do not take supplements such as fish oil (omega 3), red yeast rice, turmeric before your surgery. STOP TAKING AT LEAST 10 DAYS BEFORE SURGERY. You want to avoid medications with aspirin  in them including headache powders such as BC or Goody's), Excedrin migraine.  -If you are taking a GLP-1 medication/injection (semaglutide ) such as Ozempic , Mounjaro, Wegovy , this needs to be held before surgery for at least 7 days before.  Day Before Surgery at Home -You will be asked to take in a light diet the day before surgery. You will be advised you can have clear liquids up until 3 hours before your surgery.    Eat a light diet the day before surgery.  Examples  including soups, broths, toast, yogurt, mashed potatoes.  AVOID GAS PRODUCING FOODS AND BEVERAGES. Things to avoid include carbonated beverages (fizzy beverages, sodas), raw fruits and raw vegetables (uncooked), or beans.   If your bowels are filled with gas, your surgeon will have difficulty visualizing your pelvic organs which increases your surgical risks.  Your role in recovery Your role is to become active as soon as directed by your doctor, while still giving yourself time to heal.  Rest when you feel tired. You will be asked to do the following in order to speed your recovery:  - Cough and breathe deeply. This helps to clear and expand your lungs and can prevent pneumonia after surgery.  - STAY ACTIVE WHEN YOU GET HOME. Do mild physical activity. Walking or moving your legs help your circulation and body functions return to normal. Do not try to get up or walk alone the first time after surgery.   -If you develop swelling on one leg or the other, pain in the back of your leg, redness/warmth in one of your legs, please call the office or go to the Emergency Room to have a doppler to rule out a blood clot. For shortness of breath, chest pain-seek care in the Emergency Room as soon as possible. - Actively manage your pain. Managing your pain lets you move in comfort. We will ask you to rate your pain on a scale of zero to 10. It is your responsibility to tell your doctor or nurse where and how much you hurt so your pain  can be treated.  Special Considerations -If you are diabetic, you may be placed on insulin after surgery to have closer control over your blood sugars to promote healing and recovery.  This does not mean that you will be discharged on insulin.  If applicable, your oral antidiabetics will be resumed when you are tolerating a solid diet.  -Your final pathology results from surgery should be available around one week after surgery and the results will be relayed to you when  available.  -FMLA forms can be faxed to [phone] and please allow 5-7 business days for completion.  Pain Management After Surgery -You will be prescribed your pain medication and bowel regimen medications before surgery so that you can have these available when you are discharged from the hospital. The pain medication is for use ONLY AFTER surgery and a new prescription will not be given.   -Make sure that you have Tylenol  and Ibuprofen IF YOU ARE ABLE TO TAKE THESE MEDICATIONS at home to use on a regular basis after surgery for pain control. We recommend alternating the medications every hour to six hours since they work differently and are processed in the body differently for pain relief.  -Review the attached handout on narcotic use and their risks and side effects.   Bowel Regimen -You will be prescribed Sennakot-S to take nightly to prevent constipation especially if you are taking the narcotic pain medication intermittently.  It is important to prevent constipation and drink adequate amounts of liquids. You can stop taking this medication when you are not taking pain medication and you are back on your normal bowel routine.  Risks of Surgery Risks of surgery are low but include bleeding, infection, damage to surrounding structures, re-operation, blood clots, and very rarely death.   Blood Transfusion Information (For the consent to be signed before surgery)  We will be checking your blood type before surgery so in case of emergencies, we will know what type of blood you would need.                                            WHAT IS A BLOOD TRANSFUSION?  A transfusion is the replacement of blood or some of its parts. Blood is made up of multiple cells which provide different functions. Red blood cells carry oxygen and are used for blood loss replacement. White blood cells fight against infection. Platelets control bleeding. Plasma helps clot blood. Other blood products are  available for specialized needs, such as hemophilia or other clotting disorders. BEFORE THE TRANSFUSION  Who gives blood for transfusions?  You may be able to donate blood to be used at a later date on yourself (autologous donation). Relatives can be asked to donate blood. This is generally not any safer than if you have received blood from a stranger. The same precautions are taken to ensure safety when a relative's blood is donated. Healthy volunteers who are fully evaluated to make sure their blood is safe. This is blood bank blood. Transfusion therapy is the safest it has ever been in the practice of medicine. Before blood is taken from a donor, a complete history is taken to make sure that person has no history of diseases nor engages in risky social behavior (examples are intravenous drug use or sexual activity with multiple partners). The donor's travel history is screened to minimize risk of transmitting  infections, such as malaria. The donated blood is tested for signs of infectious diseases, such as HIV and hepatitis. The blood is then tested to be sure it is compatible with you in order to minimize the chance of a transfusion reaction. If you or a relative donates blood, this is often done in anticipation of surgery and is not appropriate for emergency situations. It takes many days to process the donated blood. RISKS AND COMPLICATIONS Although transfusion therapy is very safe and saves many lives, the main dangers of transfusion include:  Getting an infectious disease. Developing a transfusion reaction. This is an allergic reaction to something in the blood you were given. Every precaution is taken to prevent this. The decision to have a blood transfusion has been considered carefully by your caregiver before blood is given. Blood is not given unless the benefits outweigh the risks.  AFTER SURGERY INSTRUCTIONS  Return to work: 4-6 weeks if applicable  Activity: 1. Be up and out of the  bed during the day.  Take a nap if needed.  You may walk up steps but be careful and use the hand rail.  Stair climbing will tire you more than you think, you may need to stop part way and rest.   2. No lifting or straining for 6 weeks over 10 pounds. No pushing, pulling, straining for 6 weeks.  3. No driving for 4-89 days when the following criteria have been met: Do not drive if you are taking narcotic pain medicine and make sure that your reaction time has returned.   4. You can shower as soon as the next day after surgery. Shower daily.  Use your regular soap and water (not directly on the incision) and pat your incision(s) dry afterwards; don't rub.  No tub baths or submerging your body in water until cleared by your surgeon. If you have the soap that was given to you by pre-surgical testing that was used before surgery, you do not need to use it afterwards because this can irritate your incisions.   5. No sexual activity and nothing in the vagina for 6 weeks, 12 weeks if you have a hysterectomy (removal of the uterus and cervix).  6. You may experience a small amount of clear drainage from your incisions, which is normal.  If the drainage persists, increases, or changes color please call the office.  7. Do not use creams, lotions, or ointments such as neosporin on your incisions after surgery until advised by your surgeon because they can cause removal of the dermabond glue on your incisions.    8. You may experience vaginal spotting after surgery or when the stitches at the top of the vagina begin to dissolve.  The spotting is normal but if you experience heavy bleeding, call our office.  9. Take Tylenol  or ibuprofen first for pain if you are able to take these medications and only use narcotic pain medication for severe pain not relieved by the Tylenol  or Ibuprofen.  Monitor your Tylenol  intake to a max of 4,000 mg in a 24 hour period. You can alternate these medications after  surgery.  Diet: 1. Low sodium Heart Healthy Diet is recommended but you are cleared to resume your normal (before surgery) diet after your procedure.  2. It is safe to use a laxative, such as Miralax or Colace, if you have difficulty moving your bowels before surgery. You have been prescribed Sennakot-S to take at bedtime every evening after surgery to keep bowel movements  regular and to prevent constipation.    Wound Care: 1. Keep clean and dry.  Shower daily.  Reasons to call the Doctor: Fever - Oral temperature greater than 100.4 degrees Fahrenheit Foul-smelling vaginal discharge Difficulty urinating Nausea and vomiting Increased pain at the site of the incision that is unrelieved with pain medicine. Difficulty breathing with or without chest pain New calf pain especially if only on one side Sudden, continuing increased vaginal bleeding with or without clots.   Contacts: For questions or concerns you should contact:  Dr. Comer Dollar at [phone]  Eleanor Epps, NP at [phone]  After Hours: call [phone] and have the GYN Oncologist paged/contacted (after 5 pm or on the weekends). You will speak with an after hours RN and let he or she know you have had surgery.  Messages sent via mychart are for non-urgent matters and are not responded to after hours so for urgent needs, please call the after hours number.

## 2024-04-18 ENCOUNTER — Encounter: Payer: Self-pay | Admitting: Internal Medicine

## 2024-04-18 ENCOUNTER — Ambulatory Visit: Payer: Self-pay | Attending: Internal Medicine | Admitting: Internal Medicine

## 2024-04-18 VITALS — BP 118/79 | HR 71 | Ht 66.0 in | Wt 198.0 lb

## 2024-04-18 DIAGNOSIS — E039 Hypothyroidism, unspecified: Secondary | ICD-10-CM | POA: Diagnosis not present

## 2024-04-18 DIAGNOSIS — R0609 Other forms of dyspnea: Secondary | ICD-10-CM | POA: Diagnosis not present

## 2024-04-18 DIAGNOSIS — G43109 Migraine with aura, not intractable, without status migrainosus: Secondary | ICD-10-CM

## 2024-04-18 DIAGNOSIS — Z79899 Other long term (current) drug therapy: Secondary | ICD-10-CM

## 2024-04-18 DIAGNOSIS — K769 Liver disease, unspecified: Secondary | ICD-10-CM

## 2024-04-18 DIAGNOSIS — Z01818 Encounter for other preprocedural examination: Secondary | ICD-10-CM

## 2024-04-18 DIAGNOSIS — Z7984 Long term (current) use of oral hypoglycemic drugs: Secondary | ICD-10-CM

## 2024-04-18 DIAGNOSIS — E119 Type 2 diabetes mellitus without complications: Secondary | ICD-10-CM

## 2024-04-18 DIAGNOSIS — E1159 Type 2 diabetes mellitus with other circulatory complications: Secondary | ICD-10-CM | POA: Diagnosis not present

## 2024-04-18 DIAGNOSIS — I152 Hypertension secondary to endocrine disorders: Secondary | ICD-10-CM

## 2024-04-18 NOTE — Progress Notes (Signed)
 Patient ID: Heather Bailey, female    DOB: 10-15-1960  MRN: [id number]  CC: Headache (Headaches f/u. Suellen headaches are still on-going - stressed due to sx in Dec/Already received flu vax)   Subjective: Heather Bailey is a 63 y.o. female who presents for f/u migraine HA. Her concerns today include:  Pt with hx of HTN, T2DM, hypothyroidism, HLD (Atorvastatin  40), depression, chronic LBP (on Gabapentin ), dry skin, CKD 3a   Discussed the use of AI scribe software for clinical note transcription with the patient, who gave verbal consent to proceed.  History of Present Illness Heather Bailey is a 63 year old female who presents for a follow-up regarding her headaches.  Initially diagnosed with possible migraines in September, she was started on Imitrex  and topiramate . An MRI of the head was negative. Despite initial treatment, she continued to experience frequent headaches, leading to an increase in topiramate  dosage from 25 mg to 50 mg daily at bedtime. This adjustment significantly reduced the frequency of her headaches, which were previously occurring every other day, to the point where she is 'not really getting them' anymore.  She is scheduled for GYN surgery with Dr. Viktoria on December 3rd to remove both ovaries due to persistent enlarged ovaries containing multiple cysts. She has a history of cysts in both ovaries and a fibroid in the uterus. Additionally, there is a 1.5 cm spot on the inferior aspect of the liver was noted incidentally on MRI pelvis done 08/2022; ? hemangioma. Dr. Viktoria has ordered pelvic US  as well as liver MRI to be done prior to her surgery.  She has a history of hypertension, currently managed with metoprolol  25 mg daily and losartan  25 mg daily. She also has type 2 diabetes, with a recent A1c of 6.1, and is on metformin . Her blood sugar readings have been mostly within range, with occasional higher readings, the highest being 186. She continues to take levothyroxine  for  thyroid  management, with recent levels within normal range.  Her weight has decreased from 206 lbs in May to 198 lbs currently, which she attributes to lifestyle changes.  Endorses having surgery before under general anesthesia and had no major issues. She endorses shortness of breath over the past month, particularly when walking with her son or around the house, which she attributes to her weight. SOB after walking about 30 mins where she would have to stop to catch her breath. No recent long distance travel. No Le edema.  She uses a cane for support during walks. No chest pain and shortness of breath at rest or when lying down.    Patient Active Problem List   Diagnosis Date Noted   Hyperlipidemia associated with type 2 diabetes mellitus (HCC) 11/03/2019   Class 1 obesity due to excess calories with serious comorbidity and body mass index (BMI) of 32.0 to 32.9 in adult 11/03/2019   Floaters in visual field, right 11/15/2018   Onychomycosis 08/15/2018   Achilles tendinitis of right lower extremity 08/15/2018   Hot flashes 02/23/2017   Chronic low back pain 04/12/2015   Dry skin 04/12/2015   Chronic dryness of both eyes 08/28/2014   DM2 (diabetes mellitus, type 2) (HCC) 03/19/2014   Hypothyroidism 03/19/2014   Hypertension 03/19/2014   Hyperlipidemia 03/19/2014   Depression 03/19/2014     Current Outpatient Medications on File Prior to Visit  Medication Sig Dispense Refill   atorvastatin  (LIPITOR) 80 MG tablet Take 0.5 tablets (40 mg total) by mouth daily.  Blood Pressure Monitor DEVI Use as directed to check home blood pressure 2-3 times a week 1 each 0   glucose blood (TRUE METRIX BLOOD GLUCOSE TEST) test strip Use as instructed to check blood sugar up to 3 times daily. 100 each 2   levothyroxine  (SYNTHROID ) 50 MCG tablet Take 1 tablet (50 mcg total) by mouth daily.Please make an appointment. 90 tablet 1   losartan  (COZAAR ) 25 MG tablet Take 1 tablet (25 mg total) by mouth daily.  90 tablet 1   metFORMIN  (GLUCOPHAGE -XR) 500 MG 24 hr tablet Take 2 tablets (1,000 mg total) by mouth daily. 180 tablet 1   metoprolol  succinate (TOPROL -XL) 25 MG 24 hr tablet Take 1 tablet (25 mg total) by mouth daily. 90 tablet 1   Multiple Vitamin (MULTIVITAMIN ADULT PO) Take by mouth.     sertraline  (ZOLOFT ) 100 MG tablet Take 1 tablet (100 mg total) by mouth daily. 90 tablet 1   SUMAtriptan  (IMITREX ) 50 MG tablet 1 tablet by mouth at start of headache may repeat x1 in 2 hours if headache persists. Max 2 tablets/24hrs (Patient taking differently: 1 tablet by mouth at start of headache may repeat x1 in 2 hours if headache persists. Max 2 tablets/24hrs as needed) 10 tablet 2   topiramate  (TOPAMAX ) 50 MG tablet Take 1 tablet (50 mg total) by mouth daily. Take at bedtime 30 tablet 2   TRUEPLUS LANCETS 28G MISC Use to check blood sugar up to 3 times daily. 100 each 2   diclofenac  sodium (VOLTAREN ) 1 % GEL Apply 2 g topically 4 (four) times daily. (Patient not taking: Reported on 04/18/2024) 100 g 1   No current facility-administered medications on file prior to visit.    Allergies  Allergen Reactions   Zithromax [Azithromycin Dihydrate]     Social History   Socioeconomic History   Marital status: Married    Spouse name: Not on file   Number of children: 2   Years of education: Not on file   Highest education level: Bachelor's degree (e.g., BA, AB, BS)  Occupational History   Occupation: Tool crib assistant  Tobacco Use   Smoking status: Never   Smokeless tobacco: Never  Vaping Use   Vaping status: Never Used  Substance and Sexual Activity   Alcohol use: No   Drug use: No   Sexual activity: Not Currently    Birth control/protection: Surgical  Other Topics Concern   Not on file  Social History Narrative   Lives with husband who has diabetes.    Social Drivers of Corporate Investment Banker Strain: High Risk (04/17/2024)   Overall Financial Resource Strain (CARDIA)     Difficulty of Paying Living Expenses: Very hard  Food Insecurity: No Food Insecurity (04/17/2024)   Hunger Vital Sign    Worried About Running Out of Food in the Last Year: Never true    Ran Out of Food in the Last Year: Never true  Transportation Needs: No Transportation Needs (04/17/2024)   PRAPARE - Administrator, Civil Service (Medical): No    Lack of Transportation (Non-Medical): No  Physical Activity: Insufficiently Active (04/17/2024)   Exercise Vital Sign    Days of Exercise per Week: 4 days    Minutes of Exercise per Session: 30 min  Stress: No Stress Concern Present (04/17/2024)   [employer] of Occupational Health - Occupational Stress Questionnaire    Feeling of Stress: Only a little  Social Connections: Socially Integrated (04/17/2024)   Social  Connection and Isolation Panel    Frequency of Communication with Friends and Family: Three times a week    Frequency of Social Gatherings with Friends and Family: Once a week    Attends Religious Services: More than 4 times per year    Active Member of [employer] or Organizations: Yes    Attends Banker Meetings: More than 4 times per year    Marital Status: Married  Catering Manager Violence: Unknown (08/03/2017)   Humiliation, Afraid, Rape, and Kick questionnaire    Fear of Current or Ex-Partner: Patient declined    Emotionally Abused: Patient declined    Physically Abused: Patient declined    Sexually Abused: Patient declined    Family History  Problem Relation Age of Onset   Hypertension Mother    Stomach cancer Mother    Alzheimer's disease Sister    Colon cancer Neg Hx    Colon polyps Neg Hx    Esophageal cancer Neg Hx    Rectal cancer Neg Hx     Past Surgical History:  Procedure Laterality Date   CESAREAN SECTION     MYOMECTOMY     x2-3   TUBAL LIGATION     at c-section    ROS: Review of Systems Negative except as stated above  PHYSICAL EXAM: BP 118/79 (BP Location: Left Arm,  Patient Position: Sitting, Cuff Size: Normal)   Pulse 71   Ht 5' 6 (1.676 m)   Wt 198 lb (89.8 kg)   SpO2 97%   BMI 31.96 kg/m   Wt Readings from Last 3 Encounters:  04/18/24 198 lb (89.8 kg)  04/14/24 200 lb 3.2 oz (90.8 kg)  03/30/24 199 lb (90.3 kg)    Physical Exam  General appearance - alert, well appearing, older AAF and in no distress Mental status - normal mood, behavior, speech, dress, motor activity, and thought processes Eyes - pink conjunctiva Nose - normal and patent, no erythema, discharge or polyps Mouth - mucous membranes moist, pharynx normal without lesions Neck - supple, no significant adenopathy Chest - clear to auscultation, no wheezes, rales or rhonchi, symmetric air entry Heart - normal rate, regular rhythm, normal S1, S2, no murmurs, rubs, clicks or gallops Extremities - peripheral pulses normal, no pedal edema, no clubbing or cyanosis      Latest Ref Rng & Units 02/21/2024   12:26 PM 04/15/2023    2:02 PM 04/02/2022   12:25 PM  CMP  Glucose 70 - 99 mg/dL 898  97  96   BUN 8 - 27 mg/dL 13  13  16    Creatinine 0.57 - 1.00 mg/dL 8.80  8.82  8.86   Sodium 134 - 144 mmol/L 142  141  142   Potassium 3.5 - 5.2 mmol/L 5.0  4.8  5.1   Chloride 96 - 106 mmol/L 105  104  104   CO2 20 - 29 mmol/L 20  26  23    Calcium  8.7 - 10.3 mg/dL 89.6  89.8  89.7   Total Protein 6.0 - 8.5 g/dL 7.5  7.4  7.6   Total Bilirubin 0.0 - 1.2 mg/dL 0.3  0.3  0.3   Alkaline Phos 44 - 121 IU/L 104  92  96   AST 0 - 40 IU/L 14  20  15    ALT 0 - 32 IU/L 16  17  17     Lipid Panel     Component Value Date/Time   CHOL 159 02/21/2024 1226   TRIG 123  02/21/2024 1226   HDL 41 02/21/2024 1226   CHOLHDL 3.9 02/21/2024 1226   CHOLHDL 4.3 12/19/2015 1620   VLDL 20 12/19/2015 1620   LDLCALC 96 02/21/2024 1226    CBC    Component Value Date/Time   WBC 4.6 04/02/2022 1225   WBC 5.9 09/03/2021 1305   RBC 4.41 04/02/2022 1225   RBC 4.35 09/03/2021 1305   HGB 12.5 04/02/2022 1225    HCT 39.6 04/02/2022 1225   PLT 278 04/02/2022 1225   MCV 90 04/02/2022 1225   MCH 28.3 04/02/2022 1225   MCH 28.7 09/03/2021 1305   MCHC 31.6 04/02/2022 1225   MCHC 31.9 09/03/2021 1305   RDW 13.6 04/02/2022 1225    ASSESSMENT AND PLAN: 1. Migraine with aura and without status migrainosus, not intractable (Primary) Significantly improved since Topamax  has been increased to 50 mg at bedtime.  She wants to hold off on referral to neurology.  2. Dyspnea on exertion Patient reports dyspnea on exertion over the past month which she thinks may be related to her weight.  However she has lost about 8 pounds since May of this year.  Topamax  may be contributing to the weight loss. -I think we have to make sure this is not an angina equivalent given she does have risk factors for heart disease.  Will try to get her in with cardiology prior to the planned GYN surgery next month. - Ambulatory referral to Cardiology  3. Diabetes mellitus treated with oral medication (HCC) Controlled based on blood sugar readings and most recent A1c.  Continue metformin   4. Hypertension associated with diabetes (HCC) Controlled on metoprolol  and Cozaar .  5. Acquired hypothyroidism Controlled.  Continue levothyroxine  50 mcg daily  6. Liver lesion Has MRI of the abdomen scheduled for later this week.  Will check results once available.  7. Preoperative evaluation to rule out surgical contraindication Overall her chronic medical issues including diabetes, hypertension, hypothyroidism are stable.  Will try to get her in with cardiology prior to her surgery to evaluate the dyspnea on exertion. - Ambulatory referral to Cardiology    Patient was given the opportunity to ask questions.  Patient verbalized understanding of the plan and was able to repeat key elements of the plan.   This documentation was completed using Paediatric nurse.  Any transcriptional errors are unintentional.  Orders  Placed This Encounter  Procedures   Ambulatory referral to Cardiology     Requested Prescriptions    No prescriptions requested or ordered in this encounter    Return in about 4 months (around 08/16/2024).  Barnie Louder, MD, FACP

## 2024-04-18 NOTE — Patient Instructions (Signed)
  VISIT SUMMARY: During today's visit, we discussed your ongoing headaches, upcoming surgery, and other health conditions. Your headaches have significantly improved with the increased dosage of topiramate . We also reviewed your preoperative plan for the removal of your ovaries and possibly your uterus. Additionally, we addressed your shortness of breath, diabetes, hypertension, thyroid  function, and weight loss.  YOUR PLAN: -PREOPERATIVE EVALUATION FOR BILATERAL OVARIAN CYSTS AND UTERINE FIBROID: You are scheduled for surgery on December 3rd to remove both ovaries due to persistent cysts and potential risk of cancer. Depending on what is found during surgery, a hysterectomy may also be performed. We will perform a preoperative physical examination and ensure your chronic medical conditions are stable for surgery.  -SHORTNESS OF BREATH ON EXERTION: You have been experiencing shortness of breath when walking, which may be due to deconditioning or could be related to your heart. We will refer you to a cardiologist for further evaluation before your surgery.  -MIGRAINE HEADACHE: Your migraines have improved with the increased dose of topiramate . Since your MRI was normal, we will continue with the current dose of 50 mg daily at bedtime and hold off on referring you to a neurologist for now.  -TYPE 2 DIABETES MELLITUS: Your diabetes is well-controlled with your current A1c level at 6.1%. Continue taking metformin  and monitor your blood glucose levels regularly.  -HYPERTENSION: Your blood pressure is well-controlled with your current medications. Continue taking metoprolol  25 mg daily and losartan  25 mg daily.  -HYPOTHYROIDISM: Your thyroid  function is stable with your current medication. Continue taking levothyroxine  as prescribed.  -OBESITY: Your weight has decreased from 206 lbs to 198 lbs, which may be due to lifestyle changes and the medication Topamax .  -LIVER LESION, POSSIBLE HEMANGIOMA: There is  a 1.5 cm spot on your liver that is suspected to be a hemangioma. You are scheduled to have an MRI of your liver on November 7th to get more information.  INSTRUCTIONS: Please follow up with the cardiologist for your shortness of breath evaluation before your surgery. Continue monitoring your blood glucose levels and take your medications as prescribed. Attend your MRI appointment for your liver on November 7th. Your surgery is scheduled for December 3rd, and we will perform a preoperative physical examination to ensure you are ready for the procedure.                      Contains text generated by Abridge.                                 Contains text generated by Abridge.

## 2024-04-19 ENCOUNTER — Ambulatory Visit (HOSPITAL_COMMUNITY)
Admission: RE | Admit: 2024-04-19 | Discharge: 2024-04-19 | Disposition: A | Discharge status: Home / Self Care | Source: Ambulatory Visit | Attending: Gynecologic Oncology | Admitting: Gynecologic Oncology

## 2024-04-19 DIAGNOSIS — N83209 Unspecified ovarian cyst, unspecified side: Secondary | ICD-10-CM | POA: Insufficient documentation

## 2024-04-21 ENCOUNTER — Ambulatory Visit (HOSPITAL_COMMUNITY): Admission: RE | Admit: 2024-04-21 | Source: Ambulatory Visit

## 2024-04-22 ENCOUNTER — Ambulatory Visit (HOSPITAL_COMMUNITY)
Admission: RE | Admit: 2024-04-22 | Discharge: 2024-04-22 | Disposition: A | Discharge status: Home / Self Care | Source: Ambulatory Visit | Attending: Gynecologic Oncology | Admitting: Gynecologic Oncology

## 2024-04-22 DIAGNOSIS — K769 Liver disease, unspecified: Secondary | ICD-10-CM | POA: Diagnosis present

## 2024-04-22 MED ORDER — GADOBUTROL 1 MMOL/ML IV SOLN
8.0000 mL | Freq: Once | INTRAVENOUS | Status: AC | PRN
  Administered 2024-04-22: 8 mL via INTRAVENOUS

## 2024-04-25 ENCOUNTER — Ambulatory Visit: Payer: Self-pay | Admitting: Gynecologic Oncology

## 2024-04-26 ENCOUNTER — Encounter: Payer: Self-pay | Admitting: Internal Medicine

## 2024-04-27 NOTE — Progress Notes (Addendum)
    Cardiology Office Note Date:  04/28/2024  ID:  Heather Bailey, DOB May 06, 1961, MRN [id number] PCP:  Vicci Barnie NOVAK, MD  Cardiologist:  Joelle VEAR Ren Donley, MD  Chief Complaint  Patient presents with   Pre-op Exam      Problems Pre op for salpingo-oophorectomy  HTN/HLD ASCVD risk 11.9% 09/2021 LDL 96 9/25, HA1C 6.5 10/24 M: AN40, LN25, XL25  Visits  11/25: Exercise Myoview, 2D echo, HA1C/LP in 3 months    History of Present Illness: Heather Bailey is a 63 y.o. female who presents for pre-op.  Over the last few months, she has had worsening dyspnea and reduced exercise tolerance. She she reported dyspnea with walking, doing very activities, and laying down in bed.  She also reports intermittent dry cough that is worse at night.  She denies any chest pain with exertion.  She also denies any PND or LE edema.  She denies any changes in her weight over the last year, but does report that she started snoring recently.  ROS: Please see the history of present illness. All other systems are reviewed and negative.   PHYSICAL EXAM: VS:  BP 120/82 (BP Location: Left Arm, Patient Position: Sitting, Cuff Size: Large)   Pulse 79   Resp 16   Ht 5' 6 (1.676 m)   Wt 200 lb 3.2 oz (90.8 kg)   SpO2 94%   BMI 32.31 kg/m  , BMI Body mass index is 32.31 kg/m. GEN: Well nourished, well developed, in no acute distress HEENT: normal Neck: no JVD, carotid bruits, or masses Cardiac: RRR; no murmurs, rubs, or gallops,no edema  Respiratory:  CTAB bilaterally, normal work of breathing GI: soft, nontender, nondistended, + BS Extremities: No LE edema Skin: warm and dry, no rash Neuro:  Strength and sensation are intact  Recent Labs: Reviewed  Studies: Reviewed  ASSESSMENT AND PLAN: Heather Bailey is a 63 y.o. female who presents for follow up. - In regard to her preop, she is reporting worsening dyspnea with exertion.  She is unable to walk up a flight of stairs without dyspnea.  Will  obtain exercise Myoview for further restratification.  We will also order 2D echocardiogram. - Will obtain A1c and lipid panel in 3 months. - Getting snoring, we will screen for OSA. - Follow-up in 3 months with me.  Addendum 12/3 - 2D echocardiogram with normal biventricular function and stress test negative for ischemia. Can proceed to surgery.   Signed, Joelle VEAR Ren Donley, MD  04/28/2024 9:28 AM    [facility]

## 2024-04-28 ENCOUNTER — Telehealth (HOSPITAL_COMMUNITY): Payer: Self-pay | Admitting: Radiology

## 2024-04-28 ENCOUNTER — Ambulatory Visit

## 2024-04-28 VITALS — BP 120/82 | HR 79 | Resp 16 | Ht 66.0 in | Wt 200.2 lb

## 2024-04-28 DIAGNOSIS — E119 Type 2 diabetes mellitus without complications: Secondary | ICD-10-CM

## 2024-04-28 DIAGNOSIS — E785 Hyperlipidemia, unspecified: Secondary | ICD-10-CM

## 2024-04-28 DIAGNOSIS — Z01818 Encounter for other preprocedural examination: Secondary | ICD-10-CM | POA: Diagnosis not present

## 2024-04-28 DIAGNOSIS — I10 Essential (primary) hypertension: Secondary | ICD-10-CM

## 2024-04-28 DIAGNOSIS — R0609 Other forms of dyspnea: Secondary | ICD-10-CM

## 2024-04-28 NOTE — Telephone Encounter (Signed)
 Detailed instructions left on the voicemail per Myocardial Perfusion Study Information Sheet for the test on 11/17 at 8am. Patient notified to arrive 15 minutes early and that it is imperative to arrive on time for appointment to keep from having the test rescheduled.  If you need to cancel or reschedule your appointment, please call the office within 24 hours of your appointment. . Patient verbalized understanding.EHK

## 2024-04-28 NOTE — Patient Instructions (Addendum)
 Medication Instructions:  Your physician recommends that you continue on your current medications as directed. Please refer to the Current Medication list given to you today.   *If you need a refill on your cardiac medications before your next appointment, please call your pharmacy*  Lab Work: - None ordered  Testing/Procedures: Dr. Ren has ordered a Myocardial Perfusion Imaging Study.   The test will take approximately 3 to 4 hours to complete; you may bring reading material.  If someone comes with you to your appointment, they will need to remain in the main lobby due to limited space in the testing area. **If you are pregnant or breastfeeding, please notify the nuclear lab prior to your appointment**  You will need to hold the following medications for 24 hours prior to your stress test: metoprolol  succinate (Toprol  XL)   How to prepare for your Myocardial Perfusion Test: Do not eat or drink 3 hours prior to your test, except you may have water. Do not consume products containing caffeine (regular or decaffeinated) 12 hours prior to your test. (ex: coffee, chocolate, sodas, tea). Do wear comfortable clothes (no dresses or overalls) and walking shoes, tennis shoes preferred (No heels or open toe shoes are allowed). Do NOT wear cologne, perfume, aftershave, or lotions (deodorant is allowed). If these instructions are not followed, your test will have to be rescheduled.   Your physician has requested that you have an echocardiogram. Echocardiography is a painless test that uses sound waves to create images of your heart. It provides your doctor with information about the size and shape of your heart and how well your heart's chambers and valves are working. This procedure takes approximately one hour. There are no restrictions for this procedure. Please do NOT wear cologne, perfume, aftershave, or lotions (deodorant is allowed). Please arrive 15 minutes prior to your appointment  time.  Please note: We ask at that you not bring children with you during ultrasound (echo/ vascular) testing. Due to room size and safety concerns, children are not allowed in the ultrasound rooms during exams. Our front office staff cannot provide observation of children in our lobby area while testing is being conducted. An adult accompanying a patient to their appointment will only be allowed in the ultrasound room at the discretion of the ultrasound technician under special circumstances. We apologize for any inconvenience.         Follow-Up: At [facility], you and your health needs are our priority.  As part of our continuing mission to provide you with exceptional heart care, our providers are all part of one team.  This team includes your primary Cardiologist (physician) and Advanced Practice Providers or APPs (Physician Assistants and Nurse Practitioners) who all work together to provide you with the care you need, when you need it.  Your next appointment:   3 month(s)  Provider:   Joelle VEAR Ren Donley, MD    We recommend signing up for the patient portal called MyChart.  Sign up information is provided on this After Visit Summary.  MyChart is used to connect with patients for Virtual Visits (Telemedicine).  Patients are able to view lab/test results, encounter notes, upcoming appointments, etc.  Non-urgent messages can be sent to your provider as well.   To learn more about what you can do with MyChart, go to [email or web address].

## 2024-05-01 ENCOUNTER — Ambulatory Visit (HOSPITAL_COMMUNITY)
Admission: RE | Admit: 2024-05-01 | Discharge: 2024-05-01 | Disposition: A | Discharge status: Home / Self Care | Source: Ambulatory Visit | Attending: Cardiology | Admitting: Cardiology

## 2024-05-01 ENCOUNTER — Ambulatory Visit (HOSPITAL_COMMUNITY)

## 2024-05-01 ENCOUNTER — Other Ambulatory Visit: Payer: Self-pay

## 2024-05-01 ENCOUNTER — Other Ambulatory Visit: Payer: Self-pay | Admitting: Internal Medicine

## 2024-05-01 DIAGNOSIS — I10 Essential (primary) hypertension: Secondary | ICD-10-CM | POA: Diagnosis not present

## 2024-05-01 DIAGNOSIS — E1169 Type 2 diabetes mellitus with other specified complication: Secondary | ICD-10-CM

## 2024-05-01 DIAGNOSIS — E119 Type 2 diabetes mellitus without complications: Secondary | ICD-10-CM

## 2024-05-01 DIAGNOSIS — R0609 Other forms of dyspnea: Secondary | ICD-10-CM | POA: Diagnosis present

## 2024-05-01 LAB — ECHOCARDIOGRAM COMPLETE
Area-P 1/2: 3.56 cm2
Est EF: 55
P 1/2 time: 565 ms
S' Lateral: 3.3 cm

## 2024-05-01 MED ORDER — ATORVASTATIN CALCIUM 40 MG PO TABS
40.0000 mg | ORAL_TABLET | Freq: Every day | ORAL | 0 refills | Status: AC
  Filled 2024-05-01: qty 90, 90d supply, fill #0

## 2024-05-02 NOTE — Progress Notes (Signed)
 COVID Vaccine received:  []  No [x]  Yes Date of any COVID positive Test in last 90 days: no PCP - Barnie Louder MD Cardiologist - Dr. Joelle Ny  Chest x-ray -  EKG -  04/28/24 EPIC Stress Test - 04/28/24 Epic ECHO - 05/01/24 Epic Cardiac Cath -   Bowel Prep - [x]  No  []   Yes ______  Pacemaker / ICD device [x]  No []  Yes   Spinal Cord Stimulator:[x]  No []  Yes       History of Sleep Apnea? [x]  No []  Yes   CPAP used?- [x]  No []  Yes    Does the patient monitor blood sugar?          []  No [x]  Yes  []  N/A  Patient has: []  NO Hx DM   []  Pre-DM                 []  DM1  [x]   DM2 Does patient have a [employer] or Dexacom? [x]  No []  Yes   Fasting Blood Sugar Ranges- 112 Checks Blood Sugar ___1__ times a every other week  GLP1 agonist / usual dose - no GLP1 instructions: no SGLT-2 instructions: no  Blood Thinner / Instructions:no Aspirin  Instructions:no  Comments:   Activity level: Patient is able  to climb a flight of stairs without difficulty; [x]  No CP  []  No SOB,    Patient can perform ADLs without assistance.   Anesthesia review: DM, HTN, DVT, Seen by Dr. Joelle Ny for cardiac workup 04/28/24. He ordered a stress test which had not been done when in for PST appointment.Echo was completed.  Patient denies shortness of breath, fever, cough and chest pain at PAT appointment.  Patient verbalized understanding and agreement to the Pre-Surgical Instructions that were given to them at this PAT appointment. Patient was also educated of the need to review these PAT instructions again prior to his/her surgery.I reviewed the appropriate phone numbers to call if they have any and questions or concerns.

## 2024-05-02 NOTE — Patient Instructions (Signed)
 SURGICAL WAITING ROOM VISITATION  Patients having surgery or a procedure may have no more than 2 support people in the waiting area - these visitors may rotate.    Children under the age of 65 must have an adult with them who is not the patient.  Visitors with respiratory illnesses are discouraged from visiting and should remain at home.  If the patient needs to stay at the hospital during part of their recovery, the visitor guidelines for inpatient rooms apply. Pre-op nurse will coordinate an appropriate time for 1 support person to accompany patient in pre-op.  This support person may not rotate.    Please refer to the [facility] website for the visitor guidelines for Inpatients (after your surgery is over and you are in a regular room).       Your procedure is scheduled on: 05/17/24   Report to [facility] Main Entrance    Report to admitting at 6:15 AM   Call this number if you have problems the morning of surgery [phone]               Eat a light diet the day prior to surgery. Avoid gas producing food and drinks like cabbage, bean, broccoli, soda.   Do not eat food  :After Midnight.   After Midnight you may have the following liquids until 5:30 AM DAY OF SURGERY  Water Non-Citrus Juices (without pulp, NO RED-Apple, White grape, White cranberry) Black Coffee (NO MILK/CREAM OR CREAMERS, sugar ok)  Clear Tea (NO MILK/CREAM OR CREAMERS, sugar ok) regular and decaf                             Plain Jell-O (NO RED)                                           Fruit ices (not with fruit pulp, NO RED)                                     Popsicles (NO RED)                                                               Sports drinks like Gatorade (NO RED)                Oral Hygiene is also important to reduce your risk of infection.                                    Remember - BRUSH YOUR TEETH THE MORNING OF SURGERY WITH YOUR REGULAR TOOTHPASTE    Stop all vitamins  and herbal supplements 7 days before surgery.   Take these medicines the morning of surgery with A SIP OF WATER: atorvastatin , levothyroxine , metoprolol , sertraline (zoloft ), eye drops.               Do not take losartan  the morning of surgery.  You may not have any metal on your body including hair pins, jewelry, and body piercing             Do not wear make-up, lotions, powders, perfumes/cologne, or deodorant  Do not wear nail polish including gel and S&S, artificial/acrylic nails, or any other type of covering on natural nails including finger and toenails. If you have artificial nails, gel coating, etc. that needs to be removed by a nail salon please have this removed prior to surgery or surgery may need to be canceled/ delayed if the surgeon/ anesthesia feels like they are unable to be safely monitored.   Do not shave  48 hours prior to surgery.          Do not bring valuables to the hospital. [place] IS NOT             RESPONSIBLE   FOR VALUABLES.   Contacts, glasses, dentures or bridgework may not be worn into surgery.    DO NOT BRING YOUR HOME MEDICATIONS TO THE HOSPITAL. PHARMACY WILL DISPENSE MEDICATIONS LISTED ON YOUR MEDICATION LIST TO YOU DURING YOUR ADMISSION IN THE HOSPITAL!    Patients discharged on the day of surgery will not be allowed to drive home.  Someone NEEDS to stay with you for the first 24 hours after anesthesia.   Special Instructions: Bring a copy of your healthcare power of attorney and living will documents the day of surgery if you haven't scanned them before.              Please read over the following fact sheets you were given: IF YOU HAVE QUESTIONS ABOUT YOUR PRE-OP INSTRUCTIONS PLEASE CALL [phone] Heather Bailey   If you received a COVID test during your pre-op visit  it is requested that you wear a mask when out in public, stay away from anyone that may not be feeling well and notify your surgeon if you develop symptoms. If you test  positive for Covid or have been in contact with anyone that has tested positive in the last 10 days please notify you surgeon.    [place] - Preparing for Surgery Before surgery, you can play an important role.  Because skin is not sterile, your skin needs to be as free of germs as possible.  You can reduce the number of germs on your skin by washing with CHG (chlorahexidine gluconate) soap before surgery.  CHG is an antiseptic cleaner which kills germs and bonds with the skin to continue killing germs even after washing. Please DO NOT use if you have an allergy to CHG or antibacterial soaps.  If your skin becomes reddened/irritated stop using the CHG and inform your nurse when you arrive at Short Stay. Do not shave (including legs and underarms) for at least 48 hours prior to the first CHG shower.  You may shave your face/neck.  Please follow these instructions carefully:  1.  Shower with CHG Soap the night before surgery ONLY (DO NOT USE THE SOAP THE MORNING OF SURGERY).  2.  If you choose to wash your hair, wash your hair first as usual with your normal  shampoo.  3.  After you shampoo, rinse your hair and body thoroughly to remove the shampoo.                             4.  Use CHG as you would any other liquid soap.  You can apply  chg directly to the skin and wash.  Gently with a scrungie or clean washcloth.  5.  Apply the CHG Soap to your body ONLY FROM THE NECK DOWN.   Do   not use on face/ open                           Wound or open sores. Avoid contact with eyes, ears mouth and   genitals (private parts).                       Wash face,  Genitals (private parts) with your normal soap.             6.  Wash thoroughly, paying special attention to the area where your    surgery  will be performed.  7.  Thoroughly rinse your body with warm water from the neck down.  8.  DO NOT shower/wash with your normal soap after using and rinsing off the CHG Soap.                9.  Pat yourself dry  with a clean towel.            10.  Wear clean pajamas.            11.  Place clean sheets on your bed the night of your first shower and do not  sleep with pets. Day of Surgery : Do not apply any CHG, lotions/deodorants the morning of surgery.  Please wear clean clothes to the hospital/surgery center.  FAILURE TO FOLLOW THESE INSTRUCTIONS MAY RESULT IN THE CANCELLATION OF YOUR SURGERY  PATIENT SIGNATURE_________________________________  NURSE SIGNATURE__________________________________  ________________________________________________________________________ WHAT IS A BLOOD TRANSFUSION? Blood Transfusion Information  A transfusion is the replacement of blood or some of its parts. Blood is made up of multiple cells which provide different functions. Red blood cells carry oxygen and are used for blood loss replacement. White blood cells fight against infection. Platelets control bleeding. Plasma helps clot blood. Other blood products are available for specialized needs, such as hemophilia or other clotting disorders. BEFORE THE TRANSFUSION  Who gives blood for transfusions?  Healthy volunteers who are fully evaluated to make sure their blood is safe. This is blood bank blood. Transfusion therapy is the safest it has ever been in the practice of medicine. Before blood is taken from a donor, a complete history is taken to make sure that person has no history of diseases nor engages in risky social behavior (examples are intravenous drug use or sexual activity with multiple partners). The donor's travel history is screened to minimize risk of transmitting infections, such as malaria. The donated blood is tested for signs of infectious diseases, such as HIV and hepatitis. The blood is then tested to be sure it is compatible with you in order to minimize the chance of a transfusion reaction. If you or a relative donates blood, this is often done in anticipation of surgery and is not appropriate for  emergency situations. It takes many days to process the donated blood. RISKS AND COMPLICATIONS Although transfusion therapy is very safe and saves many lives, the main dangers of transfusion include:  Getting an infectious disease. Developing a transfusion reaction. This is an allergic reaction to something in the blood you were given. Every precaution is taken to prevent this. The decision to have a blood transfusion has been considered carefully by your caregiver before blood is given.  Blood is not given unless the benefits outweigh the risks. AFTER THE TRANSFUSION Right after receiving a blood transfusion, you will usually feel much better and more energetic. This is especially true if your red blood cells have gotten low (anemic). The transfusion raises the level of the red blood cells which carry oxygen, and this usually causes an energy increase. The nurse administering the transfusion will monitor you carefully for complications. HOME CARE INSTRUCTIONS  No special instructions are needed after a transfusion. You may find your energy is better. Speak with your caregiver about any limitations on activity for underlying diseases you may have. SEEK MEDICAL CARE IF:  Your condition is not improving after your transfusion. You develop redness or irritation at the intravenous (IV) site. SEEK IMMEDIATE MEDICAL CARE IF:  Any of the following symptoms occur over the next 12 hours: Shaking chills. You have a temperature by mouth above 102 F (38.9 C), not controlled by medicine. Chest, back, or muscle pain. People around you feel you are not acting correctly or are confused. Shortness of breath or difficulty breathing. Dizziness and fainting. You get a rash or develop hives. You have a decrease in urine output. Your urine turns a dark color or changes to pink, red, or brown. Any of the following symptoms occur over the next 10 days: You have a temperature by mouth above 102 F (38.9 C), not  controlled by medicine. Shortness of breath. Weakness after normal activity. The white part of the eye turns yellow (jaundice). You have a decrease in the amount of urine or are urinating less often. Your urine turns a dark color or changes to pink, red, or brown. Document Released: 05/29/2000 Document Revised: 08/24/2011 Document Reviewed: 01/16/2008 [facility] Patient Information 2014 [place], [place].

## 2024-05-03 ENCOUNTER — Inpatient Hospital Stay: Admitting: Gynecologic Oncology

## 2024-05-03 ENCOUNTER — Other Ambulatory Visit: Payer: Self-pay

## 2024-05-03 ENCOUNTER — Ambulatory Visit: Payer: Self-pay

## 2024-05-03 ENCOUNTER — Telehealth: Payer: Self-pay

## 2024-05-03 NOTE — Telephone Encounter (Signed)
 Hi [name],    The ultrasound of your heart showed normal heart function with no issues with any of the valves. We will wait to get results of your stress test to decide next steps.   Joelle VEAR Ren Donley, MD 05/03/2024 12:42 PM  Written by Joelle VEAR Ren Donley, MD on 05/03/2024 12:42 PM EST Seen by patient Heather Bailey on 05/03/2024  2:43 PM

## 2024-05-03 NOTE — Telephone Encounter (Signed)
 Pt called in and would like someone to give her a call regarding her Echo Results

## 2024-05-04 ENCOUNTER — Encounter (HOSPITAL_COMMUNITY): Payer: Self-pay

## 2024-05-04 ENCOUNTER — Encounter: Payer: Self-pay | Admitting: Gynecologic Oncology

## 2024-05-04 ENCOUNTER — Encounter (HOSPITAL_COMMUNITY)
Admission: RE | Admit: 2024-05-04 | Discharge: 2024-05-04 | Disposition: A | Discharge status: Home / Self Care | Source: Ambulatory Visit | Attending: Gynecologic Oncology | Admitting: Gynecologic Oncology

## 2024-05-04 ENCOUNTER — Other Ambulatory Visit: Payer: Self-pay

## 2024-05-04 ENCOUNTER — Inpatient Hospital Stay: Attending: Gynecologic Oncology | Admitting: Gynecologic Oncology

## 2024-05-04 VITALS — BP 112/79 | HR 73 | Temp 98.4°F | Resp 16 | Ht 66.0 in | Wt 198.0 lb

## 2024-05-04 DIAGNOSIS — N83209 Unspecified ovarian cyst, unspecified side: Secondary | ICD-10-CM

## 2024-05-04 DIAGNOSIS — Z01812 Encounter for preprocedural laboratory examination: Secondary | ICD-10-CM | POA: Insufficient documentation

## 2024-05-04 DIAGNOSIS — N9489 Other specified conditions associated with female genital organs and menstrual cycle: Secondary | ICD-10-CM | POA: Insufficient documentation

## 2024-05-04 DIAGNOSIS — Z7189 Other specified counseling: Secondary | ICD-10-CM

## 2024-05-04 DIAGNOSIS — E119 Type 2 diabetes mellitus without complications: Secondary | ICD-10-CM | POA: Diagnosis not present

## 2024-05-04 DIAGNOSIS — Z8742 Personal history of other diseases of the female genital tract: Secondary | ICD-10-CM | POA: Diagnosis not present

## 2024-05-04 DIAGNOSIS — D259 Leiomyoma of uterus, unspecified: Secondary | ICD-10-CM

## 2024-05-04 HISTORY — DX: Depression, unspecified: F32.A

## 2024-05-04 HISTORY — DX: Headache, unspecified: R51.9

## 2024-05-04 HISTORY — DX: Chronic kidney disease, unspecified: N18.9

## 2024-05-04 HISTORY — DX: Dyspnea, unspecified: R06.00

## 2024-05-04 HISTORY — DX: Hypothyroidism, unspecified: E03.9

## 2024-05-04 LAB — CBC
HCT: 38.3 % (ref 36.0–46.0)
Hemoglobin: 12.1 g/dL (ref 12.0–15.0)
MCH: 29.1 pg (ref 26.0–34.0)
MCHC: 31.6 g/dL (ref 30.0–36.0)
MCV: 92.1 fL (ref 80.0–100.0)
Platelets: 227 K/uL (ref 150–400)
RBC: 4.16 MIL/uL (ref 3.87–5.11)
RDW: 13.3 % (ref 11.5–15.5)
WBC: 3.9 K/uL — ABNORMAL LOW (ref 4.0–10.5)
nRBC: 0 % (ref 0.0–0.2)

## 2024-05-04 LAB — COMPREHENSIVE METABOLIC PANEL WITH GFR
ALT: 17 U/L (ref 0–44)
AST: 27 U/L (ref 15–41)
Albumin: 4.4 g/dL (ref 3.5–5.0)
Alkaline Phosphatase: 93 U/L (ref 38–126)
Anion gap: 9 (ref 5–15)
BUN: 20 mg/dL (ref 8–23)
CO2: 22 mmol/L (ref 22–32)
Calcium: 10 mg/dL (ref 8.9–10.3)
Chloride: 107 mmol/L (ref 98–111)
Creatinine, Ser: 1.37 mg/dL — ABNORMAL HIGH (ref 0.44–1.00)
GFR, Estimated: 43 mL/min — ABNORMAL LOW (ref >60)
Glucose, Bld: 112 mg/dL — ABNORMAL HIGH (ref 70–99)
Potassium: 4.8 mmol/L (ref 3.5–5.1)
Sodium: 139 mmol/L (ref 135–145)
Total Bilirubin: 0.3 mg/dL (ref 0.0–1.2)
Total Protein: 8 g/dL (ref 6.5–8.1)

## 2024-05-04 LAB — HEMOGLOBIN A1C
Hgb A1c MFr Bld: 6 % — ABNORMAL HIGH (ref 4.8–5.6)
Mean Plasma Glucose: 125.5 mg/dL

## 2024-05-04 LAB — TYPE AND SCREEN
ABO/RH(D): B POS
Antibody Screen: NEGATIVE

## 2024-05-04 LAB — GLUCOSE, CAPILLARY: Glucose-Capillary: 100 mg/dL — ABNORMAL HIGH (ref 70–99)

## 2024-05-04 NOTE — Progress Notes (Signed)
 Gynecologic Oncology Telehealth Note: Gyn-Onc  I connected with Heather Bailey on 05/04/24 at  5:00 PM EST by telephone and verified that I am speaking with the correct person using two identifiers.  I discussed the limitations, risks, security and privacy concerns of performing an evaluation and management service by telemedicine and the availability of in-person appointments. I also discussed with the patient that there may be a patient responsible charge related to this service. The patient expressed understanding and agreed to proceed.  Other persons participating in the visit and their role in the encounter: none.  Patient's location: home, [place] Provider's location: [facility]  Reason for Visit: follow-up, treatment planning  Treatment History: The patient reports being followed for at least the last 3-4 years for cyst on her ovaries.  At the time that this was initially found, she did not have insurance.  Now she has insurance and is following with her OB/GYN regularly.   Pelvic ultrasound exam performed on 04/30/2021 at [facility] showed a uterus measuring 6.3 x 6.3 x 4.1 cm with an endometrial lining of 7.3 mm.  Left ovary measures up to 5.7 cm in size and right up to 5.2 cm in size.  Multiple cysts seen in bilateral ovaries, largest on the right measures up to 3.2 cm and on the left measures up to 3.5 cm.   Tumor markers were performed on 11/20/2021.  CEA was 1.9, CA 19-9 was <2 and CA-125 was mildly elevated at 54.9. Pelvic ultrasound exam at [facility] on 11/20/2021 showed a uterus measuring 5.8 x 6.8 x 4.3 cm with an endometrial lining of 3.1 mm.  Left ovary measures 5.3 x 4.3 x 4 cm.  Right ovary measures 4.4 x 3.3 x 2.6 cm.  There are multiple simple cyst seen bilaterally, the 3 largest on the right measuring up to 2.8 cm, along with a small projection within the cyst.  Left also with multiple simple cysts, the largest measuring up to 2.8 cm.  Multiple fibroids noted within the  uterus, largest measuring up to 2.9 cm.  Small amount of free fluid noted within the cul-de-sac.   Pelvic ultrasound 12/2021: 1. Multiple uterine fibroids. 2. Bilateral simple ovarian cysts. Multiple ovarian cysts, largest 4.3 centimeters. Recommend follow-up pelvic US  in 3-6 months. Note: This recommendation does not apply to premenarchal patients or to those with increased risk (genetic, family history, elevated tumor markers or other high-risk factors) of ovarian cancer. Reference: Radiology 2019 Nov; 293(2):359-371.   Pelvic ultrasound 04/2022: 1. Uterine fibroids, described above. 2. Findings suggest bilateral hydrosalpinx. No definite interval change, but comparison between the studies is difficult due to interoperator differences. 3. Endometrium not visualized. 4. The sonographic findings are indeterminate, and MRI of the pelvis might be helpful for further evaluation.   Pelvic MRI 08/2022: 1. Enlarged bilateral ovaries containing multiple cysts, measuring up to 3.1 cm on the left and 5.5 cm on the right. Apparent mural thickening along the posterolateral right ovary does not demonstrate associated restricted diffusion or definite enhancement and is favored to reflect a benign process. A 4 mm focus of nodular enhancement is seen along the posterior left ovary, which is indeterminate but may be artifactual related to volume averaging. Continued follow-up pelvic ultrasound in 6-12 months can be considered to assess for interval change, as clinically indicated. 2. Multiple intrauterine masses measuring up to 2.7 cm, likely leiomyomas. The endometrium is distorted, measuring 4 mm.   Pelvic ultrasound 03/2023: 1. No significant interval change in  size or appearance of septated cystic lesions within the bilateral ovaries. As noted on previous MR there are multiple irregular internal areas of septation, some of which appear thickened and containing increased blood flow. Findings are concerning for  benign or malignant ovarian neoplasms. 2. Small uterine fibroids. 3. Sub optimal visualization of the endometrial stripe which by me best estimate measures 8 mm. On the previous MRI there are no signs of endometrial thickening.  Pelvic ultrasound: 1. Septated right ovarian cystic lesion measuring 5.0 x 3.2 x 3.5 cm, decreased from prior; continued follow-up with ultrasound or MRI recommended if surgical resection is not planned . 2. Multiple left ovarian cysts with the largest containing internal echoes measuring 2.2 x 2.5 x 2.0 cm, decreased from prior; continued follow-up with ultrasound or MRI recommended if surgical resection is not planned. 3. Endometrial thickness of 8 mm in a postmenopausal patient, recommend gynecology evaluation and endometrial sampling. 4. Uterine fibroids as described.  Interval History: Doing well.  Past Medical/Surgical History: Past Medical History:  Diagnosis Date   Anemia Dx 2003   Arthritis Dx 1999   Chronic kidney disease    Depression    DVT (deep venous thrombosis) (HCC) 2007   got Lupron to shrink fibroids, developed DVT after (blood thinner for 6 months)   Dyspnea    Fibroids    Gestational diabetes    Headache    Hyperlipidemia Dx 2000   Hypertension Dx 2000   Hypertensive retinopathy    Hypothyroidism    Thyroid  disease Dx 2005    Past Surgical History:  Procedure Laterality Date   CESAREAN SECTION     MYOMECTOMY     x2-3   MYOMECTOMY     TUBAL LIGATION     at c-section    Family History  Problem Relation Age of Onset   Hypertension Mother    Stomach cancer Mother    Alzheimer's disease Sister    Colon cancer Neg Hx    Colon polyps Neg Hx    Esophageal cancer Neg Hx    Rectal cancer Neg Hx     Social History   Socioeconomic History   Marital status: Married    Spouse name: Not on file   Number of children: 2   Years of education: Not on file   Highest education level: Bachelor's degree (e.g., BA, AB, BS)   Occupational History   Occupation: Tool crib assistant  Tobacco Use   Smoking status: Never   Smokeless tobacco: Never  Vaping Use   Vaping status: Never Used  Substance and Sexual Activity   Alcohol use: No   Drug use: No   Sexual activity: Not Currently    Birth control/protection: Surgical  Other Topics Concern   Not on file  Social History Narrative   Lives with husband who has diabetes.    Social Drivers of Corporate Investment Banker Strain: High Risk (04/17/2024)   Overall Financial Resource Strain (CARDIA)    Difficulty of Paying Living Expenses: Very hard  Food Insecurity: No Food Insecurity (04/17/2024)   Hunger Vital Sign    Worried About Running Out of Food in the Last Year: Never true    Ran Out of Food in the Last Year: Never true  Transportation Needs: No Transportation Needs (04/17/2024)   PRAPARE - Administrator, Civil Service (Medical): No    Lack of Transportation (Non-Medical): No  Physical Activity: Insufficiently Active (04/17/2024)   Exercise Vital Sign    Days  of Exercise per Week: 4 days    Minutes of Exercise per Session: 30 min  Stress: No Stress Concern Present (04/17/2024)   [employer] of Occupational Health - Occupational Stress Questionnaire    Feeling of Stress: Only a little  Social Connections: Socially Integrated (04/17/2024)   Social Connection and Isolation Panel    Frequency of Communication with Friends and Family: Three times a week    Frequency of Social Gatherings with Friends and Family: Once a week    Attends Religious Services: More than 4 times per year    Active Member of [employer] or Organizations: Yes    Attends Engineer, Structural: More than 4 times per year    Marital Status: Married    Current Medications:  Current Outpatient Medications:    atorvastatin  (LIPITOR) 40 MG tablet, Take 1 tablet (40 mg total) by mouth daily., Disp: 90 tablet, Rfl: 0   Blood Pressure Monitor DEVI, Use as directed to  check home blood pressure 2-3 times a week, Disp: 1 each, Rfl: 0   glucose blood (TRUE METRIX BLOOD GLUCOSE TEST) test strip, Use as instructed to check blood sugar up to 3 times daily., Disp: 100 each, Rfl: 2   levothyroxine  (SYNTHROID ) 50 MCG tablet, Take 1 tablet (50 mcg total) by mouth daily.Please make an appointment., Disp: 90 tablet, Rfl: 1   losartan  (COZAAR ) 25 MG tablet, Take 1 tablet (25 mg total) by mouth daily., Disp: 90 tablet, Rfl: 1   metFORMIN  (GLUCOPHAGE -XR) 500 MG 24 hr tablet, Take 2 tablets (1,000 mg total) by mouth daily., Disp: 180 tablet, Rfl: 1   metoprolol  succinate (TOPROL -XL) 25 MG 24 hr tablet, Take 1 tablet (25 mg total) by mouth daily., Disp: 90 tablet, Rfl: 1   Multiple Vitamin (MULTIVITAMIN ADULT PO), Take by mouth. (Patient not taking: Reported on 04/28/2024), Disp: , Rfl:    Polyvinyl Alcohol-Povidone (REFRESH OP), Place 1 drop into both eyes daily as needed (dry eyes)., Disp: , Rfl:    sertraline  (ZOLOFT ) 100 MG tablet, Take 1 tablet (100 mg total) by mouth daily., Disp: 90 tablet, Rfl: 1   SUMAtriptan  (IMITREX ) 50 MG tablet, 1 tablet by mouth at start of headache may repeat x1 in 2 hours if headache persists. Max 2 tablets/24hrs (Patient taking differently: 1 tablet by mouth at start of headache may repeat x1 in 2 hours if headache persists. Max 2 tablets/24hrs as needed), Disp: 10 tablet, Rfl: 2   topiramate  (TOPAMAX ) 50 MG tablet, Take 1 tablet (50 mg total) by mouth daily. Take at bedtime, Disp: 30 tablet, Rfl: 2   TRUEPLUS LANCETS 28G MISC, Use to check blood sugar up to 3 times daily., Disp: 100 each, Rfl: 2  Review of Symptoms: Pertinent positives as per HPI.  Physical Exam: Deferred given limitations of phone visit.  Laboratory & Radiologic Studies: Liver MRI 04/22/24:   IMPRESSION: 1. 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 2. No additional  enhancing hepatic lesions identified.  Assessment & Plan: Heather Bailey is a 63 y.o. woman with fibroid uterus, complex adnexal masses bilaterally.   Doing well.  Discussed plan for surgery again.  Patient continues to desire keeping her uterus and cervix if not necessary to remove at the time of surgery.  Given mildly thickened endometrium on recent ultrasound, I recommended at least an endometrial biopsy on the day of surgery.  Discussed specifically risk of adhesions given her  surgical history and that this may increase the risk of damage to surrounding organs.  We discussed plan for surgery which will include endometrial sampling, robotic bilateral salpingo-oophorectomy, possible total hysterectomy, possible staging, possible laparotomy.  The risks of surgery were discussed in detail and she understands these to include infection; wound separation; hernia; vaginal cuff separation, injury to adjacent organs such as bowel, bladder, blood vessels, ureters and nerves; bleeding which may require blood transfusion; anesthesia risk; thromboembolic events; possible death; unforeseen complications; possible need for re-exploration; medical complications such as heart attack, stroke, pleural effusion and pneumonia. The patient will receive DVT and antibiotic prophylaxis as indicated. She voiced a clear understanding. She had the opportunity to ask questions. Perioperative instructions were reviewed with her. Prescriptions for post-op medications were sent to her pharmacy of choice.  I discussed the assessment and treatment plan with the patient. The patient was provided with an opportunity to ask questions and all were answered. The patient agreed with the plan and demonstrated an understanding of the instructions.   The patient was advised to call back or see an in-person evaluation if the symptoms worsen or if the condition fails to improve as anticipated.   22 minutes of total time was spent for this  patient encounter, including preparation, phone counseling with the patient and coordination of care, and documentation of the encounter.   Comer Dollar, MD  Division of Gynecologic Oncology  Department of Obstetrics and Gynecology  [facility]

## 2024-05-04 NOTE — H&P (View-Only) (Signed)
 Gynecologic Oncology Telehealth Note: Gyn-Onc  I connected with Heather Bailey on 05/04/24 at  5:00 PM EST by telephone and verified that I am speaking with the correct person using two identifiers.  I discussed the limitations, risks, security and privacy concerns of performing an evaluation and management service by telemedicine and the availability of in-person appointments. I also discussed with the patient that there may be a patient responsible charge related to this service. The patient expressed understanding and agreed to proceed.  Other persons participating in the visit and their role in the encounter: none.  Patient's location: home, [place] Provider's location: [facility]  Reason for Visit: follow-up, treatment planning  Treatment History: The patient reports being followed for at least the last 3-4 years for cyst on her ovaries.  At the time that this was initially found, she did not have insurance.  Now she has insurance and is following with her OB/GYN regularly.   Pelvic ultrasound exam performed on 04/30/2021 at [facility] showed a uterus measuring 6.3 x 6.3 x 4.1 cm with an endometrial lining of 7.3 mm.  Left ovary measures up to 5.7 cm in size and right up to 5.2 cm in size.  Multiple cysts seen in bilateral ovaries, largest on the right measures up to 3.2 cm and on the left measures up to 3.5 cm.   Tumor markers were performed on 11/20/2021.  CEA was 1.9, CA 19-9 was <2 and CA-125 was mildly elevated at 54.9. Pelvic ultrasound exam at [facility] on 11/20/2021 showed a uterus measuring 5.8 x 6.8 x 4.3 cm with an endometrial lining of 3.1 mm.  Left ovary measures 5.3 x 4.3 x 4 cm.  Right ovary measures 4.4 x 3.3 x 2.6 cm.  There are multiple simple cyst seen bilaterally, the 3 largest on the right measuring up to 2.8 cm, along with a small projection within the cyst.  Left also with multiple simple cysts, the largest measuring up to 2.8 cm.  Multiple fibroids noted within the  uterus, largest measuring up to 2.9 cm.  Small amount of free fluid noted within the cul-de-sac.   Pelvic ultrasound 12/2021: 1. Multiple uterine fibroids. 2. Bilateral simple ovarian cysts. Multiple ovarian cysts, largest 4.3 centimeters. Recommend follow-up pelvic US  in 3-6 months. Note: This recommendation does not apply to premenarchal patients or to those with increased risk (genetic, family history, elevated tumor markers or other high-risk factors) of ovarian cancer. Reference: Radiology 2019 Nov; 293(2):359-371.   Pelvic ultrasound 04/2022: 1. Uterine fibroids, described above. 2. Findings suggest bilateral hydrosalpinx. No definite interval change, but comparison between the studies is difficult due to interoperator differences. 3. Endometrium not visualized. 4. The sonographic findings are indeterminate, and MRI of the pelvis might be helpful for further evaluation.   Pelvic MRI 08/2022: 1. Enlarged bilateral ovaries containing multiple cysts, measuring up to 3.1 cm on the left and 5.5 cm on the right. Apparent mural thickening along the posterolateral right ovary does not demonstrate associated restricted diffusion or definite enhancement and is favored to reflect a benign process. A 4 mm focus of nodular enhancement is seen along the posterior left ovary, which is indeterminate but may be artifactual related to volume averaging. Continued follow-up pelvic ultrasound in 6-12 months can be considered to assess for interval change, as clinically indicated. 2. Multiple intrauterine masses measuring up to 2.7 cm, likely leiomyomas. The endometrium is distorted, measuring 4 mm.   Pelvic ultrasound 03/2023: 1. No significant interval change in  size or appearance of septated cystic lesions within the bilateral ovaries. As noted on previous MR there are multiple irregular internal areas of septation, some of which appear thickened and containing increased blood flow. Findings are concerning for  benign or malignant ovarian neoplasms. 2. Small uterine fibroids. 3. Sub optimal visualization of the endometrial stripe which by me best estimate measures 8 mm. On the previous MRI there are no signs of endometrial thickening.  Pelvic ultrasound: 1. Septated right ovarian cystic lesion measuring 5.0 x 3.2 x 3.5 cm, decreased from prior; continued follow-up with ultrasound or MRI recommended if surgical resection is not planned . 2. Multiple left ovarian cysts with the largest containing internal echoes measuring 2.2 x 2.5 x 2.0 cm, decreased from prior; continued follow-up with ultrasound or MRI recommended if surgical resection is not planned. 3. Endometrial thickness of 8 mm in a postmenopausal patient, recommend gynecology evaluation and endometrial sampling. 4. Uterine fibroids as described.  Interval History: Doing well.  Past Medical/Surgical History: Past Medical History:  Diagnosis Date   Anemia Dx 2003   Arthritis Dx 1999   Chronic kidney disease    Depression    DVT (deep venous thrombosis) (HCC) 2007   got Lupron to shrink fibroids, developed DVT after (blood thinner for 6 months)   Dyspnea    Fibroids    Gestational diabetes    Headache    Hyperlipidemia Dx 2000   Hypertension Dx 2000   Hypertensive retinopathy    Hypothyroidism    Thyroid  disease Dx 2005    Past Surgical History:  Procedure Laterality Date   CESAREAN SECTION     MYOMECTOMY     x2-3   MYOMECTOMY     TUBAL LIGATION     at c-section    Family History  Problem Relation Age of Onset   Hypertension Mother    Stomach cancer Mother    Alzheimer's disease Sister    Colon cancer Neg Hx    Colon polyps Neg Hx    Esophageal cancer Neg Hx    Rectal cancer Neg Hx     Social History   Socioeconomic History   Marital status: Married    Spouse name: Not on file   Number of children: 2   Years of education: Not on file   Highest education level: Bachelor's degree (e.g., BA, AB, BS)   Occupational History   Occupation: Tool crib assistant  Tobacco Use   Smoking status: Never   Smokeless tobacco: Never  Vaping Use   Vaping status: Never Used  Substance and Sexual Activity   Alcohol use: No   Drug use: No   Sexual activity: Not Currently    Birth control/protection: Surgical  Other Topics Concern   Not on file  Social History Narrative   Lives with husband who has diabetes.    Social Drivers of Corporate Investment Banker Strain: High Risk (04/17/2024)   Overall Financial Resource Strain (CARDIA)    Difficulty of Paying Living Expenses: Very hard  Food Insecurity: No Food Insecurity (04/17/2024)   Hunger Vital Sign    Worried About Running Out of Food in the Last Year: Never true    Ran Out of Food in the Last Year: Never true  Transportation Needs: No Transportation Needs (04/17/2024)   PRAPARE - Administrator, Civil Service (Medical): No    Lack of Transportation (Non-Medical): No  Physical Activity: Insufficiently Active (04/17/2024)   Exercise Vital Sign    Days  of Exercise per Week: 4 days    Minutes of Exercise per Session: 30 min  Stress: No Stress Concern Present (04/17/2024)   [employer] of Occupational Health - Occupational Stress Questionnaire    Feeling of Stress: Only a little  Social Connections: Socially Integrated (04/17/2024)   Social Connection and Isolation Panel    Frequency of Communication with Friends and Family: Three times a week    Frequency of Social Gatherings with Friends and Family: Once a week    Attends Religious Services: More than 4 times per year    Active Member of [employer] or Organizations: Yes    Attends Engineer, Structural: More than 4 times per year    Marital Status: Married    Current Medications:  Current Outpatient Medications:    atorvastatin  (LIPITOR) 40 MG tablet, Take 1 tablet (40 mg total) by mouth daily., Disp: 90 tablet, Rfl: 0   Blood Pressure Monitor DEVI, Use as directed to  check home blood pressure 2-3 times a week, Disp: 1 each, Rfl: 0   glucose blood (TRUE METRIX BLOOD GLUCOSE TEST) test strip, Use as instructed to check blood sugar up to 3 times daily., Disp: 100 each, Rfl: 2   levothyroxine  (SYNTHROID ) 50 MCG tablet, Take 1 tablet (50 mcg total) by mouth daily.Please make an appointment., Disp: 90 tablet, Rfl: 1   losartan  (COZAAR ) 25 MG tablet, Take 1 tablet (25 mg total) by mouth daily., Disp: 90 tablet, Rfl: 1   metFORMIN  (GLUCOPHAGE -XR) 500 MG 24 hr tablet, Take 2 tablets (1,000 mg total) by mouth daily., Disp: 180 tablet, Rfl: 1   metoprolol  succinate (TOPROL -XL) 25 MG 24 hr tablet, Take 1 tablet (25 mg total) by mouth daily., Disp: 90 tablet, Rfl: 1   Multiple Vitamin (MULTIVITAMIN ADULT PO), Take by mouth. (Patient not taking: Reported on 04/28/2024), Disp: , Rfl:    Polyvinyl Alcohol-Povidone (REFRESH OP), Place 1 drop into both eyes daily as needed (dry eyes)., Disp: , Rfl:    sertraline  (ZOLOFT ) 100 MG tablet, Take 1 tablet (100 mg total) by mouth daily., Disp: 90 tablet, Rfl: 1   SUMAtriptan  (IMITREX ) 50 MG tablet, 1 tablet by mouth at start of headache may repeat x1 in 2 hours if headache persists. Max 2 tablets/24hrs (Patient taking differently: 1 tablet by mouth at start of headache may repeat x1 in 2 hours if headache persists. Max 2 tablets/24hrs as needed), Disp: 10 tablet, Rfl: 2   topiramate  (TOPAMAX ) 50 MG tablet, Take 1 tablet (50 mg total) by mouth daily. Take at bedtime, Disp: 30 tablet, Rfl: 2   TRUEPLUS LANCETS 28G MISC, Use to check blood sugar up to 3 times daily., Disp: 100 each, Rfl: 2  Review of Symptoms: Pertinent positives as per HPI.  Physical Exam: Deferred given limitations of phone visit.  Laboratory & Radiologic Studies: Liver MRI 04/22/24:   IMPRESSION: 1. 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 2. No additional  enhancing hepatic lesions identified.  Assessment & Plan: Viana Sleep is a 63 y.o. woman with fibroid uterus, complex adnexal masses bilaterally.   Doing well.  Discussed plan for surgery again.  Patient continues to desire keeping her uterus and cervix if not necessary to remove at the time of surgery.  Given mildly thickened endometrium on recent ultrasound, I recommended at least an endometrial biopsy on the day of surgery.  Discussed specifically risk of adhesions given her  surgical history and that this may increase the risk of damage to surrounding organs.  We discussed plan for surgery which will include endometrial sampling, robotic bilateral salpingo-oophorectomy, possible total hysterectomy, possible staging, possible laparotomy.  The risks of surgery were discussed in detail and she understands these to include infection; wound separation; hernia; vaginal cuff separation, injury to adjacent organs such as bowel, bladder, blood vessels, ureters and nerves; bleeding which may require blood transfusion; anesthesia risk; thromboembolic events; possible death; unforeseen complications; possible need for re-exploration; medical complications such as heart attack, stroke, pleural effusion and pneumonia. The patient will receive DVT and antibiotic prophylaxis as indicated. She voiced a clear understanding. She had the opportunity to ask questions. Perioperative instructions were reviewed with her. Prescriptions for post-op medications were sent to her pharmacy of choice.  I discussed the assessment and treatment plan with the patient. The patient was provided with an opportunity to ask questions and all were answered. The patient agreed with the plan and demonstrated an understanding of the instructions.   The patient was advised to call back or see an in-person evaluation if the symptoms worsen or if the condition fails to improve as anticipated.   22 minutes of total time was spent for this  patient encounter, including preparation, phone counseling with the patient and coordination of care, and documentation of the encounter.   Comer Dollar, MD  Division of Gynecologic Oncology  Department of Obstetrics and Gynecology  [facility]

## 2024-05-06 ENCOUNTER — Other Ambulatory Visit: Payer: Self-pay | Admitting: Internal Medicine

## 2024-05-06 DIAGNOSIS — E1159 Type 2 diabetes mellitus with other circulatory complications: Secondary | ICD-10-CM

## 2024-05-07 MED ORDER — LOSARTAN POTASSIUM 25 MG PO TABS
25.0000 mg | ORAL_TABLET | Freq: Every day | ORAL | 1 refills | Status: AC
  Filled 2024-05-07: qty 90, 90d supply, fill #0

## 2024-05-08 ENCOUNTER — Other Ambulatory Visit: Payer: Self-pay

## 2024-05-08 ENCOUNTER — Ambulatory Visit: Payer: Self-pay | Admitting: *Deleted

## 2024-05-08 NOTE — Telephone Encounter (Signed)
-----   Message from Eleanor JONETTA Epps sent at 05/08/2024  8:05 AM EST ----- Please call the [facility] lab at [phone] and see if we are able to add on a differential to her CBC from 11/20. May be too late.   Also please fax CBC and metabolic panel to PCP. Would contact patient and let her know her creatinine is elevated at 1.37. 2 mths ago she was at 1.19. Would make sure she follows up with PCP for this  and is staying hydrated but not drinking excessively since may have underlying kidney disease. Avoid NSAIDS. ----- Message ----- From: Rebecka, Lab In [place] Sent: 05/04/2024  11:27 AM EST To: Eleanor JONETTA Epps, NP

## 2024-05-08 NOTE — Telephone Encounter (Signed)
 Spoke with patient and relayed message from Eleanor Epps, NP that her patient's creatinine is elevated at 1.37. Two months ago it was 1.19  Recommendations are to follow up with PCP and we have faxed these results to Dr. Vicci. Advised patient to stay well hydrated but do not drink excessively since you may have underlying kidney disease. And avoid all NSAIDS. Pt verbalized understanding and states she is not taking any NSAIDS.

## 2024-05-09 ENCOUNTER — Other Ambulatory Visit: Payer: Self-pay

## 2024-05-09 ENCOUNTER — Inpatient Hospital Stay (HOSPITAL_BASED_OUTPATIENT_CLINIC_OR_DEPARTMENT_OTHER): Admitting: Gynecologic Oncology

## 2024-05-09 DIAGNOSIS — N83209 Unspecified ovarian cyst, unspecified side: Secondary | ICD-10-CM

## 2024-05-09 MED ORDER — TRAMADOL HCL 50 MG PO TABS
50.0000 mg | ORAL_TABLET | Freq: Four times a day (QID) | ORAL | 0 refills | Status: AC | PRN
  Filled 2024-05-09: qty 10, 3d supply, fill #0

## 2024-05-09 MED ORDER — SENNOSIDES-DOCUSATE SODIUM 8.6-50 MG PO TABS
2.0000 | ORAL_TABLET | Freq: Every day | ORAL | 0 refills | Status: AC
  Filled 2024-05-09: qty 30, 15d supply, fill #0

## 2024-05-09 NOTE — Progress Notes (Signed)
 Patient contacted via telephone for a pre-operative appointment prior to her scheduled surgery on 05/17/2024. She is scheduled for endometrial sampling, robotic assisted laparoscopic bilateral salpingo-oophorectomy (removal of both ovaries and fallopian tubes), possible robotic assisted laparoscopic total hysterectomy (removal of the uterus and cervix), possible laparotomy. She has had her pre-admission testing appointment at [facility].  The surgery was discussed in detail.  See after visit summary for additional details from 04/14/24 visit.     Discussed post-op pain management in detail including the aspects of the enhanced recovery pathway.  Advised her that a new prescription would be sent in for tramadol  and it is only to be used for after her upcoming surgery.  We discussed the use of tylenol  post-op and to monitor for a maximum of 4,000 mg in a 24 hour period.  Also prescribed sennakot to be used after surgery and to hold if having loose stools.  Discussed bowel regimen in detail.     Discussed measures to take at home to prevent DVT including frequent mobility.  Reportable signs and symptoms of DVT discussed. Post-operative instructions discussed and expectations for after surgery. Incisional care discussed.    10 minutes spent with the patient on the phone and preparing information.  Verbalizing understanding of material discussed. No needs or concerns voiced at the end of the visit.   Advised patient to call for any needs.  Advised that her post-operative medications had been prescribed and could be picked up at any time.    This appointment is included in the global surgical bundle as pre-operative teaching and has no charge.

## 2024-05-10 ENCOUNTER — Other Ambulatory Visit: Payer: Self-pay

## 2024-05-10 ENCOUNTER — Telehealth (HOSPITAL_COMMUNITY): Payer: Self-pay | Admitting: *Deleted

## 2024-05-10 DIAGNOSIS — R0609 Other forms of dyspnea: Secondary | ICD-10-CM

## 2024-05-10 DIAGNOSIS — I10 Essential (primary) hypertension: Secondary | ICD-10-CM

## 2024-05-10 NOTE — Telephone Encounter (Signed)
 Left message on voicemail per DPR in reference to upcoming appointment scheduled on 05/15/2024 at 8:00 with detailed instructions given per Myocardial Perfusion Study Information Sheet for the test. LM to arrive 15 minutes early, and that it is imperative to arrive on time for appointment to keep from having the test rescheduled. If you need to cancel or reschedule your appointment, please call the office within 24 hours of your appointment. Failure to do so may result in a cancellation of your appointment, and a $50 no show fee. Phone number given for call back for any questions.

## 2024-05-15 ENCOUNTER — Telehealth: Payer: Self-pay

## 2024-05-15 ENCOUNTER — Other Ambulatory Visit: Payer: Self-pay

## 2024-05-15 ENCOUNTER — Ambulatory Visit (HOSPITAL_COMMUNITY)
Admission: RE | Admit: 2024-05-15 | Discharge: 2024-05-15 | Disposition: A | Discharge status: Home / Self Care | Source: Ambulatory Visit

## 2024-05-15 DIAGNOSIS — R0609 Other forms of dyspnea: Secondary | ICD-10-CM | POA: Diagnosis not present

## 2024-05-15 DIAGNOSIS — I10 Essential (primary) hypertension: Secondary | ICD-10-CM | POA: Diagnosis not present

## 2024-05-15 MED ORDER — TECHNETIUM TC 99M TETROFOSMIN IV KIT
31.1000 | PACK | Freq: Once | INTRAVENOUS | Status: AC | PRN
  Administered 2024-05-15: 31.1 via INTRAVENOUS

## 2024-05-15 MED ORDER — TECHNETIUM TC 99M TETROFOSMIN IV KIT
10.3000 | PACK | Freq: Once | INTRAVENOUS | Status: AC | PRN
  Administered 2024-05-15: 10.3 via INTRAVENOUS

## 2024-05-15 NOTE — Telephone Encounter (Signed)
  Patient is calling to follow up stress test result. She just checking in since her procedure is in Wednesday 05/17/24

## 2024-05-16 ENCOUNTER — Telehealth: Payer: Self-pay | Admitting: *Deleted

## 2024-05-16 LAB — MYOCARDIAL PERFUSION IMAGING
Angina Index: 0
Duke Treadmill Score: 4
Estimated workload: 6.3
Exercise duration (min): 4 min
Exercise duration (sec): 27 s
LV dias vol: 87 mL (ref 46–106)
LV sys vol: 33 mL (ref 3.8–5.2)
MPHR: 157 {beats}/min
Nuc Stress EF: 62 %
Peak HR: 92 {beats}/min
Percent HR: 100 %
RPE: 19
Rest HR: 81 {beats}/min
Rest Nuclear Isotope Dose: 10.3 mCi
SDS: 2
SRS: 3
SSS: 4
ST Depression (mm): 0 mm
Stress Nuclear Isotope Dose: 31.1 mCi
TID: 0.91

## 2024-05-16 NOTE — Telephone Encounter (Signed)
 Telephone call to check on pre-operative status.  Patient compliant with pre-operative instructions.  Reinforced nothing to eat after midnight. Clear liquids until 0415. Patient to arrive at 0515.  No questions or concerns voiced.  Instructed to call for any needs.

## 2024-05-16 NOTE — Telephone Encounter (Signed)
 Surgeons office calling to check status of clearance, procedure is tomorrow 12/03. Need asap

## 2024-05-16 NOTE — Progress Notes (Signed)
 Anesthesia Chart Review   Case: [id number] Date/Time: 05/17/24 0815   Procedures:      SALPINGO-OOPHORECTOMY, BILATERAL, ROBOT-ASSISTED (Bilateral) - POSSIBLE STAGING, POSSIBLE LAPAROTOMY     HYSTERECTOMY, TOTAL, ROBOT-ASSISTED     DILATION AND CURETTAGE - VS. ENDOMETRIAL BIOPSY   Anesthesia type: General   Pre-op diagnosis: N94.89 ADNEXAL MASS, D25.9 FIBROID UTERUS   Location: [facility] ROOM 05 / [facility]   Surgeons: Viktoria Comer SAUNDERS, MD       DISCUSSION:63 y.o. never smoker with h/o HTN, hypothyroidism, CKD, DVT 2007, adnexal mass, fibroid scheduled for above procedure 05/17/2024 with Dr. Comer Viktoria.   Pt seen by cardiology 04/28/2024. At this visit pt reported worsening dyspnea with exertion.  Stress test and Echo ordered.   Echo with no acute findings, normal EF, no valvular problems.   Stress test 05/15/2024 low risk study.   VS: BP 112/79   Pulse 73   Temp 36.9 C (Oral)   Resp 16   Ht 5' 6 (1.676 m)   Wt 89.8 kg   SpO2 98%   BMI 31.96 kg/m   PROVIDERS: Vicci Barnie NOVAK, MD is PCP    LABS: Labs reviewed: Acceptable for surgery. (all labs ordered are listed, but only abnormal results are displayed)  Labs Reviewed  HEMOGLOBIN A1C - Abnormal; Notable for the following components:      Result Value   Hgb A1c MFr Bld 6.0 (*)    All other components within normal limits  CBC - Abnormal; Notable for the following components:   WBC 3.9 (*)    All other components within normal limits  COMPREHENSIVE METABOLIC PANEL WITH GFR - Abnormal; Notable for the following components:   Glucose, Bld 112 (*)    Creatinine, Ser 1.37 (*)    GFR, Estimated 43 (*)    All other components within normal limits  GLUCOSE, CAPILLARY - Abnormal; Notable for the following components:   Glucose-Capillary 100 (*)    All other components within normal limits  TYPE AND SCREEN     IMAGES:   EKG:   CV: Echo 05/01/2024  1. Left ventricular ejection fraction, by estimation, is 55%.  The left  ventricle has normal function. The left ventricle has no regional wall  motion abnormalities. Left ventricular diastolic parameters are consistent  with Grade I diastolic dysfunction  (impaired relaxation). The average left ventricular global longitudinal  strain is -23.5 %. The global longitudinal strain is normal.   2. Right ventricular systolic function is normal. The right ventricular  size is normal. There is normal pulmonary artery systolic pressure. The  estimated right ventricular systolic pressure is 25.1 mmHg.   3. The mitral valve is normal in structure. Mild mitral valve  regurgitation. No evidence of mitral stenosis.   4. The aortic valve is tricuspid. Aortic valve regurgitation is trivial.  No aortic stenosis is present.   5. Aortic dilatation noted.   6. The inferior vena cava is normal in size with greater than 50%  respiratory variability, suggesting right atrial pressure of 3 mmHg.   Past Medical History:  Diagnosis Date   Anemia Dx 2003   Arthritis Dx 1999   Chronic kidney disease    Depression    DVT (deep venous thrombosis) (HCC) 2007   got Lupron to shrink fibroids, developed DVT after (blood thinner for 6 months)   Dyspnea    Fibroids    Gestational diabetes    Headache    Hyperlipidemia Dx 2000   Hypertension Dx 2000  Hypertensive retinopathy    Hypothyroidism    Thyroid  disease Dx 2005    Past Surgical History:  Procedure Laterality Date   CESAREAN SECTION     MYOMECTOMY     x2-3   MYOMECTOMY     TUBAL LIGATION     at c-section    MEDICATIONS:  atorvastatin  (LIPITOR) 40 MG tablet   Blood Pressure Monitor DEVI   glucose blood (TRUE METRIX BLOOD GLUCOSE TEST) test strip   levothyroxine  (SYNTHROID ) 50 MCG tablet   losartan  (COZAAR ) 25 MG tablet   metFORMIN  (GLUCOPHAGE -XR) 500 MG 24 hr tablet   metoprolol  succinate (TOPROL -XL) 25 MG 24 hr tablet   Multiple Vitamin (MULTIVITAMIN ADULT PO)   Polyvinyl Alcohol-Povidone (REFRESH OP)    senna-docusate (SENOKOT-S) 8.6-50 MG tablet   sertraline  (ZOLOFT ) 100 MG tablet   SUMAtriptan  (IMITREX ) 50 MG tablet   topiramate  (TOPAMAX ) 50 MG tablet   traMADol  (ULTRAM ) 50 MG tablet   TRUEPLUS LANCETS 28G MISC   No current facility-administered medications for this encounter.    Harlene Hoots Ward, PA-C [facility] Pre-Surgical Testing [phone]

## 2024-05-16 NOTE — Anesthesia Preprocedure Evaluation (Signed)
 Anesthesia Evaluation  Patient identified by MRN, date of birth, ID band Patient awake    Reviewed: Allergy & Precautions, NPO status , Patient's Chart, lab work & pertinent test results, reviewed documented beta blocker date and time   Airway Mallampati: II  TM Distance: >3 FB Neck ROM: Full    Dental no notable dental hx. (+) Dental Advisory Given   Pulmonary shortness of breath and with exertion   Pulmonary exam normal breath sounds clear to auscultation       Cardiovascular hypertension, Pt. on medications and Pt. on home beta blockers Normal cardiovascular exam Rhythm:Regular Rate:Normal  Stress 05/2024   The study is normal. The study is low risk.   A Bruce protocol stress test was performed. Exercise capacity was mildly impaired. Patient exercised for 4 min and 27 sec. Maximum HR of 92 bpm. MPHR 100.0%. Peak METS 6.3. The patient experienced no angina during the test. The test was stopped because the patient experienced fatigue and dyspnea. The patient reported dyspnea and fatigue during the stress test. Onset of symptoms occurred at stage 2 of the protocol. Symptoms began at minute 3 during stress and ended at minute 5 during recovery. Elevated blood pressure and normal heart rate response noted during stress. Heart rate recovery was normal.   No ST deviation was noted. The ECG was negative for ischemia.   LV perfusion is normal. There is no evidence of ischemia. There is no evidence of infarction.   Left ventricular function is normal. Nuclear stress EF: 62%. The left ventricular ejection fraction is normal (55-65%). End diastolic cavity size is normal. End systolic cavity size is normal. No evidence of transient ischemic dilation (TID) noted.   CT images were obtained for attenuation correction and were examined for the presence of coronary calcium  when appropriate.   Coronary calcium  was absent on the attenuation correction  CT images. There are mild aortic valve and aortic root calcifications.   Prior study not available for comparison.    Echo 04/2024  1. Left ventricular ejection fraction, by estimation, is 55%. The left  ventricle has normal function. The left ventricle has no regional wall  motion abnormalities. Left ventricular diastolic parameters are consistent  with Grade I diastolic dysfunction  (impaired relaxation). The average left ventricular global longitudinal  strain is -23.5 %. The global longitudinal strain is normal.   2. Right ventricular systolic function is normal. The right ventricular  size is normal. There is normal pulmonary artery systolic pressure. The  estimated right ventricular systolic pressure is 25.1 mmHg.   3. The mitral valve is normal in structure. Mild mitral valve  regurgitation. No evidence of mitral stenosis.   4. The aortic valve is tricuspid. Aortic valve regurgitation is trivial.  No aortic stenosis is present.   5. Aortic dilatation noted.   6. The inferior vena cava is normal in size with greater than 50%  respiratory variability, suggesting right atrial pressure of 3 mmHg.       Neuro/Psych  Headaches PSYCHIATRIC DISORDERS  Depression       GI/Hepatic negative GI ROS, Neg liver ROS,neg GERD  ,,  Endo/Other  diabetesHypothyroidism    Renal/GU Renal disease     Musculoskeletal  (+) Arthritis ,    Abdominal  (+) + obese  Peds  Hematology  (+) Blood dyscrasia, anemia   Anesthesia Other Findings   Reproductive/Obstetrics  Anesthesia Physical Anesthesia Plan  ASA: 2  Anesthesia Plan: General   Post-op Pain Management: Tylenol  PO (pre-op)*, Gabapentin  PO (pre-op)* and Toradol  IV (intra-op)*   Induction: Intravenous  PONV Risk Score and Plan: 4 or greater and Treatment may vary due to age or medical condition, Dexamethasone and Ondansetron   Airway Management Planned: Oral  ETT  Additional Equipment:   Intra-op Plan:   Post-operative Plan: Extubation in OR  Informed Consent: I have reviewed the patients History and Physical, chart, labs and discussed the procedure including the risks, benefits and alternatives for the proposed anesthesia with the patient or authorized representative who has indicated his/her understanding and acceptance.     Dental advisory given  Plan Discussed with: CRNA  Anesthesia Plan Comments: (2 x PIV)         Anesthesia Quick Evaluation

## 2024-05-17 ENCOUNTER — Encounter (HOSPITAL_COMMUNITY): Payer: Self-pay | Admitting: Gynecologic Oncology

## 2024-05-17 ENCOUNTER — Ambulatory Visit (HOSPITAL_COMMUNITY)
Admission: RE | Admit: 2024-05-17 | Discharge: 2024-05-17 | Disposition: A | Discharge status: Home / Self Care | Source: Ambulatory Visit | Attending: Gynecologic Oncology | Admitting: Gynecologic Oncology

## 2024-05-17 ENCOUNTER — Ambulatory Visit (HOSPITAL_COMMUNITY): Payer: Self-pay | Admitting: Physician Assistant

## 2024-05-17 ENCOUNTER — Encounter (HOSPITAL_COMMUNITY)
Admission: RE | Disposition: A | Discharge status: Home / Self Care | Payer: Self-pay | Source: Ambulatory Visit | Attending: Gynecologic Oncology

## 2024-05-17 ENCOUNTER — Ambulatory Visit: Payer: Self-pay

## 2024-05-17 ENCOUNTER — Ambulatory Visit (HOSPITAL_COMMUNITY): Payer: Self-pay | Admitting: Anesthesiology

## 2024-05-17 DIAGNOSIS — M1990 Unspecified osteoarthritis, unspecified site: Secondary | ICD-10-CM | POA: Diagnosis not present

## 2024-05-17 DIAGNOSIS — N189 Chronic kidney disease, unspecified: Secondary | ICD-10-CM | POA: Insufficient documentation

## 2024-05-17 DIAGNOSIS — D270 Benign neoplasm of right ovary: Secondary | ICD-10-CM

## 2024-05-17 DIAGNOSIS — F32A Depression, unspecified: Secondary | ICD-10-CM | POA: Insufficient documentation

## 2024-05-17 DIAGNOSIS — R9389 Abnormal findings on diagnostic imaging of other specified body structures: Secondary | ICD-10-CM

## 2024-05-17 DIAGNOSIS — D271 Benign neoplasm of left ovary: Secondary | ICD-10-CM

## 2024-05-17 DIAGNOSIS — D369 Benign neoplasm, unspecified site: Secondary | ICD-10-CM | POA: Insufficient documentation

## 2024-05-17 DIAGNOSIS — D259 Leiomyoma of uterus, unspecified: Secondary | ICD-10-CM | POA: Diagnosis present

## 2024-05-17 DIAGNOSIS — E1122 Type 2 diabetes mellitus with diabetic chronic kidney disease: Secondary | ICD-10-CM | POA: Diagnosis not present

## 2024-05-17 DIAGNOSIS — Z8249 Family history of ischemic heart disease and other diseases of the circulatory system: Secondary | ICD-10-CM | POA: Diagnosis not present

## 2024-05-17 DIAGNOSIS — Z79899 Other long term (current) drug therapy: Secondary | ICD-10-CM | POA: Diagnosis not present

## 2024-05-17 DIAGNOSIS — Z59868 Other specified financial insecurity: Secondary | ICD-10-CM | POA: Diagnosis not present

## 2024-05-17 DIAGNOSIS — R7989 Other specified abnormal findings of blood chemistry: Secondary | ICD-10-CM

## 2024-05-17 DIAGNOSIS — I129 Hypertensive chronic kidney disease with stage 1 through stage 4 chronic kidney disease, or unspecified chronic kidney disease: Secondary | ICD-10-CM | POA: Diagnosis not present

## 2024-05-17 DIAGNOSIS — E039 Hypothyroidism, unspecified: Secondary | ICD-10-CM | POA: Diagnosis not present

## 2024-05-17 DIAGNOSIS — N80101 Endometriosis of right ovary, unspecified depth: Secondary | ICD-10-CM | POA: Insufficient documentation

## 2024-05-17 DIAGNOSIS — Z7984 Long term (current) use of oral hypoglycemic drugs: Secondary | ICD-10-CM | POA: Insufficient documentation

## 2024-05-17 DIAGNOSIS — N841 Polyp of cervix uteri: Secondary | ICD-10-CM | POA: Diagnosis not present

## 2024-05-17 DIAGNOSIS — N9489 Other specified conditions associated with female genital organs and menstrual cycle: Secondary | ICD-10-CM

## 2024-05-17 HISTORY — PX: ROBOTIC ASSISTED BILATERAL SALPINGO OOPHERECTOMY: SHX6078

## 2024-05-17 HISTORY — PX: DILATION AND CURETTAGE OF UTERUS: SHX78

## 2024-05-17 LAB — BASIC METABOLIC PANEL WITH GFR
Anion gap: 9 (ref 5–15)
BUN: 16 mg/dL (ref 8–23)
CO2: 23 mmol/L (ref 22–32)
Calcium: 9.3 mg/dL (ref 8.9–10.3)
Chloride: 110 mmol/L (ref 98–111)
Creatinine, Ser: 1.28 mg/dL — ABNORMAL HIGH (ref 0.44–1.00)
GFR, Estimated: 47 mL/min — ABNORMAL LOW (ref >60)
Glucose, Bld: 153 mg/dL — ABNORMAL HIGH (ref 70–99)
Potassium: 4.2 mmol/L (ref 3.5–5.1)
Sodium: 141 mmol/L (ref 135–145)

## 2024-05-17 LAB — GLUCOSE, CAPILLARY
Glucose-Capillary: 108 mg/dL — ABNORMAL HIGH (ref 70–99)
Glucose-Capillary: 123 mg/dL — ABNORMAL HIGH (ref 70–99)

## 2024-05-17 LAB — ABO/RH: ABO/RH(D): B POS

## 2024-05-17 MED ORDER — PROPOFOL 10 MG/ML IV BOLUS
INTRAVENOUS | Status: AC
  Filled 2024-05-17: qty 20

## 2024-05-17 MED ORDER — DEXMEDETOMIDINE HCL IN NACL 80 MCG/20ML IV SOLN
INTRAVENOUS | Status: AC
  Filled 2024-05-17: qty 20

## 2024-05-17 MED ORDER — ONDANSETRON HCL 4 MG/2ML IJ SOLN
INTRAMUSCULAR | Status: DC | PRN
  Administered 2024-05-17: 4 mg via INTRAVENOUS

## 2024-05-17 MED ORDER — DEXAMETHASONE SOD PHOSPHATE PF 10 MG/ML IJ SOLN: 4.0000 mg | INTRAMUSCULAR | Status: DC

## 2024-05-17 MED ORDER — PHENYLEPHRINE HCL-NACL 20-0.9 MG/250ML-% IV SOLN
INTRAVENOUS | Status: DC | PRN
  Administered 2024-05-17: 30 ug/min via INTRAVENOUS

## 2024-05-17 MED ORDER — EPHEDRINE 5 MG/ML INJ
INTRAVENOUS | Status: AC
  Filled 2024-05-17: qty 5

## 2024-05-17 MED ORDER — HYDROMORPHONE HCL 1 MG/ML IJ SOLN: 0.2500 mg | INTRAMUSCULAR | Status: DC | PRN

## 2024-05-17 MED ORDER — ROCURONIUM BROMIDE 100 MG/10ML IV SOLN
INTRAVENOUS | Status: DC | PRN
  Administered 2024-05-17: 80 mg via INTRAVENOUS
  Administered 2024-05-17: 10 mg via INTRAVENOUS

## 2024-05-17 MED ORDER — ROCURONIUM BROMIDE 10 MG/ML (PF) SYRINGE
PREFILLED_SYRINGE | INTRAVENOUS | Status: AC
  Filled 2024-05-17: qty 10

## 2024-05-17 MED ORDER — STERILE WATER FOR INJECTION IJ SOLN: INTRAMUSCULAR | Status: DC | PRN

## 2024-05-17 MED ORDER — INSULIN ASPART 100 UNIT/ML IJ SOLN: 0.0000 [IU] | INTRAMUSCULAR | Status: DC | PRN

## 2024-05-17 MED ORDER — FENTANYL CITRATE (PF) 250 MCG/5ML IJ SOLN
INTRAMUSCULAR | Status: AC
  Filled 2024-05-17: qty 5

## 2024-05-17 MED ORDER — SILVER NITRATE-POT NITRATE 75-25 % EX MISC
CUTANEOUS | Status: AC
  Filled 2024-05-17: qty 10

## 2024-05-17 MED ORDER — PROPOFOL 10 MG/ML IV BOLUS
INTRAVENOUS | Status: DC | PRN
  Administered 2024-05-17: 140 mg via INTRAVENOUS

## 2024-05-17 MED ORDER — SUGAMMADEX SODIUM 200 MG/2ML IV SOLN
INTRAVENOUS | Status: AC
  Filled 2024-05-17: qty 2

## 2024-05-17 MED ORDER — ORAL CARE MOUTH RINSE: 15.0000 mL | Freq: Once | OROMUCOSAL | Status: AC

## 2024-05-17 MED ORDER — LACTATED RINGERS IV SOLN: INTRAVENOUS | Status: DC

## 2024-05-17 MED ORDER — STERILE WATER FOR IRRIGATION IR SOLN
Status: DC | PRN
  Administered 2024-05-17: 1000 mL

## 2024-05-17 MED ORDER — CHLORHEXIDINE GLUCONATE 0.12 % MT SOLN
15.0000 mL | Freq: Once | OROMUCOSAL | Status: AC
  Administered 2024-05-17: 15 mL via OROMUCOSAL

## 2024-05-17 MED ORDER — LIDOCAINE HCL (PF) 1 % IJ SOLN
INTRAMUSCULAR | Status: AC
  Filled 2024-05-17: qty 30

## 2024-05-17 MED ORDER — OXYCODONE HCL 5 MG PO TABS
ORAL_TABLET | ORAL | Status: AC
  Filled 2024-05-17: qty 1

## 2024-05-17 MED ORDER — ONDANSETRON HCL 4 MG/2ML IJ SOLN
INTRAMUSCULAR | Status: AC
  Filled 2024-05-17: qty 2

## 2024-05-17 MED ORDER — BUPIVACAINE HCL (PF) 0.25 % IJ SOLN
INTRAMUSCULAR | Status: AC
  Filled 2024-05-17: qty 30

## 2024-05-17 MED ORDER — LIDOCAINE HCL (CARDIAC) PF 100 MG/5ML IV SOSY
PREFILLED_SYRINGE | INTRAVENOUS | Status: DC | PRN
  Administered 2024-05-17: 50 mg via INTRAVENOUS

## 2024-05-17 MED ORDER — LACTATED RINGERS IR SOLN
Status: DC | PRN
  Administered 2024-05-17: 1000 mL

## 2024-05-17 MED ORDER — FENTANYL CITRATE (PF) 100 MCG/2ML IJ SOLN
INTRAMUSCULAR | Status: DC | PRN
  Administered 2024-05-17 (×2): 50 ug via INTRAVENOUS
  Administered 2024-05-17: 25 ug via INTRAVENOUS
  Administered 2024-05-17: 50 ug via INTRAVENOUS
  Administered 2024-05-17: 25 ug via INTRAVENOUS

## 2024-05-17 MED ORDER — GABAPENTIN 300 MG PO CAPS
300.0000 mg | ORAL_CAPSULE | Freq: Once | ORAL | Status: AC
  Administered 2024-05-17: 300 mg via ORAL
  Filled 2024-05-17: qty 1

## 2024-05-17 MED ORDER — SODIUM CHLORIDE 0.9 % IR SOLN
Status: DC | PRN
  Administered 2024-05-17: 3000 mL

## 2024-05-17 MED ORDER — HEPARIN SODIUM (PORCINE) 5000 UNIT/ML IJ SOLN
5000.0000 [IU] | INTRAMUSCULAR | Status: AC
  Administered 2024-05-17: 5000 [IU] via SUBCUTANEOUS
  Filled 2024-05-17: qty 1

## 2024-05-17 MED ORDER — DROPERIDOL 2.5 MG/ML IJ SOLN: 0.6250 mg | Freq: Once | INTRAMUSCULAR | Status: DC | PRN

## 2024-05-17 MED ORDER — MIDAZOLAM HCL 2 MG/2ML IJ SOLN
INTRAMUSCULAR | Status: AC
  Filled 2024-05-17: qty 2

## 2024-05-17 MED ORDER — DEXMEDETOMIDINE HCL IN NACL 80 MCG/20ML IV SOLN
INTRAVENOUS | Status: DC | PRN
  Administered 2024-05-17: 8 ug via INTRAVENOUS

## 2024-05-17 MED ORDER — DEXAMETHASONE SOD PHOSPHATE PF 10 MG/ML IJ SOLN
INTRAMUSCULAR | Status: DC | PRN
  Administered 2024-05-17: 4 mg via INTRAVENOUS

## 2024-05-17 MED ORDER — OXYCODONE HCL 5 MG PO TABS
5.0000 mg | ORAL_TABLET | Freq: Once | ORAL | Status: AC
  Administered 2024-05-17: 5 mg via ORAL

## 2024-05-17 MED ORDER — EPHEDRINE SULFATE (PRESSORS) 25 MG/5ML IV SOSY
PREFILLED_SYRINGE | INTRAVENOUS | Status: DC | PRN
  Administered 2024-05-17: 5 mg via INTRAVENOUS

## 2024-05-17 MED ORDER — ACETAMINOPHEN 500 MG PO TABS
1000.0000 mg | ORAL_TABLET | ORAL | Status: AC
  Administered 2024-05-17: 1000 mg via ORAL
  Filled 2024-05-17: qty 2

## 2024-05-17 MED ORDER — SUGAMMADEX SODIUM 200 MG/2ML IV SOLN
INTRAVENOUS | Status: DC | PRN
  Administered 2024-05-17: 200 mg via INTRAVENOUS
  Administered 2024-05-17: 50 mg via INTRAVENOUS

## 2024-05-17 MED ORDER — ACETAMINOPHEN 500 MG PO TABS
1000.0000 mg | ORAL_TABLET | Freq: Once | ORAL | Status: DC
  Filled 2024-05-17: qty 2

## 2024-05-17 MED ORDER — LIDOCAINE HCL (PF) 2 % IJ SOLN
INTRAMUSCULAR | Status: AC
  Filled 2024-05-17: qty 5

## 2024-05-17 MED ORDER — BUPIVACAINE HCL 0.25 % IJ SOLN
INTRAMUSCULAR | Status: DC | PRN
  Administered 2024-05-17: 30 mL

## 2024-05-17 MED ORDER — MIDAZOLAM HCL 5 MG/5ML IJ SOLN
INTRAMUSCULAR | Status: DC | PRN
  Administered 2024-05-17 (×2): .5 mg via INTRAVENOUS
  Administered 2024-05-17: 1 mg via INTRAVENOUS

## 2024-05-17 NOTE — Op Note (Signed)
 OPERATIVE NOTE  Pre-operative Diagnosis: Complex adnexal masses, fibroid uterus, thickened endometrium  Post-operative Diagnosis: Benign bilateral ovarian masses  Operation: Robotic-assisted laparoscopic bilateral salpingo-oophorectomy, endometrial biopsy, dilation and curettage with endometrial sampling under hysteroscopy  Surgeon: Viktoria Crank MD  Assistant Surgeon: Olam Leonce Ada MD (an MD assistant was necessary for tissue manipulation, management of robotic instrumentation, retraction and positioning due to the complexity of the case and hospital policies).   Anesthesia: GET  Urine Output: 200 cc  Operative Findings: Mildly enlarged mobile uterus, mobile bilateral masses in the posterior cul de sac. Cervix somewhat indurated (biopsy taken at 12 o'clock). Uterus sounded to 8 cm. On intra-abdominal entry, normal upper abdominal exam. Small amount of omentum adherent to the anterior abdominal wall below the midline in the RLQ quadrant. Normal omentum, small and large bowel, appendix. Bilateral ovaries enlarged, measuring 4-6 cm, smooth. No ascites. Uterus 8-10 cm with multiple intra-mural and subserosal fibroids, 1-3 cm. Some adhesions anteriorly between the LUS and bladder consistent with c-section. On frozen, biopsy of the cervix with benign glandular cells. EMB initially with benign stroma, no endometrium. On frozen section of the right ovary, benign ovarian cyst.  Hysteroscopy revealed atrophic endometrium with small endometrial cavity. Left tubal ostia visualized. Appeared that there was a tract at the fundus created by dilation and EMB (no perforation apparent intra-uterine or on laparoscopy), suspect stroma on frozen from the tract. Endometrial curettings obtained under direct visualization with the Myosure (scant benign endometrial glands on frozen).  Estimated Blood Loss:  50 cc      Total IV Fluids: see I&O flowsheet  Fluid deficit: unmeasured given spillage of fluid on  the floor.  No perforation confirmed both on hysteroscopy and also laparoscopic visualization.         Specimens: pelvic washings, bilateral tubes and ovaries, endometrial curettings, cervical biopsy         Complications:  None apparent; patient tolerated the procedure well.         Disposition: PACU - hemodynamically stable.  Procedure Details  The patient was seen in the Holding Room. The risks, benefits, complications, treatment options, and expected outcomes were discussed with the patient.  The patient concurred with the proposed plan, giving informed consent.  The site of surgery properly noted/marked. The patient was identified as Heather Bailey and the procedure verified as a Robotic-assisted bilateral salpingo oophorectomy, endometrial sampling, possible staging.   After induction of anesthesia, the patient was draped and prepped in the usual sterile manner. Patient was placed in supine position after anesthesia and draped and prepped in the usual sterile manner as follows: Her arms were tucked to her side with all appropriate precautions.  The patient was secured to the bed using padding and tape across her chest.  The patient was placed in the semi-lithotomy position in [place] stirrups.  The perineum and vagina were prepped with CHG. The patient's abdomen was prepped with ChloraPrep and then she was draped after the prep had been allowed to dry for 3 minutes.  A Time Out was held and the above information confirmed.  The urethra was prepped with Betadine. Foley catheter was placed.  A sterile speculum was placed in the vagina. A biopsy of the cervix was taken with Tischlers and sent for frozen. The cervix was grasped with a single-tooth tenaculum. The cervix was dilated with Fredirick dilators and an endometrial biopsy was performed with an Endometrial Pipelle. This was sent for frozen section. OG tube placement was confirmed and to suction.  Next, a 10 mm skin incision was made 1 cm below the  subcostal margin in the midclavicular line.  The 5 mm Optiview port and scope was used for direct entry.  Opening pressure was under 10 mm CO2.  The abdomen was insufflated and the findings were noted as above.   At this point and all points during the procedure, the patient's intra-abdominal pressure did not exceed 15 mmHg. Next, an 8 mm skin incision was made superior to the umbilicus and a right and left port were placed about 8 cm lateral to the robot port on the right and left side.  A fourth arm was placed on the right.  The 5 mm assist trocar was exchanged for a 12 mm airseal port. All ports were placed under direct visualization.  The patient was placed in steep Trendelenburg.  Bowel was folded away into the upper abdomen.  The robot was docked in the normal manner.  Omental adhesion was taken down with monopolar electrocautery.  The right and left peritoneum were opened parallel to the IP ligament to open the retroperitoneal spaces bilaterally. The round ligaments were preserved. The ureter was noted to be on the medial leaf of the broad ligament.  The peritoneum above the ureter was incised and stretched and the infundibulopelvic ligament was skeletonized, cauterized and cut.  The utero-ovarian ligament and fallopian tube were skeletonized, cauterized and transected just lateral to the uterine fundus, freeing the adnexa. Each adnexa was placed in a 10 mm Endocatch bag. On the right, the ovary was somewhat adherent to the right sidewall requiring opening of the retroperitoneum to identify the course of the ureter until below the level of the uterine artery. Irrigation was used and excellent hemostasis was achieved.  Intra-abdominal pressure was decreased to 5 mmHg with excellent hemostasis maintained.  Each adnexa was placed in an Endo Catch bag and removed through the left upper quadrant incision through a combination of decompression and morcellation in contained fashion.  The left was removed first  and handed off the field to be sent for frozen section.  Once the right adnexa was removed in similar fashion, attention was turned again below given no endometrial tissue seen on the endometrial biopsy.  Speculum was replaced in the vagina and the cervix was visualized.  Anterior lip of the cervix was again grasped with a tenaculum.  Cervix was dilated to a 55 French.  The MyoSure hysteroscope was then used to hydrodissect the cervix and visualize the endometrial cavity.  There was a tract that appears to have been made at the fundus although no perforation sustained.  Left tubal ostia visualized.  No clear right tubal ostia visualized.  MyoSure reach was then used to perform endometrial sampling although minimal tissue obtained given very atrophic appearing endometrium.  Hysteroscope was then removed.  Once the frozen results from both the right adnexa and endometrial curettings had resulted, the procedure was completed.  Robotic instruments were removed under direct visulaization.  The robot was undocked. The fascia at the 12 mm port was closed with 0 Vicryl using a PMI fascial closure device under direct visualization.  The subcuticular tissue was closed with 4-0 Vicryl and the skin was closed with 4-0 Monocryl in a subcuticular manner.  Dermabond was applied.    The vagina was swabbed with minimal bleeding noted. Foley catheter was removed.  All sponge, lap and needle counts were correct x  3.   The patient was transferred to the recovery room in stable  condition.  Comer Dollar, MD

## 2024-05-17 NOTE — Discharge Instructions (Addendum)
 AFTER SURGERY INSTRUCTIONS   Return to work: 4-6 weeks if applicable   Activity: 1. Be up and out of the bed during the day.  Take a nap if needed.  You may walk up steps but be careful and use the hand rail.  Stair climbing will tire you more than you think, you may need to stop part way and rest.    2. No lifting or straining for 6 weeks over 10 pounds. No pushing, pulling, straining for 6 weeks.   3. No driving for 4-89 days when the following criteria have been met: Do not drive if you are taking narcotic pain medicine and make sure that your reaction time has returned.    4. You can shower as soon as the next day after surgery. Shower daily.  Use your regular soap and water (not directly on the incision) and pat your incision(s) dry afterwards; don't rub.  No tub baths or submerging your body in water until cleared by your surgeon. If you have the soap that was given to you by pre-surgical testing that was used before surgery, you do not need to use it afterwards because this can irritate your incisions.    5. No sexual activity and nothing in the vagina for 6 weeks, 12 weeks if you have a hysterectomy (removal of the uterus and cervix).   6. You may experience a small amount of clear drainage from your incisions, which is normal.  If the drainage persists, increases, or changes color please call the office.   7. Do not use creams, lotions, or ointments such as neosporin on your incisions after surgery until advised by your surgeon because they can cause removal of the dermabond glue on your incisions.     8. You may experience vaginal spotting after surgery or when the stitches at the top of the vagina begin to dissolve.  The spotting is normal but if you experience heavy bleeding, call our office.   9. Take Tylenol  or ibuprofen first for pain if you are able to take these medications and only use narcotic pain medication for severe pain not relieved by the Tylenol  or Ibuprofen.  Monitor  your Tylenol  intake to a max of 4,000 mg in a 24 hour period. You can alternate these medications after surgery.   Diet: 1. Low sodium Heart Healthy Diet is recommended but you are cleared to resume your normal (before surgery) diet after your procedure.   2. It is safe to use a laxative, such as Miralax or Colace, if you have difficulty moving your bowels before surgery. You have been prescribed Sennakot-S to take at bedtime every evening after surgery to keep bowel movements regular and to prevent constipation.     Wound Care: 1. Keep clean and dry.  Shower daily.   Reasons to call the Doctor: Fever - Oral temperature greater than 100.4 degrees Fahrenheit Foul-smelling vaginal discharge Difficulty urinating Nausea and vomiting Increased pain at the site of the incision that is unrelieved with pain medicine. Difficulty breathing with or without chest pain New calf pain especially if only on one side Sudden, continuing increased vaginal bleeding with or without clots.   Contacts: For questions or concerns you should contact:   Dr. Comer Dollar at [phone]   Eleanor Epps, NP at [phone]   After Hours: call [phone] and have the GYN Oncologist paged/contacted (after 5 pm or on the weekends). You will speak with an after hours RN and let he or she know  you have had surgery.   Messages sent via mychart are for non-urgent matters and are not responded to after hours so for urgent needs, please call the after hours number.

## 2024-05-17 NOTE — Anesthesia Procedure Notes (Signed)
 Procedure Name: Intubation Date/Time: 05/17/2024 8:50 AM  Performed by: Kathern Rollene LABOR, CRNAPre-anesthesia Checklist: Patient identified, Emergency Drugs available, Suction available and Patient being monitored Patient Re-evaluated:Patient Re-evaluated prior to induction Oxygen Delivery Method: Circle system utilized Preoxygenation: Pre-oxygenation with 100% oxygen Induction Type: IV induction Ventilation: Mask ventilation without difficulty Laryngoscope Size: Glidescope and 3 Grade View: Grade II Tube type: Oral Tube size: 7.0 mm Number of attempts: 1 Airway Equipment and Method: Stylet and Oral airway Placement Confirmation: ETT inserted through vocal cords under direct vision, positive ETCO2 and breath sounds checked- equal and bilateral Secured at: 22 cm Tube secured with: Tape Dental Injury: Teeth and Oropharynx as per pre-operative assessment  Difficulty Due To: Difficulty was anticipated, Difficult Airway- due to anterior larynx and Difficult Airway- due to dentition

## 2024-05-17 NOTE — Transfer of Care (Signed)
 Immediate Anesthesia Transfer of Care Note  Patient: Heather Bailey  Procedure(s) Performed: SALPINGO-OOPHORECTOMY, BILATERAL, ROBOT-ASSISTED (Bilateral: Abdomen) HYSTEROSCOPY, DIAGNOSTIC (Perineum) DILATION AND CURETTAGE (Perineum)  Patient Location: PACU  Anesthesia Type:General  Level of Consciousness: oriented, drowsy, and patient cooperative  Airway & Oxygen Therapy: Patient Spontanous Breathing and Patient connected to face mask oxygen  Post-op Assessment: Report given to RN and Post -op Vital signs reviewed and stable  Post vital signs: Reviewed and stable  Last Vitals:  Vitals Value Taken Time  BP 145/96 05/17/24 11:30  Temp    Pulse 91 05/17/24 11:30  Resp 23 05/17/24 11:30  SpO2 89 % 05/17/24 11:30  Vitals shown include unfiled device data.  Last Pain:  Vitals:   05/17/24 0642  TempSrc: Oral  PainSc: 0-No pain         Complications: No notable events documented.

## 2024-05-17 NOTE — Interval H&P Note (Signed)
 History and Physical Interval Note:  05/17/2024 7:04 AM  Heather Bailey  has presented today for surgery, with the diagnosis of N94.89 ADNEXAL MASS, D25.9 FIBROID UTERUS.  The various methods of treatment have been discussed with the patient and family. After consideration of risks, benefits and other options for treatment, the patient has consented to  Procedure(s) with comments: SALPINGO-OOPHORECTOMY, BILATERAL, ROBOT-ASSISTED (Bilateral) - POSSIBLE STAGING, POSSIBLE LAPAROTOMY HYSTERECTOMY, TOTAL, ROBOT-ASSISTED (N/A) DILATION AND CURETTAGE (N/A) - VS. ENDOMETRIAL BIOPSY as a surgical intervention.  The patient's history has been reviewed, patient examined, no change in status, stable for surgery.  I have reviewed the patient's chart and labs.  Questions were answered to the patient's satisfaction.     [name]

## 2024-05-17 NOTE — Anesthesia Postprocedure Evaluation (Signed)
 Anesthesia Post Note  Patient: Heather Bailey  Procedure(s) Performed: SALPINGO-OOPHORECTOMY, BILATERAL, ROBOT-ASSISTED (Bilateral: Abdomen) HYSTEROSCOPY, DIAGNOSTIC (Perineum) DILATION AND CURETTAGE (Perineum)     Patient location during evaluation: PACU Anesthesia Type: General Level of consciousness: sedated and patient cooperative Pain management: pain level controlled Vital Signs Assessment: post-procedure vital signs reviewed and stable Respiratory status: spontaneous breathing Cardiovascular status: stable Anesthetic complications: no   No notable events documented.  Last Vitals:  Vitals:   05/17/24 1415 05/17/24 1430  BP: 121/81 125/84  Pulse: 68 75  Resp:  14  Temp:    SpO2: 95% 94%    Last Pain:  Vitals:   05/17/24 1449  TempSrc:   PainSc: 6                  Norleen Pope

## 2024-05-18 ENCOUNTER — Telehealth: Payer: Self-pay | Admitting: *Deleted

## 2024-05-18 ENCOUNTER — Encounter (HOSPITAL_COMMUNITY): Payer: Self-pay | Admitting: Gynecologic Oncology

## 2024-05-18 ENCOUNTER — Ambulatory Visit: Payer: Self-pay | Admitting: Gynecologic Oncology

## 2024-05-18 LAB — CYTOLOGY - NON PAP

## 2024-05-18 LAB — SURGICAL PATHOLOGY

## 2024-05-18 NOTE — Telephone Encounter (Signed)
 Spoke with Heather Bailey this morning. She states she is eating, drinking and urinating well. She has not had a BM yet but is passing gas. She is taking senokot as prescribed and encouraged her to drink plenty of water . She denies fever or chills. Incisions are dry and intact. She rates her pain 3/10. She has not needed to take anything for the pain yet. Pt was given oxycodone  when she left the hospital yesterday, and hasn't had anything since.   Instructed to call office with any fever, chills, purulent drainage, uncontrolled pain or any other questions or concerns. Patient verbalizes understanding.   Pt aware of post op appointments as well as the office number [phone] and after hours number [phone] to call if she has any questions or concerns

## 2024-05-23 ENCOUNTER — Telehealth: Payer: Self-pay | Admitting: *Deleted

## 2024-05-23 NOTE — Telephone Encounter (Signed)
 Spoke with patient who called the office and left a message. Pt states she had been spotting lightly since her surgery on 12/3 and it was just about gone then this morning when using the bathroom pt had some heavier bright red vaginal bleeding without clots that happened just once. Pt denies straining and reports having regular bowel movements. Pt also denies fever, chills and or pain. Heather Bailey also states she has been following all activity restrictions, although states she bent over yesterday to pick something up off the floor. Pt also denies all urinary symptoms. She is wearing a pad and only changing for sanitary reasons a few times a day, and the pads have just spotting.   Advised patient to continue monitoring the bleeding and call with any changes or increased symptoms and her message will be relayed to providers. Pt thanked the office.

## 2024-05-25 ENCOUNTER — Inpatient Hospital Stay: Payer: Self-pay | Attending: Gynecologic Oncology | Admitting: Gynecologic Oncology

## 2024-05-25 ENCOUNTER — Encounter: Payer: Self-pay | Admitting: Gynecologic Oncology

## 2024-05-25 DIAGNOSIS — Z90722 Acquired absence of ovaries, bilateral: Secondary | ICD-10-CM

## 2024-05-25 DIAGNOSIS — Z9079 Acquired absence of other genital organ(s): Secondary | ICD-10-CM

## 2024-05-25 DIAGNOSIS — N83209 Unspecified ovarian cyst, unspecified side: Secondary | ICD-10-CM

## 2024-05-25 DIAGNOSIS — Z8742 Personal history of other diseases of the female genital tract: Secondary | ICD-10-CM

## 2024-05-25 DIAGNOSIS — D271 Benign neoplasm of left ovary: Secondary | ICD-10-CM

## 2024-05-25 DIAGNOSIS — R9389 Abnormal findings on diagnostic imaging of other specified body structures: Secondary | ICD-10-CM

## 2024-05-25 DIAGNOSIS — D270 Benign neoplasm of right ovary: Secondary | ICD-10-CM

## 2024-05-25 NOTE — Progress Notes (Signed)
 Gynecologic Oncology Telehealth Note: Gyn-Onc  I connected with Heather Bailey on 05/25/2024 at  4:45 PM EST by telephone and verified that I am speaking with the correct person using two identifiers.  I discussed the limitations, risks, security and privacy concerns of performing an evaluation and management service by telemedicine and the availability of in-person appointments. I also discussed with the patient that there may be a patient responsible charge related to this service. The patient expressed understanding and agreed to proceed.  Other persons participating in the visit and their role in the encounter: none.  Patient's location: home Provider's location: [facility], [place]  Reason for Visit: follow-up  Treatment History: The patient reports being followed for at least the last 3-4 years for cyst on her ovaries.  At the time that this was initially found, she did not have insurance.  Now she has insurance and is following with her OB/GYN regularly.   Pelvic ultrasound exam performed on 04/30/2021 at [facility] showed a uterus measuring 6.3 x 6.3 x 4.1 cm with an endometrial lining of 7.3 mm.  Left ovary measures up to 5.7 cm in size and right up to 5.2 cm in size.  Multiple cysts seen in bilateral ovaries, largest on the right measures up to 3.2 cm and on the left measures up to 3.5 cm.   Tumor markers were performed on 11/20/2021.  CEA was 1.9, CA 19-9 was <2 and CA-125 was mildly elevated at 54.9. Pelvic ultrasound exam at [facility] on 11/20/2021 showed a uterus measuring 5.8 x 6.8 x 4.3 cm with an endometrial lining of 3.1 mm.  Left ovary measures 5.3 x 4.3 x 4 cm.  Right ovary measures 4.4 x 3.3 x 2.6 cm.  There are multiple simple cyst seen bilaterally, the 3 largest on the right measuring up to 2.8 cm, along with a small projection within the cyst.  Left also with multiple simple cysts, the largest measuring up to 2.8 cm.  Multiple fibroids noted within the uterus, largest  measuring up to 2.9 cm.  Small amount of free fluid noted within the cul-de-sac.   Pelvic ultrasound 12/2021: 1. Multiple uterine fibroids. 2. Bilateral simple ovarian cysts. Multiple ovarian cysts, largest 4.3 centimeters. Recommend follow-up pelvic US  in 3-6 months. Note: This recommendation does not apply to premenarchal patients or to those with increased risk (genetic, family history, elevated tumor markers or other high-risk factors) of ovarian cancer. Reference: Radiology 2019 Nov; 293(2):359-371.   Pelvic ultrasound 04/2022: 1. Uterine fibroids, described above. 2. Findings suggest bilateral hydrosalpinx. No definite interval change, but comparison between the studies is difficult due to interoperator differences. 3. Endometrium not visualized. 4. The sonographic findings are indeterminate, and MRI of the pelvis might be helpful for further evaluation.   Pelvic MRI 08/2022: 1. Enlarged bilateral ovaries containing multiple cysts, measuring up to 3.1 cm on the left and 5.5 cm on the right. Apparent mural thickening along the posterolateral right ovary does not demonstrate associated restricted diffusion or definite enhancement and is favored to reflect a benign process. A 4 mm focus of nodular enhancement is seen along the posterior left ovary, which is indeterminate but may be artifactual related to volume averaging. Continued follow-up pelvic ultrasound in 6-12 months can be considered to assess for interval change, as clinically indicated. 2. Multiple intrauterine masses measuring up to 2.7 cm, likely leiomyomas. The endometrium is distorted, measuring 4 mm.   Pelvic ultrasound 03/2023: 1. No significant interval change in size or  appearance of septated cystic lesions within the bilateral ovaries. As noted on previous MR there are multiple irregular internal areas of septation, some of which appear thickened and containing increased blood flow. Findings are concerning for benign or  malignant ovarian neoplasms. 2. Small uterine fibroids. 3. Sub optimal visualization of the endometrial stripe which by me best estimate measures 8 mm. On the previous MRI there are no signs of endometrial thickening.   Pelvic ultrasound: 1. Septated right ovarian cystic lesion measuring 5.0 x 3.2 x 3.5 cm, decreased from prior; continued follow-up with ultrasound or MRI recommended if surgical resection is not planned . 2. Multiple left ovarian cysts with the largest containing internal echoes measuring 2.2 x 2.5 x 2.0 cm, decreased from prior; continued follow-up with ultrasound or MRI recommended if surgical resection is not planned. 3. Endometrial thickness of 8 mm in a postmenopausal patient, recommend gynecology evaluation and endometrial sampling. 4. Uterine fibroids as described.  05/17/24: Robotic-assisted laparoscopic bilateral salpingo-oophorectomy, endometrial biopsy, dilation and curettage with endometrial sampling under hysteroscopy   Interval History: Doing well, improving daily. Some cramping like having a cycle, slowly improving. Spotting intermittently. Bowels moving well. Urinating without issue.   Past Medical/Surgical History: Past Medical History:  Diagnosis Date   Anemia Dx 2003   Arthritis Dx 1999   Chronic kidney disease    Depression    DVT (deep venous thrombosis) (HCC) 2007   got Lupron to shrink fibroids, developed DVT after (blood thinner for 6 months)   Dyspnea    Fibroids    Gestational diabetes    Headache    Hyperlipidemia Dx 2000   Hypertension Dx 2000   Hypertensive retinopathy    Hypothyroidism    Thyroid  disease Dx 2005    Past Surgical History:  Procedure Laterality Date   CESAREAN SECTION     DILATION AND CURETTAGE OF UTERUS N/A 05/17/2024   Procedure: DILATION AND CURETTAGE;  Surgeon: Viktoria Comer SAUNDERS, MD;  Location: [facility];  Service: Gynecology;  Laterality: N/A;  VS. ENDOMETRIAL BIOPSY   MYOMECTOMY     x2-3   MYOMECTOMY      ROBOTIC ASSISTED BILATERAL SALPINGO OOPHERECTOMY Bilateral 05/17/2024   Procedure: SALPINGO-OOPHORECTOMY, BILATERAL, ROBOT-ASSISTED;  Surgeon: Viktoria Comer SAUNDERS, MD;  Location: [facility];  Service: Gynecology;  Laterality: Bilateral;  POSSIBLE STAGING, POSSIBLE LAPAROTOMY   TUBAL LIGATION     at c-section    Family History  Problem Relation Age of Onset   Hypertension Mother    Stomach cancer Mother    Alzheimer's disease Sister    Colon cancer Neg Hx    Colon polyps Neg Hx    Esophageal cancer Neg Hx    Rectal cancer Neg Hx     Social History   Socioeconomic History   Marital status: Married    Spouse name: Not on file   Number of children: 2   Years of education: Not on file   Highest education level: Bachelor's degree (e.g., BA, AB, BS)  Occupational History   Occupation: Tool crib assistant  Tobacco Use   Smoking status: Never   Smokeless tobacco: Never  Vaping Use   Vaping status: Never Used  Substance and Sexual Activity   Alcohol use: No   Drug use: No   Sexual activity: Not Currently    Birth control/protection: Surgical  Other Topics Concern   Not on file  Social History Narrative   Lives with husband who has diabetes.    Social Drivers of Health  Tobacco Use: Low Risk (05/17/2024)   Patient History    Smoking Tobacco Use: Never    Smokeless Tobacco Use: Never    Passive Exposure: Not on file  Financial Resource Strain: High Risk (04/17/2024)   Overall Financial Resource Strain (CARDIA)    Difficulty of Paying Living Expenses: Very hard  Food Insecurity: No Food Insecurity (04/17/2024)   Epic    Worried About Programme Researcher, Broadcasting/film/video in the Last Year: Never true    Ran Out of Food in the Last Year: Never true  Transportation Needs: No Transportation Needs (04/17/2024)   Epic    Lack of Transportation (Medical): No    Lack of Transportation (Non-Medical): No  Physical Activity: Insufficiently Active (04/17/2024)   Exercise Vital Sign    Days of Exercise per  Week: 4 days    Minutes of Exercise per Session: 30 min  Stress: No Stress Concern Present (04/17/2024)   [employer] of Occupational Health - Occupational Stress Questionnaire    Feeling of Stress: Only a little  Social Connections: Socially Integrated (04/17/2024)   Social Connection and Isolation Panel    Frequency of Communication with Friends and Family: Three times a week    Frequency of Social Gatherings with Friends and Family: Once a week    Attends Religious Services: More than 4 times per year    Active Member of Clubs or Organizations: Yes    Attends Banker Meetings: More than 4 times per year    Marital Status: Married  Depression (PHQ2-9): Low Risk (10/14/2023)   Depression (PHQ2-9)    PHQ-2 Score: 4  Alcohol Screen: Not on file  Housing: Low Risk (04/17/2024)   Epic    Unable to Pay for Housing in the Last Year: No    Number of Times Moved in the Last Year: 0    Homeless in the Last Year: No  Utilities: At Risk (04/16/2023)   [employer] Utilities    Threatened with loss of utilities: Yes  Health Literacy: Adequate Health Literacy (04/16/2023)   B1300 Health Literacy    Frequency of need for help with medical instructions: Never    Current Medications: Current Medications[1]  Review of Symptoms: Pertinent positives as per HPI.  Physical Exam: Deferred given limitations of phone visit.  Laboratory & Radiologic Studies: A. CERVICAL, 12:00 O'CLOCK,  BIOPSY: - Tangentially sectioned polypoid fragment of benign endocervical mucosa and underlying stroma. - Negative for dysplasia and malignancy.  B. ENDOMETRIAL, BIOPSY: - Predominantly blood with few fragments of benign endometrial stroma. - Negative for atypia/EIN and malignancy on this limited sample.  C. 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 - Fallopian tube with fimbriated end. - Negative for malignancy.  D. ENDOMETRIAL,  CURETTINGS: - Atrophic endometrium and underlying myometrium. - Negative for atypia/EIN and malignancy.  E. 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 - Fallopian tube with fimbriated end. - Negative for malignancy.   Assessment & Plan: Heather Bailey is a 63 y.o. woman s/p robotic BSO and D&C for complex adnexal masses, thickened endometrium. Benign pathology.  Doing well.  Meeting postoperative milestones.  Discussed continued expectations and restrictions.  Pathology reviewed with her.  I discussed the assessment and treatment plan with the patient. The patient was provided with an opportunity to ask questions and all were answered. The patient agreed with the plan and demonstrated an understanding of the instructions.   The patient  was advised to call back or see an in-person evaluation if the symptoms worsen or if the condition fails to improve as anticipated.   12 minutes of total time was spent for this patient encounter, including preparation, phone counseling with the patient and coordination of care, and documentation of the encounter.   Comer Dollar, MD  Division of Gynecologic Oncology  Department of Obstetrics and Gynecology  [facility]      [1]  Current Outpatient Medications:    atorvastatin  (LIPITOR) 40 MG tablet, Take 1 tablet (40 mg total) by mouth daily., Disp: 90 tablet, Rfl: 0   Blood Pressure Monitor DEVI, Use as directed to check home blood pressure 2-3 times a week, Disp: 1 each, Rfl: 0   glucose blood (TRUE METRIX BLOOD GLUCOSE TEST) test strip, Use as instructed to check blood sugar up to 3 times daily., Disp: 100 each, Rfl: 2   levothyroxine  (SYNTHROID ) 50 MCG tablet, Take 1 tablet (50 mcg total) by mouth daily.Please make an appointment., Disp: 90 tablet, Rfl: 1   losartan  (COZAAR ) 25 MG tablet, Take 1 tablet (25 mg total) by mouth daily., Disp: 90 tablet,  Rfl: 1   metFORMIN  (GLUCOPHAGE -XR) 500 MG 24 hr tablet, Take 2 tablets (1,000 mg total) by mouth daily., Disp: 180 tablet, Rfl: 1   metoprolol  succinate (TOPROL -XL) 25 MG 24 hr tablet, Take 1 tablet (25 mg total) by mouth daily., Disp: 90 tablet, Rfl: 1   Multiple Vitamin (MULTIVITAMIN ADULT PO), Take by mouth. (Patient not taking: Reported on 04/28/2024), Disp: , Rfl:    Polyvinyl Alcohol-Povidone (REFRESH OP), Place 1 drop into both eyes daily as needed (dry eyes)., Disp: , Rfl:    senna-docusate (SENOKOT-S) 8.6-50 MG tablet, Take 2 tablets by mouth at bedtime. For AFTER surgery, do not take if having diarrhea, Disp: 30 tablet, Rfl: 0   sertraline  (ZOLOFT ) 100 MG tablet, Take 1 tablet (100 mg total) by mouth daily., Disp: 90 tablet, Rfl: 1   SUMAtriptan  (IMITREX ) 50 MG tablet, 1 tablet by mouth at start of headache may repeat x1 in 2 hours if headache persists. Max 2 tablets/24hrs (Patient taking differently: 1 tablet by mouth at start of headache may repeat x1 in 2 hours if headache persists. Max 2 tablets/24hrs as needed), Disp: 10 tablet, Rfl: 2   topiramate  (TOPAMAX ) 50 MG tablet, Take 1 tablet (50 mg total) by mouth daily. Take at bedtime, Disp: 30 tablet, Rfl: 2   traMADol  (ULTRAM ) 50 MG tablet, Take 1 tablet (50 mg total) by mouth every 6 (six) hours as needed for moderate pain (pain score 4-6). For AFTER surgery only, do not take and drive, Disp: 10 tablet, Rfl: 0   TRUEPLUS LANCETS 28G MISC, Use to check blood sugar up to 3 times daily., Disp: 100 each, Rfl: 2

## 2024-06-05 ENCOUNTER — Other Ambulatory Visit: Payer: Self-pay | Admitting: Internal Medicine

## 2024-06-05 DIAGNOSIS — E039 Hypothyroidism, unspecified: Secondary | ICD-10-CM

## 2024-06-06 ENCOUNTER — Other Ambulatory Visit: Payer: Self-pay

## 2024-06-06 MED ORDER — LEVOTHYROXINE SODIUM 50 MCG PO TABS
50.0000 ug | ORAL_TABLET | Freq: Every day | ORAL | 1 refills | Status: AC
  Filled 2024-06-06: qty 90, 90d supply, fill #0

## 2024-06-12 ENCOUNTER — Other Ambulatory Visit: Payer: Self-pay | Admitting: Internal Medicine

## 2024-06-12 ENCOUNTER — Other Ambulatory Visit: Payer: Self-pay

## 2024-06-12 DIAGNOSIS — G43109 Migraine with aura, not intractable, without status migrainosus: Secondary | ICD-10-CM

## 2024-06-12 MED ORDER — TOPIRAMATE 50 MG PO TABS
50.0000 mg | ORAL_TABLET | Freq: Every day | ORAL | 2 refills | Status: AC
  Filled 2024-06-12: qty 30, 30d supply, fill #0
  Filled 2024-07-13: qty 30, 30d supply, fill #1

## 2024-06-19 ENCOUNTER — Telehealth: Payer: Self-pay

## 2024-06-19 NOTE — Telephone Encounter (Signed)
 Pt calling to f/u on home sleep test that she was to have sent to her. Please advise

## 2024-06-20 NOTE — Telephone Encounter (Signed)
**Note De-Identified [place] [facility] Obfuscation** Ordering provider: Dr Ren Associated diagnoses: Snoring-R06.83 and Somnolence-R40.0 WatchPAT PA obtained on 06/20/2024 by Tristian Sickinger, Avelina HERO, LPN. Authorization: Per the [facility] website, a PA is not required for a Itamar-HST (CPT Code: 04199).  Patient notified on 06/20/2024 Heather Bailey Notification Method: phone. The pt states that she plans to come to the office on Friday 06/23/2024 to pick up a WatchPAT One-HST Device with instructions on how to use it.

## 2024-06-22 NOTE — Telephone Encounter (Signed)
 Pt came into office to pick up Sleep Study, per Macario Via. Patient had to leave due to long wait time.   MyChart msg sent & routed to Sleep Study team for pt to pick up tomorrow & speak with clinical member regarding instructions.  JB, 06-22-24

## 2024-06-22 NOTE — Telephone Encounter (Signed)
 Patient was in the office to pick up her itamar home sleep test and had to leave before I could help her because when I registered her device it alerted me that the serial number ( [id number]) already existed. I was able to get another device registered ([id number]) for her but by then she was gone. I reached out to her and she says she will come back on Friday to get the device.

## 2024-06-23 ENCOUNTER — Encounter: Payer: Self-pay | Admitting: Gynecologic Oncology

## 2024-06-23 ENCOUNTER — Inpatient Hospital Stay: Payer: Self-pay | Attending: Gynecologic Oncology | Admitting: Gynecologic Oncology

## 2024-06-23 VITALS — BP 100/71 | HR 71 | Temp 99.0°F | Resp 18 | Wt 198.0 lb

## 2024-06-23 DIAGNOSIS — N83209 Unspecified ovarian cyst, unspecified side: Secondary | ICD-10-CM

## 2024-06-23 DIAGNOSIS — D270 Benign neoplasm of right ovary: Secondary | ICD-10-CM

## 2024-06-23 DIAGNOSIS — D271 Benign neoplasm of left ovary: Secondary | ICD-10-CM

## 2024-06-23 DIAGNOSIS — Z90722 Acquired absence of ovaries, bilateral: Secondary | ICD-10-CM

## 2024-06-23 DIAGNOSIS — R9389 Abnormal findings on diagnostic imaging of other specified body structures: Secondary | ICD-10-CM

## 2024-06-23 DIAGNOSIS — Z9079 Acquired absence of other genital organ(s): Secondary | ICD-10-CM

## 2024-06-23 NOTE — Telephone Encounter (Signed)
 Patient came back today to get her itamar sleep study device. All instructions were given and patient is agreeable to treatment.

## 2024-06-23 NOTE — Progress Notes (Addendum)
 Gynecologic Oncology Return Clinic Visit  06/23/2024  Reason for Visit: follow-up  Treatment History: The patient reports being followed for at least the last 3-4 years for cyst on her ovaries.  At the time that this was initially found, she did not have insurance.  Now she has insurance and is following with her OB/GYN regularly.   Pelvic ultrasound exam performed on 04/30/2021 at [facility] showed a uterus measuring 6.3 x 6.3 x 4.1 cm with an endometrial lining of 7.3 mm.  Left ovary measures up to 5.7 cm in size and right up to 5.2 cm in size.  Multiple cysts seen in bilateral ovaries, largest on the right measures up to 3.2 cm and on the left measures up to 3.5 cm.   Tumor markers were performed on 11/20/2021.  CEA was 1.9, CA 19-9 was <2 and CA-125 was mildly elevated at 54.9. Pelvic ultrasound exam at [facility] on 11/20/2021 showed a uterus measuring 5.8 x 6.8 x 4.3 cm with an endometrial lining of 3.1 mm.  Left ovary measures 5.3 x 4.3 x 4 cm.  Right ovary measures 4.4 x 3.3 x 2.6 cm.  There are multiple simple cyst seen bilaterally, the 3 largest on the right measuring up to 2.8 cm, along with a small projection within the cyst.  Left also with multiple simple cysts, the largest measuring up to 2.8 cm.  Multiple fibroids noted within the uterus, largest measuring up to 2.9 cm.  Small amount of free fluid noted within the cul-de-sac.   Pelvic ultrasound 12/2021: 1. Multiple uterine fibroids. 2. Bilateral simple ovarian cysts. Multiple ovarian cysts, largest 4.3 centimeters. Recommend follow-up pelvic US  in 3-6 months. Note: This recommendation does not apply to premenarchal patients or to those with increased risk (genetic, family history, elevated tumor markers or other high-risk factors) of ovarian cancer. Reference: Radiology 2019 Nov; 293(2):359-371.   Pelvic ultrasound 04/2022: 1. Uterine fibroids, described above. 2. Findings suggest bilateral hydrosalpinx. No definite  interval change, but comparison between the studies is difficult due to interoperator differences. 3. Endometrium not visualized. 4. The sonographic findings are indeterminate, and MRI of the pelvis might be helpful for further evaluation.   Pelvic MRI 08/2022: 1. Enlarged bilateral ovaries containing multiple cysts, measuring up to 3.1 cm on the left and 5.5 cm on the right. Apparent mural thickening along the posterolateral right ovary does not demonstrate associated restricted diffusion or definite enhancement and is favored to reflect a benign process. A 4 mm focus of nodular enhancement is seen along the posterior left ovary, which is indeterminate but may be artifactual related to volume averaging. Continued follow-up pelvic ultrasound in 6-12 months can be considered to assess for interval change, as clinically indicated. 2. Multiple intrauterine masses measuring up to 2.7 cm, likely leiomyomas. The endometrium is distorted, measuring 4 mm.   Pelvic ultrasound 03/2023: 1. No significant interval change in size or appearance of septated cystic lesions within the bilateral ovaries. As noted on previous MR there are multiple irregular internal areas of septation, some of which appear thickened and containing increased blood flow. Findings are concerning for benign or malignant ovarian neoplasms. 2. Small uterine fibroids. 3. Sub optimal visualization of the endometrial stripe which by me best estimate measures 8 mm. On the previous MRI there are no signs of endometrial thickening.   Pelvic ultrasound: 1. Septated right ovarian cystic lesion measuring 5.0 x 3.2 x 3.5 cm, decreased from prior; continued follow-up with ultrasound or MRI recommended if  surgical resection is not planned . 2. Multiple left ovarian cysts with the largest containing internal echoes measuring 2.2 x 2.5 x 2.0 cm, decreased from prior; continued follow-up with ultrasound or MRI recommended if surgical resection is not  planned. 3. Endometrial thickness of 8 mm in a postmenopausal patient, recommend gynecology evaluation and endometrial sampling. 4. Uterine fibroids as described.  05/17/24: Robotic-assisted laparoscopic bilateral salpingo-oophorectomy, endometrial biopsy, dilation and curettage with endometrial sampling under hysteroscopy   Interval History: Doing well.  Has intermittent soreness in her pelvis when she sits, otherwise denies abdominal pain.  Denies any bleeding since surgery.  Reports baseline bowel and bladder function.  Past Medical/Surgical History: Past Medical History:  Diagnosis Date   Anemia Dx 2003   Arthritis Dx 1999   Chronic kidney disease    Depression    DVT (deep venous thrombosis) (HCC) 2007   got Lupron to shrink fibroids, developed DVT after (blood thinner for 6 months)   Dyspnea    Fibroids    Gestational diabetes    Headache    Hyperlipidemia Dx 2000   Hypertension Dx 2000   Hypertensive retinopathy    Hypothyroidism    Thyroid  disease Dx 2005    Past Surgical History:  Procedure Laterality Date   CESAREAN SECTION     DILATION AND CURETTAGE OF UTERUS N/A 05/17/2024   Procedure: DILATION AND CURETTAGE;  Surgeon: Viktoria Comer SAUNDERS, MD;  Location: [facility];  Service: Gynecology;  Laterality: N/A;  VS. ENDOMETRIAL BIOPSY   MYOMECTOMY     x2-3   MYOMECTOMY     ROBOTIC ASSISTED BILATERAL SALPINGO OOPHERECTOMY Bilateral 05/17/2024   Procedure: SALPINGO-OOPHORECTOMY, BILATERAL, ROBOT-ASSISTED;  Surgeon: Viktoria Comer SAUNDERS, MD;  Location: [facility];  Service: Gynecology;  Laterality: Bilateral;  POSSIBLE STAGING, POSSIBLE LAPAROTOMY   TUBAL LIGATION     at c-section    Family History  Problem Relation Age of Onset   Hypertension Mother    Stomach cancer Mother    Alzheimer's disease Sister    Colon cancer Neg Hx    Colon polyps Neg Hx    Esophageal cancer Neg Hx    Rectal cancer Neg Hx     Social History   Socioeconomic History   Marital status: Married     Spouse name: Not on file   Number of children: 2   Years of education: Not on file   Highest education level: Bachelor's degree (e.g., BA, AB, BS)  Occupational History   Occupation: Tool crib assistant  Tobacco Use   Smoking status: Never   Smokeless tobacco: Never  Vaping Use   Vaping status: Never Used  Substance and Sexual Activity   Alcohol use: No   Drug use: No   Sexual activity: Not Currently    Birth control/protection: Surgical  Other Topics Concern   Not on file  Social History Narrative   Lives with husband who has diabetes.    Social Drivers of Health   Tobacco Use: Low Risk (06/23/2024)   Patient History    Smoking Tobacco Use: Never    Smokeless Tobacco Use: Never    Passive Exposure: Not on file  Financial Resource Strain: High Risk (04/17/2024)   Overall Financial Resource Strain (CARDIA)    Difficulty of Paying Living Expenses: Very hard  Food Insecurity: No Food Insecurity (04/17/2024)   Epic    Worried About Programme Researcher, Broadcasting/film/video in the Last Year: Never true    Ran Out of Food in the Last Year: Never  true  Transportation Needs: No Transportation Needs (04/17/2024)   Epic    Lack of Transportation (Medical): No    Lack of Transportation (Non-Medical): No  Physical Activity: Insufficiently Active (04/17/2024)   Exercise Vital Sign    Days of Exercise per Week: 4 days    Minutes of Exercise per Session: 30 min  Stress: No Stress Concern Present (04/17/2024)   [employer] of Occupational Health - Occupational Stress Questionnaire    Feeling of Stress: Only a little  Social Connections: Socially Integrated (04/17/2024)   Social Connection and Isolation Panel    Frequency of Communication with Friends and Family: Three times a week    Frequency of Social Gatherings with Friends and Family: Once a week    Attends Religious Services: More than 4 times per year    Active Member of Clubs or Organizations: Yes    Attends Banker Meetings: More  than 4 times per year    Marital Status: Married  Depression (PHQ2-9): Low Risk (10/14/2023)   Depression (PHQ2-9)    PHQ-2 Score: 4  Alcohol Screen: Not on file  Housing: Low Risk (04/17/2024)   Epic    Unable to Pay for Housing in the Last Year: No    Number of Times Moved in the Last Year: 0    Homeless in the Last Year: No  Utilities: At Risk (04/16/2023)   [employer] Utilities    Threatened with loss of utilities: Yes  Health Literacy: Adequate Health Literacy (04/16/2023)   B1300 Health Literacy    Frequency of need for help with medical instructions: Never    Current Medications: Current Medications[1]  Review of Systems: + pelvic pain, back pain, joint pain Denies appetite changes, fevers, chills, fatigue, unexplained weight changes. Denies hearing loss, neck lumps or masses, mouth sores, ringing in ears or voice changes. Denies cough or wheezing.  Denies shortness of breath. Denies chest pain or palpitations. Denies leg swelling. Denies abdominal distention, pain, blood in stools, constipation, diarrhea, nausea, vomiting, or early satiety. Denies pain with intercourse, dysuria, frequency, hematuria or incontinence. Denies hot flashes, vaginal bleeding or vaginal discharge.   Denies muscle pain/cramps. Denies itching, rash, or wounds. Denies dizziness, headaches, numbness or seizures. Denies swollen lymph nodes or glands, denies easy bruising or bleeding. Denies anxiety, depression, confusion, or decreased concentration.  Physical Exam: BP 100/71 (BP Location: Right Arm, Patient Position: Sitting)   Pulse 71   Temp 99 F (37.2 C) (Oral)   Resp 18   Wt 198 lb (89.8 kg)   SpO2 99%   BMI 31.96 kg/m  General: Alert, oriented, no acute distress. HEENT: Posterior oropharynx clear, sclera anicteric. Chest: Unlabored breathing on room air. Abdomen: soft, nontender.  Normoactive bowel sounds.  No masses or hepatosplenomegaly appreciated.  Well-healed incisions. Extremities:  Grossly normal range of motion.  Warm, well perfused.  No edema bilaterally.  Laboratory & Radiologic Studies: A. CERVICAL, 12:00 O'CLOCK,  BIOPSY: - Tangentially sectioned polypoid fragment of benign endocervical mucosa and underlying stroma. - Negative for dysplasia and malignancy.  B. ENDOMETRIAL, BIOPSY: - Predominantly blood with few fragments of benign endometrial stroma. - Negative for atypia/EIN and malignancy on this limited sample.  C. 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 - Fallopian tube with fimbriated end. - Negative for malignancy.  D. ENDOMETRIAL, CURETTINGS: - Atrophic endometrium and underlying myometrium. - Negative for atypia/EIN and malignancy.  E. FALLOPIAN TUBE, OVARY, LEFT, SALPINGO OOPHORECTOMY: - Ovary with serous  cystadenoma and cystic endometriosis. - Fallopian tube with fimbriated end. - Negative for malignancy.   Cytology: no malignant cells  Assessment & Plan: Heather Bailey is a 64 y.o. woman s/p robotic BSO, endometrial sampling in the setting of complex adnexal masses and thickened endometrium.  Benign bilateral adnexa, atrophic endometrium.  Patient is overall doing well after surgery.  Reviewed continued expectations.  Reviewed pictures taken during the surgery before and after removal of her tubes and ovaries.  Discussed pathology with her.  She was given a copy of her pathology report.  She will be discharged back to her other medical providers.  16 minutes of total time was spent for this patient encounter, including preparation, face-to-face counseling with the patient and coordination of care, and documentation of the encounter.  Comer Dollar, MD  Division of Gynecologic Oncology  Department of Obstetrics and Gynecology  [facility]      [1]  Current Outpatient Medications:    atorvastatin  (LIPITOR) 40 MG tablet, Take 1 tablet (40  mg total) by mouth daily., Disp: 90 tablet, Rfl: 0   topiramate  (TOPAMAX ) 50 MG tablet, Take 1 tablet (50 mg total) by mouth daily. Take at bedtime, Disp: 30 tablet, Rfl: 2   traMADol  (ULTRAM ) 50 MG tablet, Take 1 tablet (50 mg total) by mouth every 6 (six) hours as needed for moderate pain (pain score 4-6). For AFTER surgery only, do not take and drive, Disp: 10 tablet, Rfl: 0   TRUEPLUS LANCETS 28G MISC, Use to check blood sugar up to 3 times daily., Disp: 100 each, Rfl: 2   Blood Pressure Monitor DEVI, Use as directed to check home blood pressure 2-3 times a week, Disp: 1 each, Rfl: 0   glucose blood (TRUE METRIX BLOOD GLUCOSE TEST) test strip, Use as instructed to check blood sugar up to 3 times daily., Disp: 100 each, Rfl: 2   levothyroxine  (SYNTHROID ) 50 MCG tablet, Take 1 tablet (50 mcg total) by mouth daily., Disp: 90 tablet, Rfl: 1   losartan  (COZAAR ) 25 MG tablet, Take 1 tablet (25 mg total) by mouth daily., Disp: 90 tablet, Rfl: 1   metFORMIN  (GLUCOPHAGE -XR) 500 MG 24 hr tablet, Take 2 tablets (1,000 mg total) by mouth daily., Disp: 180 tablet, Rfl: 1   metoprolol  succinate (TOPROL -XL) 25 MG 24 hr tablet, Take 1 tablet (25 mg total) by mouth daily., Disp: 90 tablet, Rfl: 1   Multiple Vitamin (MULTIVITAMIN ADULT PO), Take by mouth. (Patient not taking: Reported on 04/28/2024), Disp: , Rfl:    Polyvinyl Alcohol-Povidone (REFRESH OP), Place 1 drop into both eyes daily as needed (dry eyes)., Disp: , Rfl:    senna-docusate (SENOKOT-S) 8.6-50 MG tablet, Take 2 tablets by mouth at bedtime. For AFTER surgery, do not take if having diarrhea, Disp: 30 tablet, Rfl: 0   sertraline  (ZOLOFT ) 100 MG tablet, Take 1 tablet (100 mg total) by mouth daily., Disp: 90 tablet, Rfl: 1   SUMAtriptan  (IMITREX ) 50 MG tablet, 1 tablet by mouth at start of headache may repeat x1 in 2 hours if headache persists. Max 2 tablets/24hrs (Patient taking differently: 1 tablet by mouth at start of headache may repeat x1 in 2  hours if headache persists. Max 2 tablets/24hrs as needed), Disp: 10 tablet, Rfl: 2

## 2024-06-23 NOTE — Patient Instructions (Signed)
 It was great to see you today.  You are recovering very well from surgery!  Please do not hesitate to reach out in the future if you need anything.

## 2024-06-26 NOTE — Telephone Encounter (Signed)
 Patient came back today to get her itamar sleep study device. All instructions were given and patient is agreeable to treatment.

## 2024-07-13 ENCOUNTER — Other Ambulatory Visit: Payer: Self-pay

## 2024-07-18 NOTE — Progress Notes (Unsigned)
" °   °  °  Cardiology Office Note Date:  07/18/2024  ID:  Heather Bailey, DOB [birth year], MRN [id number] PCP:  Vicci Barnie NOVAK, MD  Cardiologist:  Joelle VEAR Ren Donley, MD  No chief complaint on file.     Problems Pre op for salpingo-oophorectomy  TTE 11/25: 55% NST 12/25: Low risk and no CAC ASCVD 11% LDL 96 9/25, HA1C 6.5 10/24 M: AN40, LN25, XL25  Visits  11/25: Exercise Myoview , 2D echo, HA1C/LP in 3 months 2/26: LP, re-order sleep study, CAC     ROS: Otherwise negative Discussed the use of AI scribe software for clinical note transcription with the patient, who gave verbal consent to proceed.  History of Present Illness     Physical Exam VS:  There were no vitals taken for this visit. , BMI There is no height or weight on file to calculate BMI. GEN: Well nourished, well developed, in no acute distress HEENT: normal Neck: no JVD, carotid bruits, or masses Cardiac: ***RRR; no murmurs, rubs, or gallops,no edema  Respiratory:  CTAB bilaterally, normal work of breathing GI: soft, nontender, nondistended, + BS Extremities: No LE edema Skin: warm and dry, no rash Neuro:  Strength and sensation are intact  Recent Labs: Reviewed  Assessment & Plan      Signed, Joelle VEAR Ren Donley, MD  07/18/2024 9:55 AM    [facility] "

## 2024-08-01 ENCOUNTER — Ambulatory Visit

## 2024-08-01 DIAGNOSIS — E119 Type 2 diabetes mellitus without complications: Secondary | ICD-10-CM

## 2024-08-01 DIAGNOSIS — I10 Essential (primary) hypertension: Secondary | ICD-10-CM

## 2024-08-01 DIAGNOSIS — E785 Hyperlipidemia, unspecified: Secondary | ICD-10-CM

## 2024-08-17 ENCOUNTER — Ambulatory Visit: Payer: Self-pay | Admitting: Internal Medicine
